# Patient Record
Sex: Female | Born: 1950 | Race: Black or African American | Hispanic: No | Smoking: Never smoker
Health system: Southern US, Community
[De-identification: ages and names within clinical notes are randomized; demographics above are authoritative.]

## PROBLEM LIST (undated history)

## (undated) ENCOUNTER — Emergency Department (HOSPITAL_COMMUNITY): Admission: EM | Payer: PRIVATE HEALTH INSURANCE | Source: Home / Self Care

## (undated) DIAGNOSIS — K2270 Barrett's esophagus without dysplasia: Secondary | ICD-10-CM

## (undated) DIAGNOSIS — K219 Gastro-esophageal reflux disease without esophagitis: Secondary | ICD-10-CM

## (undated) DIAGNOSIS — I5020 Unspecified systolic (congestive) heart failure: Secondary | ICD-10-CM

## (undated) DIAGNOSIS — K209 Esophagitis, unspecified: Secondary | ICD-10-CM

## (undated) DIAGNOSIS — Z8489 Family history of other specified conditions: Secondary | ICD-10-CM

## (undated) DIAGNOSIS — I10 Essential (primary) hypertension: Secondary | ICD-10-CM

## (undated) DIAGNOSIS — I255 Ischemic cardiomyopathy: Secondary | ICD-10-CM

## (undated) DIAGNOSIS — D589 Hereditary hemolytic anemia, unspecified: Secondary | ICD-10-CM

## (undated) DIAGNOSIS — N393 Stress incontinence (female) (male): Secondary | ICD-10-CM

## (undated) DIAGNOSIS — E785 Hyperlipidemia, unspecified: Secondary | ICD-10-CM

## (undated) DIAGNOSIS — I469 Cardiac arrest, cause unspecified: Secondary | ICD-10-CM

## (undated) DIAGNOSIS — I219 Acute myocardial infarction, unspecified: Secondary | ICD-10-CM

## (undated) DIAGNOSIS — G90529 Complex regional pain syndrome I of unspecified lower limb: Secondary | ICD-10-CM

## (undated) DIAGNOSIS — Z9109 Other allergy status, other than to drugs and biological substances: Secondary | ICD-10-CM

## (undated) DIAGNOSIS — M1710 Unilateral primary osteoarthritis, unspecified knee: Secondary | ICD-10-CM

## (undated) DIAGNOSIS — I2510 Atherosclerotic heart disease of native coronary artery without angina pectoris: Secondary | ICD-10-CM

## (undated) HISTORY — PX: OTHER SURGICAL HISTORY: SHX169

## (undated) HISTORY — PX: TONSILLECTOMY: SUR1361

## (undated) HISTORY — PX: APPENDECTOMY: SHX54

---

## 1997-06-07 HISTORY — PX: ABDOMINAL HYSTERECTOMY: SHX81

## 2000-03-07 ENCOUNTER — Emergency Department (HOSPITAL_COMMUNITY): Admission: EM | Admit: 2000-03-07 | Discharge: 2000-03-07 | Payer: Self-pay | Admitting: Emergency Medicine

## 2000-03-07 ENCOUNTER — Encounter: Payer: Self-pay | Admitting: Emergency Medicine

## 2000-05-14 ENCOUNTER — Emergency Department (HOSPITAL_COMMUNITY): Admission: EM | Admit: 2000-05-14 | Discharge: 2000-05-14 | Payer: Self-pay | Admitting: Emergency Medicine

## 2000-05-14 ENCOUNTER — Encounter: Payer: Self-pay | Admitting: Emergency Medicine

## 2000-06-05 ENCOUNTER — Inpatient Hospital Stay (HOSPITAL_COMMUNITY): Admission: EM | Admit: 2000-06-05 | Discharge: 2000-06-06 | Payer: Self-pay

## 2000-06-05 ENCOUNTER — Encounter: Payer: Self-pay | Admitting: Emergency Medicine

## 2000-12-03 ENCOUNTER — Emergency Department (HOSPITAL_COMMUNITY): Admission: EM | Admit: 2000-12-03 | Discharge: 2000-12-03 | Payer: Self-pay | Admitting: Emergency Medicine

## 2001-06-16 ENCOUNTER — Emergency Department (HOSPITAL_COMMUNITY): Admission: EM | Admit: 2001-06-16 | Discharge: 2001-06-16 | Payer: Self-pay

## 2002-02-14 ENCOUNTER — Emergency Department (HOSPITAL_COMMUNITY): Admission: EM | Admit: 2002-02-14 | Discharge: 2002-02-14 | Payer: Self-pay | Admitting: Emergency Medicine

## 2003-01-27 ENCOUNTER — Encounter: Payer: Self-pay | Admitting: Emergency Medicine

## 2003-01-27 ENCOUNTER — Emergency Department (HOSPITAL_COMMUNITY): Admission: EM | Admit: 2003-01-27 | Discharge: 2003-01-27 | Payer: Self-pay | Admitting: Emergency Medicine

## 2003-03-02 ENCOUNTER — Encounter: Payer: Self-pay | Admitting: Emergency Medicine

## 2003-03-02 ENCOUNTER — Emergency Department (HOSPITAL_COMMUNITY): Admission: EM | Admit: 2003-03-02 | Discharge: 2003-03-02 | Payer: Self-pay | Admitting: Emergency Medicine

## 2003-08-05 ENCOUNTER — Encounter: Admission: RE | Admit: 2003-08-05 | Discharge: 2003-08-05 | Payer: Self-pay | Admitting: Obstetrics

## 2003-11-02 ENCOUNTER — Emergency Department (HOSPITAL_COMMUNITY): Admission: EM | Admit: 2003-11-02 | Discharge: 2003-11-02 | Payer: Self-pay | Admitting: Family Medicine

## 2003-12-20 ENCOUNTER — Emergency Department (HOSPITAL_COMMUNITY): Admission: EM | Admit: 2003-12-20 | Discharge: 2003-12-20 | Payer: Self-pay | Admitting: Family Medicine

## 2004-07-02 ENCOUNTER — Ambulatory Visit: Payer: Self-pay | Admitting: Family Medicine

## 2004-08-08 ENCOUNTER — Emergency Department (HOSPITAL_COMMUNITY): Admission: EM | Admit: 2004-08-08 | Discharge: 2004-08-08 | Payer: Self-pay | Admitting: Emergency Medicine

## 2004-08-11 ENCOUNTER — Ambulatory Visit: Payer: Self-pay | Admitting: Family Medicine

## 2004-12-01 ENCOUNTER — Encounter: Admission: RE | Admit: 2004-12-01 | Discharge: 2004-12-01 | Payer: Self-pay | Admitting: Obstetrics and Gynecology

## 2007-10-24 ENCOUNTER — Encounter: Admission: RE | Admit: 2007-10-24 | Discharge: 2007-10-24 | Payer: Self-pay | Admitting: Obstetrics and Gynecology

## 2008-05-12 ENCOUNTER — Emergency Department (HOSPITAL_BASED_OUTPATIENT_CLINIC_OR_DEPARTMENT_OTHER): Admission: EM | Admit: 2008-05-12 | Discharge: 2008-05-12 | Payer: Self-pay | Admitting: Emergency Medicine

## 2008-05-13 ENCOUNTER — Ambulatory Visit: Payer: Self-pay | Admitting: Diagnostic Radiology

## 2008-05-13 ENCOUNTER — Ambulatory Visit (HOSPITAL_BASED_OUTPATIENT_CLINIC_OR_DEPARTMENT_OTHER): Admission: RE | Admit: 2008-05-13 | Discharge: 2008-05-13 | Payer: Self-pay | Admitting: Emergency Medicine

## 2009-05-25 ENCOUNTER — Emergency Department (HOSPITAL_BASED_OUTPATIENT_CLINIC_OR_DEPARTMENT_OTHER): Admission: EM | Admit: 2009-05-25 | Discharge: 2009-05-25 | Payer: Self-pay | Admitting: Emergency Medicine

## 2009-06-19 ENCOUNTER — Ambulatory Visit: Payer: Self-pay | Admitting: Diagnostic Radiology

## 2009-06-19 ENCOUNTER — Emergency Department (HOSPITAL_BASED_OUTPATIENT_CLINIC_OR_DEPARTMENT_OTHER): Admission: EM | Admit: 2009-06-19 | Discharge: 2009-06-19 | Payer: Self-pay | Admitting: Emergency Medicine

## 2009-11-06 ENCOUNTER — Encounter: Admission: RE | Admit: 2009-11-06 | Discharge: 2009-11-06 | Payer: Self-pay | Admitting: Family Medicine

## 2010-01-26 ENCOUNTER — Emergency Department (HOSPITAL_BASED_OUTPATIENT_CLINIC_OR_DEPARTMENT_OTHER): Admission: EM | Admit: 2010-01-26 | Discharge: 2010-01-26 | Payer: Self-pay | Admitting: Emergency Medicine

## 2010-04-07 HISTORY — PX: CHOLECYSTECTOMY: SHX55

## 2010-04-13 ENCOUNTER — Emergency Department (HOSPITAL_BASED_OUTPATIENT_CLINIC_OR_DEPARTMENT_OTHER): Admission: EM | Admit: 2010-04-13 | Discharge: 2010-04-13 | Payer: Self-pay | Admitting: Emergency Medicine

## 2010-04-13 ENCOUNTER — Ambulatory Visit: Payer: Self-pay | Admitting: Diagnostic Radiology

## 2010-04-23 ENCOUNTER — Ambulatory Visit (HOSPITAL_COMMUNITY)
Admission: RE | Admit: 2010-04-23 | Discharge: 2010-04-24 | Payer: Self-pay | Source: Home / Self Care | Admitting: Surgery

## 2010-04-23 ENCOUNTER — Encounter (INDEPENDENT_AMBULATORY_CARE_PROVIDER_SITE_OTHER): Payer: Self-pay | Admitting: Surgery

## 2010-06-28 ENCOUNTER — Encounter: Payer: Self-pay | Admitting: Family Medicine

## 2010-06-28 ENCOUNTER — Encounter: Payer: Self-pay | Admitting: *Deleted

## 2010-06-28 ENCOUNTER — Encounter: Payer: Self-pay | Admitting: Obstetrics and Gynecology

## 2010-08-08 ENCOUNTER — Emergency Department (HOSPITAL_COMMUNITY)
Admission: EM | Admit: 2010-08-08 | Discharge: 2010-08-08 | Disposition: A | Discharge status: Home / Self Care | Payer: PRIVATE HEALTH INSURANCE | Attending: Emergency Medicine | Admitting: Emergency Medicine

## 2010-08-08 ENCOUNTER — Emergency Department (HOSPITAL_COMMUNITY): Payer: PRIVATE HEALTH INSURANCE

## 2010-08-08 DIAGNOSIS — I10 Essential (primary) hypertension: Secondary | ICD-10-CM | POA: Insufficient documentation

## 2010-08-08 DIAGNOSIS — K219 Gastro-esophageal reflux disease without esophagitis: Secondary | ICD-10-CM | POA: Insufficient documentation

## 2010-08-08 DIAGNOSIS — R0789 Other chest pain: Secondary | ICD-10-CM | POA: Insufficient documentation

## 2010-08-08 LAB — COMPREHENSIVE METABOLIC PANEL
AST: 16 U/L (ref 0–37)
Alkaline Phosphatase: 91 U/L (ref 39–117)
BUN: 12 mg/dL (ref 6–23)
GFR calc Af Amer: >60 mL/min (ref >60)
GFR calc non Af Amer: >60 mL/min (ref >60)
Glucose, Bld: 89 mg/dL (ref 70–99)
Potassium: 3.5 mEq/L (ref 3.5–5.1)
Total Bilirubin: 1 mg/dL (ref 0.3–1.2)

## 2010-08-08 LAB — POCT CARDIAC MARKERS
Myoglobin, poc: 45.1 ng/mL (ref 12–200)
Myoglobin, poc: 43.4 ng/mL (ref 12–200)
Troponin i, poc: <0.05 ng/mL (ref 0.00–0.09)

## 2010-08-08 LAB — CBC
HCT: 33.1 % — ABNORMAL LOW (ref 36.0–46.0)
Hemoglobin: 10.6 g/dL — ABNORMAL LOW (ref 12.0–15.0)
MCH: 28.2 pg (ref 26.0–34.0)
MCHC: 32 g/dL (ref 30.0–36.0)

## 2010-08-18 LAB — DIFFERENTIAL
Basophils Relative: 0 % (ref 0–1)
Eosinophils Relative: 1 % (ref 0–5)
Eosinophils Relative: 1 % (ref 0–5)
Lymphocytes Relative: 33 % (ref 12–46)
Lymphs Abs: 1.4 10*3/uL (ref 0.7–4.0)
Monocytes Absolute: 0.3 10*3/uL (ref 0.1–1.0)
Neutro Abs: 4.3 10*3/uL (ref 1.7–7.7)

## 2010-08-18 LAB — CBC
Hemoglobin: 11 g/dL — ABNORMAL LOW (ref 12.0–15.0)
MCH: 29.5 pg (ref 26.0–34.0)
MCHC: 34.2 g/dL (ref 30.0–36.0)
Platelets: 222 10*3/uL (ref 150–400)
Platelets: 238 10*3/uL (ref 150–400)
RBC: 3.49 MIL/uL — ABNORMAL LOW (ref 3.87–5.11)
RDW: 13 % (ref 11.5–15.5)
RDW: 14.3 % (ref 11.5–15.5)

## 2010-08-18 LAB — SURGICAL PCR SCREEN: MRSA, PCR: NEGATIVE

## 2010-08-18 LAB — COMPREHENSIVE METABOLIC PANEL
AST: 16 U/L (ref 0–37)
CO2: 26 mEq/L (ref 19–32)
CO2: 29 mEq/L (ref 19–32)
Calcium: 9.7 mg/dL (ref 8.4–10.5)
Calcium: 9.4 mg/dL (ref 8.4–10.5)
Chloride: 109 mEq/L (ref 96–112)
Creatinine, Ser: 0.8 mg/dL (ref 0.4–1.2)
GFR calc Af Amer: >60 mL/min (ref >60)
GFR calc Af Amer: >60 mL/min (ref >60)
GFR calc non Af Amer: >60 mL/min (ref >60)
GFR calc non Af Amer: >60 mL/min (ref >60)
Potassium: 3.5 mEq/L (ref 3.5–5.1)
Sodium: 148 mEq/L — ABNORMAL HIGH (ref 135–145)
Total Bilirubin: 0.6 mg/dL (ref 0.3–1.2)
Total Protein: 8.2 g/dL (ref 6.0–8.3)

## 2010-08-18 LAB — LIPASE, BLOOD: Lipase: 164 U/L (ref 23–300)

## 2010-08-21 LAB — COMPREHENSIVE METABOLIC PANEL
Alkaline Phosphatase: 132 U/L — ABNORMAL HIGH (ref 39–117)
BUN: 17 mg/dL (ref 6–23)
CO2: 30 mEq/L (ref 19–32)
Chloride: 106 mEq/L (ref 96–112)
Glucose, Bld: 130 mg/dL — ABNORMAL HIGH (ref 70–99)
Potassium: 3.2 mEq/L — ABNORMAL LOW (ref 3.5–5.1)
Total Bilirubin: 0.7 mg/dL (ref 0.3–1.2)

## 2010-08-21 LAB — DIFFERENTIAL
Basophils Absolute: 0 10*3/uL (ref 0.0–0.1)
Basophils Relative: 0 % (ref 0–1)
Neutro Abs: 3.9 10*3/uL (ref 1.7–7.7)
Neutrophils Relative %: 61 % (ref 43–77)

## 2010-08-21 LAB — CBC
HCT: 31.9 % — ABNORMAL LOW (ref 36.0–46.0)
MCV: 86.7 fL (ref 78.0–100.0)
RBC: 3.68 MIL/uL — ABNORMAL LOW (ref 3.87–5.11)
WBC: 6.4 10*3/uL (ref 4.0–10.5)

## 2010-08-23 LAB — COMPREHENSIVE METABOLIC PANEL
ALT: 24 U/L (ref 0–35)
AST: 25 U/L (ref 0–37)
Albumin: 4.6 g/dL (ref 3.5–5.2)
CO2: 29 mEq/L (ref 19–32)
Calcium: 9.7 mg/dL (ref 8.4–10.5)
Chloride: 104 mEq/L (ref 96–112)
GFR calc Af Amer: >60 mL/min (ref >60)
GFR calc non Af Amer: >60 mL/min (ref >60)
Sodium: 142 mEq/L (ref 135–145)

## 2010-08-23 LAB — CBC
MCHC: 34.2 g/dL (ref 30.0–36.0)
Platelets: 275 10*3/uL (ref 150–400)
RBC: 3.77 MIL/uL — ABNORMAL LOW (ref 3.87–5.11)
WBC: 6.1 10*3/uL (ref 4.0–10.5)

## 2010-08-23 LAB — POCT CARDIAC MARKERS
CKMB, poc: 2.4 ng/mL (ref 1.0–8.0)
CKMB, poc: 2.8 ng/mL (ref 1.0–8.0)
Myoglobin, poc: 40.6 ng/mL (ref 12–200)
Troponin i, poc: 0.07 ng/mL (ref 0.00–0.09)
Troponin i, poc: <0.05 ng/mL (ref 0.00–0.09)

## 2010-08-23 LAB — D-DIMER, QUANTITATIVE: D-Dimer, Quant: 0.3 ug/mL-FEU (ref 0.00–0.48)

## 2010-08-23 LAB — DIFFERENTIAL
Eosinophils Absolute: 0.1 10*3/uL (ref 0.0–0.7)
Eosinophils Relative: 2 % (ref 0–5)
Lymphs Abs: 2.1 10*3/uL (ref 0.7–4.0)
Monocytes Absolute: 0.5 10*3/uL (ref 0.1–1.0)

## 2010-10-23 NOTE — Discharge Summary (Signed)
[place]  Patient:    Wendy Reid, Wendy Reid                   MRN: [id number] Adm. Date:  [id number] Disc. Date: [id number] Attending:  Farley Ly Dictator:   Susie Cassette, M.D. CC:         Wendy Reid, M.D.  Internal Medicine Clinic   Discharge Summary  DISCHARGE DIAGNOSES: 1. Chest pain, ruled out for myocardial infarction by enzymes and    electrocardiogram.  Likely secondary to recent upper respiratory tract    infection and coughing. 2. Anemia. 3. Hypertension. 4. Gastroesophageal reflux disease.  DISCHARGE MEDICATIONS: 1. Estrogen 0.625 mg p.o. q.d. 2. Aspirin 325 mg p.o. q.d. 3. Calan 240 mg p.o. q.d. 4. Nitroglycerin 0.4 mg sublingual every five minutes x 3 p.r.n. chest pain.  FOLLOW-UP APPOINTMENT:  1. Wendy Reid was instructed to follow up with the internal medicine outpatient clinic.  She was instructed to call as the clinics were closed on the day of discharge and get an appointment early the week of June 13, 2000, or June 14, 2000.  2. Wendy Reid was also given the number to call to set up a cardiology outpatient visit, as well as an outpatient Cardiolite.  This clinic was closed as well, so she was given the numbers to call to set those appointments.  PROCEDURES:  None.  CONSULTATIONS:  [facility] on June 06, 2000.  CHIEF COMPLAINT:  Chest pressure.  HISTORY OF PRESENT ILLNESS:  This is a 60 year old black female with a past medical history of hypertension and anemia, who presented with a two-day history of arm pressure and a one-day history of chest pressure. Wendy Reid does describe a past episode of chest pressure that was much more severe than this episode.  At that time, she was found to be anemic secondary to increased menstrual bleeding.  She had a hysterectomy done at that time and the problem was considered to be resolved.  She had  also had a catheterization at that time that was negative per the patient.  On Friday, she began having pressure in her left arm only.  On Saturday, the pressure was also in her chest.  She described it as beginning at her sternum and moving around her side towards the back.  She states that the pain came on suddenly and was not relieved with rest.  She denies frank pain, as well as shortness of breath, nausea, vomiting, and diaphoresis associated with the pressure. She describes the pressure as a 5/10 that waxed and waned somewhat, but was never completely resolved.  Her pressure in the ER was at a level of 0/10. She did receive nitroglycerin x 3, but was unsure of any symptomatic relief. She does report a slight increase in symptoms with deep breathing and denies any point tenderness.  PAST MEDICAL HISTORY:  1. Hypertension.  2. Gastroesophageal reflux disease. 3. Anemia.  4. Euthyroid with a questionable history of goiter.  PAST SURGICAL HISTORY:  1. Tonsillectomy.  2. Appendectomy.  3. Hysterectomy. 4. Knee surgery.  MEDICATIONS: 1. Estrogen 0.625 mg q.d. 2. Calan 240 mg p.o. q.d. 3. Albuterol p.r.n.  ALLERGIES:  No known drug allergies.  REVIEW OF SYSTEMS:  Positive for a cough since Thanksgiving with yellow phlegm that is now resolved.  Negative for visual or hearing changes, shortness of breath, palpitations, PND, orthopnea, nausea, vomiting, diarrhea, constipation, melena, dysuria, bright red blood per rectum, and known  hypercholesterolemia.  SOCIAL HISTORY:  Wendy [place] in [place]  She is widowed with four children, three boys and a girl.  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  She denies tobacco, alcohol, or illicit drug use.  She does not report any notable increase in life stressors.  FAMILY MEDICAL HISTORY:  Her mom died at 53 from an MI.  She had diabetes and hypertension.  Dad passed  away of a respiratory arrest secondary to trauma in his late 45s.  Siblings:  One brother with diabetes.  PHYSICAL EXAMINATION:  VITAL SIGNS:  Temperature 98.1 degrees, BP 148/86, pulse 83, respiratory rate 16, saturations 99%.  GENERAL APPEARANCE:  This is an obese black female in no acute distress.  HEENT:  Pupils are equally round and reactive to light.  Extraocular muscles intact.  Oropharynx clear without erythema.  Mucous membranes moist.  NECK:  No JVD.  No carotid bruits.  CARDIOVASCULAR:  Regular rate and rhythm.  No murmurs, rubs, or gallops.  LUNGS:  Clear to auscultation bilaterally.  No wheezes, crackles, or rhonchi.  ABDOMEN:  Bowel sounds present.  Soft, nondistended, nontender.  EXTREMITIES:  No edema in the lower extremities bilaterally.  Dorsalis pedis pulses 2+ bilaterally.  NEUROLOGIC:  No focal deficits.  Moves all extremities well.  RECTAL:  Stool in vault, heme negative.  ADMISSION LABORATORIES:  White blood cell count 5.4, hemoglobin 11.2, hematocrit 32.2, MCV 81.1, platelets 269.  CK 206, MB 2.0, troponin I less than 0.01.  PT 29, PTT 11.6, INR 0.8.  BMP:  Sodium 141, potassium 3.6, chloride 106, bicarbonate 24, BUN 16, creatinine 0.8, glucose 99, calcium 9.1.  Chest x-ray:  No effusion or infiltrate.  No acute disease.  EKG:  Normal sinus rhythm without ST-T wave changes.  HOSPITAL COURSE: #1 - CHEST PAIN:  Wendy Reid was admitted for observation and to rule out acute MI with serial enzymes and EKG.  Her enzyme values were negative with CKs of 206, 180, and 185, MBs of 2.0, 1.6, and 1.3, and troponin Is of less than 0.1 x 3.  In addition, her EKG showed no acute ST-T wave changes. Therefore, she was ruled out for MI.  She was managed overnight with Darvocet as needed for chest pain, as well as nitroglycerin.  She was started on aspirin and Lopressor, as well as Robitussin to decrease her symptoms related  to coughing.  It was felt that Ms.  Reid was likely not having an acute MI, but this was likely secondary to a recent upper respiratory infection with bouts of severe coughing that had led to some type of muscle strain and was causing her chest pressure.  However, because of her history of hypertension, as well as her family history of mom dying at 39 from MI, a cardiology consult was called in the morning.  Cardiology did see Wendy Reid and agreed with the work-up of this chest pressure as an outpatient.  Wendy Reid was given the number to schedule an outpatient Cardiolite, as well as an office visit.  She was discharged on an aspirin a day, as well as sublingual nitroglycerin in the event of chest pain.  #2 - HYPERTENSION:  Wendy Reid actually fairly well controlled on admission with a blood pressure of 148/86.  While in the hospital, she was managed with Lopressor 25 mg b.i.d.  However, on discharge, she was returned to her home regimen of Calan 240 mg q.d. as  an agent with better efficacy when used as a single agent.  However, per discussion with cardiology, if anything is found on the outpatient Cardiolite, a beta blocker will be considered for its morbidity and mortality benefits.  #3 - HYPERCHOLESTEROLEMIA:  A lipid panel was checked while Wendy Reid was in the hospital.  Her total cholesterol was noted to be 235, HDL 61, and LDL 153.  Per discussion with the patient, she prefers at least to try diet therapy prior to being started on a statin.  We discussed that she will try this for approximately three months, at which time she can have a cholesterol panel rechecked and further evaluation and treatment will be based on those results.  #4 - ANEMIA:  Wendy Reid reported that her anemia is of longstanding origin.  She states that she has been worked up extensively in the past with tests for diseases, including sickle cell, thalassemia, and G6PD.  She does report that her son has  G6PD.  Initial labs were checked while in house and revealed no abnormal morphology on a smear.  Her hemoglobin was stable while in the hospital and may need further work-up from her primary care M.D. as an outpatient.  However, her anemia is mild and has been worked up extensively in the past, per patient, so this may be something for Wendy Reid to discuss with her primary care physician and decided upon further evaluation and treatment.  #5 - HISTORY OF QUESTIONABLE GOITER:  Wendy Reid thyroid was tested while in the hospital and she is currently euthyroid.  So no further evaluation and treatment is needed.  DISCHARGE LABORATORIES:  CBC with white count 3.9, hemoglobin 11.4, MCV 81.8, platelets 258.  Sodium 138, potassium 3.5, chloride 105, bicarbonate 28, BUN 9, creatinine 0.8, glucose 98, calcium 8.9.  Smear with no abnormal morphology.  CK 185, MB 1.3, troponin I less than 0.01.  T4 1.14, TSH 0.893. Ferritin 94, B12 340.  Lipids with total cholesterol 235, triglycerides 105, HDL 61, and LDL 153.  EKG with normal sinus rhythm with no acute ST-T wave changes and question of slightly upsloping STs in V2 and V3.  DISPOSITION:  Wendy Reid was discharged home in good condition. DD:  06/10/00 TD:  06/10/00 Job: [id number]

## 2010-12-20 ENCOUNTER — Emergency Department (INDEPENDENT_AMBULATORY_CARE_PROVIDER_SITE_OTHER): Payer: PRIVATE HEALTH INSURANCE

## 2010-12-20 ENCOUNTER — Emergency Department (HOSPITAL_BASED_OUTPATIENT_CLINIC_OR_DEPARTMENT_OTHER)
Admission: EM | Admit: 2010-12-20 | Discharge: 2010-12-20 | Disposition: A | Discharge status: Home / Self Care | Payer: PRIVATE HEALTH INSURANCE | Attending: Emergency Medicine | Admitting: Emergency Medicine

## 2010-12-20 ENCOUNTER — Other Ambulatory Visit: Payer: Self-pay

## 2010-12-20 ENCOUNTER — Encounter: Payer: Self-pay | Admitting: *Deleted

## 2010-12-20 DIAGNOSIS — R079 Chest pain, unspecified: Secondary | ICD-10-CM

## 2010-12-20 DIAGNOSIS — R0789 Other chest pain: Secondary | ICD-10-CM | POA: Insufficient documentation

## 2010-12-20 LAB — COMPREHENSIVE METABOLIC PANEL
AST: 16 U/L (ref 0–37)
Albumin: 4.3 g/dL (ref 3.5–5.2)
Alkaline Phosphatase: 98 U/L (ref 39–117)
BUN: 14 mg/dL (ref 6–23)
Chloride: 104 mEq/L (ref 96–112)
Potassium: 3.5 mEq/L (ref 3.5–5.1)
Total Bilirubin: 0.6 mg/dL (ref 0.3–1.2)
Total Protein: 9.2 g/dL — ABNORMAL HIGH (ref 6.0–8.3)

## 2010-12-20 LAB — CBC
Hemoglobin: 10.9 g/dL — ABNORMAL LOW (ref 12.0–15.0)
MCH: 28.5 pg (ref 26.0–34.0)
MCHC: 33.5 g/dL (ref 30.0–36.0)
Platelets: 236 10*3/uL (ref 150–400)
RDW: 13.1 % (ref 11.5–15.5)

## 2010-12-20 LAB — DIFFERENTIAL
Basophils Absolute: 0 10*3/uL (ref 0.0–0.1)
Basophils Relative: 0 % (ref 0–1)
Eosinophils Absolute: 0.1 10*3/uL (ref 0.0–0.7)
Neutro Abs: 2.9 10*3/uL (ref 1.7–7.7)
Neutrophils Relative %: 62 % (ref 43–77)

## 2010-12-20 LAB — TROPONIN I: Troponin I: <0.3 ng/mL (ref <0.30)

## 2010-12-20 LAB — CK TOTAL AND CKMB (NOT AT ARMC)
CK, MB: 2.9 ng/mL (ref 0.3–4.0)
Total CK: 179 U/L — ABNORMAL HIGH (ref 7–177)

## 2010-12-20 MED ORDER — IBUPROFEN 800 MG PO TABS: 800.0000 mg | ORAL_TABLET | Freq: Three times a day (TID) | ORAL | Status: AC

## 2010-12-20 MED ORDER — ASPIRIN 81 MG PO CHEW
324.0000 mg | CHEWABLE_TABLET | Freq: Once | ORAL | Status: AC
  Administered 2010-12-20: 324 mg via ORAL
  Filled 2010-12-20: qty 4

## 2010-12-20 NOTE — ED Notes (Signed)
Patient states she started having L side chest pain/pressure at approx 3 am this morning and pain that radiates down Left arm, took mylanta no relief

## 2010-12-20 NOTE — ED Provider Notes (Addendum)
History     Chief Complaint  Patient presents with  . Chest Pain   HPI Comments: Cath was 3 years ago.  Patient is a 60 y.o. female presenting with chest pain. The history is provided by the patient.  Chest Pain The chest pain began 12 - 24 hours ago. Duration of episode(s) is 16 hours. Chest pain occurs constantly. The chest pain is unchanged. At its most intense, the pain is at 3/10. The pain is currently at 3/10. The severity of the pain is mild. The quality of the pain is described as tightness. The pain radiates to the left arm. Pertinent negatives for primary symptoms include no fever, no fatigue, no syncope, no shortness of breath, no palpitations and no nausea.  Pertinent negatives for associated symptoms include no diaphoresis. She tried antacids for the symptoms.  Her past medical history is significant for hypertension.  Her family medical history is significant for CAD in family.  Procedure history is positive for cardiac catheterization.     Past Medical History  Diagnosis Date  . Hiatal hernia   . Barrett's syndrome     Past Surgical History  Procedure Date  . Cholecystectomy   . Appendectomy   . Abdominal hysterectomy   . Tonsillectomy     No family history on file.  History  Substance Use Topics  . Smoking status: Never Smoker   . Smokeless tobacco: Not on file  . Alcohol Use: No    OB History    Grav Para Term Preterm Abortions TAB SAB Ect Mult Living                  Review of Systems  Constitutional: Negative for fever, diaphoresis and fatigue.  Respiratory: Negative for shortness of breath.   Cardiovascular: Positive for chest pain. Negative for palpitations and syncope.  Gastrointestinal: Negative for nausea.    Physical Exam  BP 152/101  Pulse 70  Temp(Src) 98.9 F (37.2 C) (Oral)  Resp 18  SpO2 98%  Physical Exam  Constitutional: She is oriented to person, place, and time. She appears well-developed and well-nourished.  HENT:    Head: Normocephalic and atraumatic.  Eyes: Pupils are equal, round, and reactive to light.  Neck: Normal range of motion. Neck supple.  Cardiovascular: Normal rate, regular rhythm and normal heart sounds.  Exam reveals no gallop and no friction rub.   No murmur heard. Pulmonary/Chest: No respiratory distress. She has no wheezes. She has no rales. She exhibits no tenderness.  Abdominal: Soft. Bowel sounds are normal. She exhibits no distension. There is no tenderness.  Musculoskeletal: Normal range of motion. She exhibits no edema and no tenderness.  Neurological: She is alert and oriented to person, place, and time.  Skin: Skin is warm and dry.    ED Course  Procedures  MDM EKG shows sinus rhythm 73 bpm  Date: 03/06/2011  Rate: 73  Rhythm: normal sinus rhythm  QRS Axis: normal  Intervals: normal  ST/T Wave abnormalities: normal  Conduction Disutrbances:none  Narrative Interpretation:   Old EKG Reviewed: none available  .  No change from priors.  Will discharge.  Normal cath in recent past.      Geoffery Lyons 12/28/10 0818  Geoffery Lyons, MD 01/26/11 7829  Geoffery Lyons, MD 03/06/11 [phone]

## 2010-12-20 NOTE — ED Notes (Signed)
Patient is resting comfortably. 

## 2010-12-20 NOTE — ED Notes (Signed)
Patient denies pain and is resting comfortably.  

## 2010-12-20 NOTE — Discharge Instructions (Signed)
Chest Pain (Nonspecific) It is often hard to give a specific diagnosis for the cause of chest pain. There is always a chance that your pain could be related to something serious, such as a heart attack or a blood clot in the lungs. You need to follow up with your caregiver for further evaluation. CAUSES  Heartburn.   Pneumonia or bronchitis.   Anxiety and stress.   Inflammation around your heart (pericarditis) or lung (pleuritis or pleurisy).   A blood clot in the lung.   A collapsed lung (pneumothorax). It can develop suddenly on its own (spontaneous pneumothorax) or from injury (trauma) to the chest.  The chest wall is composed of bones, muscles, and cartilage. Any of these can be the source of the pain.  The bones can be bruised by injury.   The muscles or cartilage can be strained by coughing or overwork.   The cartilage can be affected by inflammation and become sore (costochondritis).  DIAGNOSIS Lab tests or other studies, such as X-rays, an EKG, stress testing, or cardiac imaging, may be needed to find the cause of your pain.  TREATMENT  Treatment depends on what may be causing your chest pain. Treatment may include:   Acid blockers for heartburn.  Anti-inflammatory medicine.   Pain medicine for inflammatory conditions.  Antibiotics if an infection is present.    You may be advised to change lifestyle habits. This includes stopping smoking and avoiding caffeine and chocolate.   You may be advised to keep your head raised (elevated) when sleeping. This reduces the chance of acid going backward from your stomach into your esophagus.   Most of the time, nonspecific chest pain will improve within 2 to 3 days with rest and mild pain medicine.  HOME CARE INSTRUCTIONS  If antibiotics were prescribed, take the full amount even if you start to feel better.   For the next few days, avoid physical activities that bring on chest pain. Continue physical activities as directed.     Do not smoke cigarettes or drink alcohol until your symptoms are gone.   Only take over-the-counter or prescription medicine for pain, discomfort, or fever as directed by your caregiver.   Follow your caregiver's suggestions for further testing if your chest pain does not go away.   Keep any follow-up appointments you made. If you do not go to an appointment, you could develop lasting (chronic) problems with pain. If there is any problem keeping an appointment, you must call to reschedule.  SEEK MEDICAL CARE IF:  You think you are having problems from the medicine you are taking. Read your medicine instructions carefully.   Your chest pain does not go away, even after treatment.   You develop a rash with blisters on your chest.  SEEK IMMEDIATE MEDICAL CARE IF:  You have increased chest pain or pain that spreads to your arm, neck, jaw, back, or belly (abdomen).   You develop shortness of breath, an increasing cough, or you are coughing up blood.   You have severe back or abdominal pain, feel sick to your stomach (nauseous) or throw up (vomit).   You develop severe weakness, fainting, or chills.   You have an oral temperature above 103, not controlled by medicine.  THIS IS AN EMERGENCY. Do not wait to see if the pain will go away. Get medical help at once. Call your local emergency services  (911 in [place] Do not drive yourself to the hospital. MAKE SURE YOU:  Understand these  instructions.   Will watch your condition.   Will get help right away if you are not doing well or get worse.  Document Released: 03/03/2005 Document Re-Released: 08/18/2009 [facility] Patient Information 2011 [place], [place].

## 2010-12-20 NOTE — ED Notes (Signed)
Vital signs stable. 

## 2010-12-20 NOTE — ED Notes (Signed)
Family at bedside. 

## 2011-03-12 LAB — DIFFERENTIAL
Basophils Absolute: 0 10*3/uL (ref 0.0–0.1)
Basophils Relative: 0 % (ref 0–1)
Eosinophils Absolute: 0.2 10*3/uL (ref 0.0–0.7)
Eosinophils Relative: 2 % (ref 0–5)
Lymphocytes Relative: 28 % (ref 12–46)
Lymphs Abs: 1.9 10*3/uL (ref 0.7–4.0)
Monocytes Absolute: 0.4 10*3/uL (ref 0.1–1.0)
Monocytes Relative: 5 % (ref 3–12)
Neutro Abs: 4.4 10*3/uL (ref 1.7–7.7)
Neutrophils Relative %: 64 % (ref 43–77)

## 2011-03-12 LAB — COMPREHENSIVE METABOLIC PANEL
ALT: 43 U/L — ABNORMAL HIGH (ref 0–35)
AST: 85 U/L — ABNORMAL HIGH (ref 0–37)
Albumin: 4.4 g/dL (ref 3.5–5.2)
CO2: 31 mEq/L (ref 19–32)
Chloride: 105 mEq/L (ref 96–112)
Creatinine, Ser: 0.8 mg/dL (ref 0.4–1.2)
GFR calc Af Amer: >60 mL/min (ref >60)
Potassium: 3.1 mEq/L — ABNORMAL LOW (ref 3.5–5.1)
Sodium: 144 mEq/L (ref 135–145)
Total Bilirubin: 0.9 mg/dL (ref 0.3–1.2)

## 2011-03-12 LAB — URINALYSIS, ROUTINE W REFLEX MICROSCOPIC
Bilirubin Urine: NEGATIVE
Hgb urine dipstick: NEGATIVE
Ketones, ur: NEGATIVE mg/dL
Specific Gravity, Urine: 1.014 (ref 1.005–1.030)
Urobilinogen, UA: 1 mg/dL (ref 0.0–1.0)
pH: 7 (ref 5.0–8.0)

## 2011-03-12 LAB — CBC
HCT: 33 % — ABNORMAL LOW (ref 36.0–46.0)
MCV: 85.3 fL (ref 78.0–100.0)
Platelets: 241 10*3/uL (ref 150–400)
RBC: 3.87 MIL/uL (ref 3.87–5.11)
WBC: 6.9 10*3/uL (ref 4.0–10.5)

## 2011-09-08 ENCOUNTER — Other Ambulatory Visit: Payer: Self-pay | Admitting: Pain Medicine

## 2011-10-19 ENCOUNTER — Encounter (HOSPITAL_COMMUNITY): Payer: Self-pay | Admitting: Pharmacy Technician

## 2011-10-20 ENCOUNTER — Encounter (HOSPITAL_COMMUNITY)
Admission: RE | Admit: 2011-10-20 | Discharge: 2011-10-20 | Disposition: A | Discharge status: Home / Self Care | Payer: PRIVATE HEALTH INSURANCE | Source: Ambulatory Visit | Attending: Specialist | Admitting: Specialist

## 2011-10-20 ENCOUNTER — Encounter (HOSPITAL_COMMUNITY): Payer: Self-pay

## 2011-10-20 HISTORY — DX: Gastro-esophageal reflux disease without esophagitis: K21.9

## 2011-10-20 HISTORY — DX: Essential (primary) hypertension: I10

## 2011-10-20 LAB — CBC
HCT: 31.6 % — ABNORMAL LOW (ref 36.0–46.0)
MCV: 86.3 fL (ref 78.0–100.0)
RBC: 3.66 MIL/uL — ABNORMAL LOW (ref 3.87–5.11)
WBC: 5 10*3/uL (ref 4.0–10.5)

## 2011-10-20 LAB — URINALYSIS, ROUTINE W REFLEX MICROSCOPIC
Glucose, UA: NEGATIVE mg/dL
Ketones, ur: NEGATIVE mg/dL
Leukocytes, UA: NEGATIVE
Nitrite: NEGATIVE
Specific Gravity, Urine: 1.027 (ref 1.005–1.030)
pH: 6 (ref 5.0–8.0)

## 2011-10-20 LAB — COMPREHENSIVE METABOLIC PANEL
AST: 14 U/L (ref 0–37)
Albumin: 3.6 g/dL (ref 3.5–5.2)
Alkaline Phosphatase: 103 U/L (ref 39–117)
Chloride: 103 mEq/L (ref 96–112)
Creatinine, Ser: 0.69 mg/dL (ref 0.50–1.10)
Potassium: 3.5 mEq/L (ref 3.5–5.1)
Total Bilirubin: 0.4 mg/dL (ref 0.3–1.2)
Total Protein: 8.5 g/dL — ABNORMAL HIGH (ref 6.0–8.3)

## 2011-10-20 LAB — DIFFERENTIAL
Basophils Relative: 0 % (ref 0–1)
Lymphs Abs: 1.6 10*3/uL (ref 0.7–4.0)
Monocytes Absolute: 0.3 10*3/uL (ref 0.1–1.0)
Monocytes Relative: 5 % (ref 3–12)
Neutro Abs: 3.1 10*3/uL (ref 1.7–7.7)

## 2011-10-20 LAB — SURGICAL PCR SCREEN
MRSA, PCR: NEGATIVE
Staphylococcus aureus: NEGATIVE

## 2011-10-20 NOTE — Patient Instructions (Signed)
20 Wendy Reid  10/20/2011   Your procedure is scheduled on:  10-28-2012  Report to [facility] at 1000  AM.  Call this number if you have problems the morning of surgery: [phone]   Remember:   Do not eat food or drink liquids:After Midnight.  .  Take these medicines the morning of surgery with A SIP OF WATER: zyrtec if needed, amlodipine, serevent inhaler if needed, xopenex inhaler if needed and bring inhalers, pantaprazole.   Do not wear jewelry or make up.  Do not wear lotions, powders, or perfumes.Do not wear deodorant.    Do not bring valuables to the hospital.  Contacts, dentures or bridgework may not be worn into surgery.  Leave suitcase in the car. After surgery it may be brought to your room.  For patients admitted to the hospital, checkout time is 11:00 AM the day of discharge.     Special Instructions: CHG Shower Use Special Wash: 1/2 bottle night before surgery and 1/2 bottle morning of surgery.neck down avoid private area, no shaving for 2 days before showers.   Please read over the following fact sheets that you were given: MRSA Information, blood fact sheet, incentive spirometer fact sheet  Cain Sieve [facility] pre op nurse phone number [phone], call if needed

## 2011-10-20 NOTE — Progress Notes (Signed)
10/20/11 1256  OBSTRUCTIVE SLEEP APNEA  Have you ever been diagnosed with sleep apnea through a sleep study? No  Do you snore loudly (loud enough to be heard through closed doors)?  1  Do you often feel tired, fatigued, or sleepy during the daytime? 0  Has anyone observed you stop breathing during your sleep? 0  Do you have, or are you being treated for high blood pressure? 1  BMI more than 35 kg/m2? 1  Age over 60 years old? 1  Gender: 0  Obstructive Sleep Apnea Score 4   Score 4 or greater  Updated health history;Results sent to PCP

## 2011-10-20 NOTE — Pre-Procedure Instructions (Addendum)
STRESS TEST [facility] 04-20-2011 ON CHART CARDIAC CLEARANCE NOTE DR CROWELL 1-03-08-12 ON CHART, LAST OFFICE VISIT NOTE 03-29-2011 DR CROWELL ON CHART, ECHO 04-10-2011 [facility] ON CHART. MEDICAL CLEARANCE NOTE DR Reola Calkins 08-07-2011 ON CHARTCHEST 1 VIEW 12-20-2010 EPIC AND ON CHART EKG 03-29-2011 EPIC AND ON CHART

## 2011-10-25 NOTE — H&P (Signed)
NAME:  Reid, Wendy          ACCOUNT NO.:  [id number]  MEDICAL RECORD NO.:  [id number]  LOCATION:                               FACILITY:  [facility]  PHYSICIAN:  Quida Glasser Andrew Collins, M.D.DATE OF BIRTH:  05/12/1951  DATE OF ADMISSION:  10/29/2011 DATE OF DISCHARGE:                             HISTORY & PHYSICAL   ADMISSION DIAGNOSES: 1. End-stage osteoarthritis, bilateral knees; right more symptomatic     than left. 2. Obesity. 3. Hypertension. 4. Gastroesophageal reflux disease. 5. Asthma with spring allergies.  HISTORY OF PRESENT ILLNESS:  The patient is a pleasant 60-year-old female with a long-term existent and advanced osteoarthritis, bilateral knees.  The patient was evaluated and treated by Dr. Collins with arthroscope, injections and conservative other treatments.  The patient progressed to have worsening of her symptoms in bilateral knees, right currently more symptomatic than left.  The patient has requested to have a total knee arthroplasty.  It is quite appropriate for her findings.  X- rays revealed she has end-stage osteoarthritis, bone-on-bone, medial lateral patellofemoral joints bilateral knees, but right is more symptomatic, so we will proceed with that.  The patient has been evaluated and cleared by her primary care physician and the cardiologist, and she is currently going through an extensive workup with the pulmonologist due to her history of asthma, which seems to be worsened with her spring allergies, she has never been hospitalized.  PRIMARY CARE PHYSICIAN:  Dr. Beck.  CARDIOLOGIST:  Dr. Charles Crowell in [place].  PULMONOLOGIST:  Dr. Wayne Beauford in [facility].  ALLERGIES:  VICODIN and ZOFRAN causing extreme nausea.  CURRENT MEDICATIONS: 1. Norvasc 5 mg once a day. 2. Potassium 10 mEq a day. 3. Protonix 40 mg a day. 4. Zyrtec 10 mg a day. 5. Albuterol. 6. Xopenex. 7. Serevent inhalers as needed.  PAST MEDICAL HISTORY:  Review  of medical history includes; 1. Asthma worsened with spring allergies. 2. Reflux disease. 3. Hypertension. 4. Barrett esophagitis. 5. Occasional urinary incontinence.  REVIEW OF SYSTEMS:  NEUROLOGIC:  She denies any strokes, seizures, or convulsions.  No alcohol or drug issues.  RESPIRATORY:  She denies any recent shortness of breath, but she does feel like she is a little tight and congested, she does have issues when she is outside and about with her spring allergies, currently well controlled with her inhalers.  She denies any history of tuberculosis COPD or sleep apnea.  CARDIOVASCULAR: She does have hypertension.  She is on the same medicines for a while. She has had a Lexiscan done in November 2012, which was unremarkable. She denies any recent issues with chest pains, irregular heart rhythms, beating too slow or fast, shortness of breath or PND.  GASTROINTESTINAL: She does have GERD, it is well controlled with her Protonix.  She does have issues related in the past with Barrett esophagitis, no signs of malignancy, followed by Dr. Schrappen I believe at [facility].  She has had her gallbladder removed in the past.  No issues related to jaundice, hepatitis, irritable bowel, or diverticular issues.  GENITOURINARY:  She does have issues related to occasional incontinence, urinary.  She denies any frequent UTI, stones, or difficulty urinating.  ENDOCRINE: She denies any thyroid   or diabetic issues.  HEMATOLOGIC:  She does have a history of anemia in the past and she was on iron.  PAST SURGICAL HISTORY:  Includes; 1. Tonsillectomy. 2. Appendectomy. 3. Hysterectomy. 4. Her knees were scoped several times. 5. She had a cholecystectomy without any problems with anesthesia.  FAMILY MEDICAL HISTORY:  Her father is deceased from respiratory issues, post surgical.  Her mother is deceased from cardiac issues.  SOCIAL HISTORY:  She is widowed.  She has never smoked.  No alcohol or drugs.   She does not have anyone at home to help her.  She would like to go into rehab and she is working on a facility at this time.  PHYSICAL EXAMINATION:  VITAL SIGNS:  Height is 5 feet and 1 inch. Weight is 215 pounds. GENERAL:  The patient is a healthy appearing, heavy-set female, appears to be uncomfortable with ambulating.  She walks with a side-to-side gait with a significant limp. NECK:  Supple.  No palpable lymphadenopathy. LUNGS:  Clear today throughout.  Currently she says she has good breathing today.  No wheezing or rhonchi noted. HEART:  Regular rate and rhythm. ABDOMEN:  Soft.  Bowel sounds present. EXTREMITIES:  She had good range of motion of her upper extremities with excellent motor strength.  Lower extremities; she has good pulses in lower extremities.  She had painful range of motion of both knees.  She lacks about 5 degrees extension and flexes back about 110 degrees.  She had no signs of phlebitis, edema, or venostasis issues in her lower extremities. BREAST:  Deferred. RECTAL:  Deferred. GENITOURINARY:  Deferred.  IMPRESSION: 1. End-stage osteoarthritis, bilateral knees; right more symptomatic     than left. 2. Hypertension. 3. History of asthma with seasonal spring allergies. 4. History of Barrett esophagitis. 5. History of gastroesophageal reflux disease. 6. Obesity.  PLAN:  The patient has been evaluated and cleared by her primary care physician, Dr. Beck; also cleared by her cardiologist.  She does have a recent pulmonary function test through the Anesthesia department prior to her surgery.  She will undergo all routine labs and tests today prior to the surgical procedure.     Allicia Culley W. Kelilah Hebard, P.A.   ______________________________ [name] Andrew Collins, M.D.    RWK/MEDQ  D:  10/20/2011  T:  10/23/2011  Job:  [id number] 

## 2011-10-27 ENCOUNTER — Encounter (HOSPITAL_COMMUNITY): Payer: Self-pay

## 2011-10-27 NOTE — Pre-Procedure Instructions (Signed)
Received call from patient expressing concern that EKG was not done at PST visit- stated had taken some Nasal Chrom last week and had a "few prickly sensations in chest after using spray that were brief" States stopped nasal chrom and has not had any problems since that day. Instructed her if notices this again after stopping med to have evaluated. Copy of stress test and EKG on chart as pre PST nurse note. Verbalizes understanding

## 2011-10-29 ENCOUNTER — Encounter (HOSPITAL_COMMUNITY): Payer: Self-pay | Admitting: Anesthesiology

## 2011-10-29 ENCOUNTER — Encounter (HOSPITAL_COMMUNITY): Payer: Self-pay | Admitting: *Deleted

## 2011-10-29 ENCOUNTER — Ambulatory Visit (HOSPITAL_COMMUNITY): Payer: PRIVATE HEALTH INSURANCE | Admitting: Anesthesiology

## 2011-10-29 ENCOUNTER — Inpatient Hospital Stay (HOSPITAL_COMMUNITY)
Admission: RE | Admit: 2011-10-29 | Discharge: 2011-11-03 | DRG: 470 | Disposition: A | Discharge status: Skilled Nursing Facility | Payer: PRIVATE HEALTH INSURANCE | Source: Ambulatory Visit | Attending: Specialist | Admitting: Specialist

## 2011-10-29 ENCOUNTER — Encounter (HOSPITAL_COMMUNITY)
Admission: RE | Disposition: A | Discharge status: Skilled Nursing Facility | Payer: Self-pay | Source: Ambulatory Visit | Attending: Specialist

## 2011-10-29 DIAGNOSIS — I498 Other specified cardiac arrhythmias: Secondary | ICD-10-CM | POA: Diagnosis not present

## 2011-10-29 DIAGNOSIS — M1710 Unilateral primary osteoarthritis, unspecified knee: Principal | ICD-10-CM | POA: Diagnosis present

## 2011-10-29 DIAGNOSIS — I10 Essential (primary) hypertension: Secondary | ICD-10-CM | POA: Diagnosis present

## 2011-10-29 DIAGNOSIS — N393 Stress incontinence (female) (male): Secondary | ICD-10-CM

## 2011-10-29 DIAGNOSIS — D62 Acute posthemorrhagic anemia: Secondary | ICD-10-CM | POA: Diagnosis not present

## 2011-10-29 DIAGNOSIS — Z9049 Acquired absence of other specified parts of digestive tract: Secondary | ICD-10-CM

## 2011-10-29 DIAGNOSIS — G4730 Sleep apnea, unspecified: Secondary | ICD-10-CM | POA: Diagnosis present

## 2011-10-29 DIAGNOSIS — Z01812 Encounter for preprocedural laboratory examination: Secondary | ICD-10-CM

## 2011-10-29 DIAGNOSIS — Z6841 Body Mass Index (BMI) 40.0 and over, adult: Secondary | ICD-10-CM

## 2011-10-29 DIAGNOSIS — D649 Anemia, unspecified: Secondary | ICD-10-CM

## 2011-10-29 DIAGNOSIS — D589 Hereditary hemolytic anemia, unspecified: Secondary | ICD-10-CM

## 2011-10-29 DIAGNOSIS — K219 Gastro-esophageal reflux disease without esophagitis: Secondary | ICD-10-CM | POA: Diagnosis present

## 2011-10-29 DIAGNOSIS — K2270 Barrett's esophagus without dysplasia: Secondary | ICD-10-CM

## 2011-10-29 DIAGNOSIS — R000 Tachycardia, unspecified: Secondary | ICD-10-CM

## 2011-10-29 DIAGNOSIS — J45909 Unspecified asthma, uncomplicated: Secondary | ICD-10-CM | POA: Diagnosis present

## 2011-10-29 HISTORY — DX: Esophagitis, unspecified: K20.9

## 2011-10-29 HISTORY — DX: Barrett's esophagus without dysplasia: K22.70

## 2011-10-29 HISTORY — PX: TOTAL KNEE ARTHROPLASTY: SHX125

## 2011-10-29 HISTORY — DX: Hereditary hemolytic anemia, unspecified: D58.9

## 2011-10-29 HISTORY — DX: Unilateral primary osteoarthritis, unspecified knee: M17.10

## 2011-10-29 HISTORY — DX: Stress incontinence (female) (male): N39.3

## 2011-10-29 SURGERY — ARTHROPLASTY, KNEE, TOTAL
Anesthesia: Spinal | Site: Knee | Laterality: Right | Wound class: Clean

## 2011-10-29 MED ORDER — MIDAZOLAM HCL 2 MG/2ML IJ SOLN
1.0000 mg | INTRAMUSCULAR | Status: DC | PRN
  Administered 2011-10-29: 2 mg via INTRAVENOUS

## 2011-10-29 MED ORDER — FERROUS SULFATE 325 (65 FE) MG PO TABS
325.0000 mg | ORAL_TABLET | Freq: Three times a day (TID) | ORAL | Status: DC
  Administered 2011-10-30 – 2011-11-03 (×13): 325 mg via ORAL
  Filled 2011-10-29 (×19): qty 1

## 2011-10-29 MED ORDER — METOCLOPRAMIDE HCL 5 MG/ML IJ SOLN
5.0000 mg | Freq: Three times a day (TID) | INTRAMUSCULAR | Status: DC | PRN
  Administered 2011-10-29 – 2011-10-30 (×2): 10 mg via INTRAVENOUS
  Filled 2011-10-29 (×2): qty 2

## 2011-10-29 MED ORDER — LEVALBUTEROL TARTRATE 45 MCG/ACT IN AERO
1.0000 | INHALATION_SPRAY | RESPIRATORY_TRACT | Status: DC | PRN
  Filled 2011-10-29: qty 15

## 2011-10-29 MED ORDER — PROMETHAZINE HCL 25 MG/ML IJ SOLN
12.5000 mg | Freq: Four times a day (QID) | INTRAMUSCULAR | Status: DC | PRN
  Administered 2011-10-29: 12.5 mg via INTRAVENOUS
  Filled 2011-10-29: qty 1

## 2011-10-29 MED ORDER — CEFAZOLIN SODIUM 1-5 GM-% IV SOLN: INTRAVENOUS | Status: DC | PRN

## 2011-10-29 MED ORDER — CEFAZOLIN SODIUM-DEXTROSE 2-3 GM-% IV SOLR
2.0000 g | INTRAVENOUS | Status: AC
  Administered 2011-10-29: 2 g via INTRAVENOUS

## 2011-10-29 MED ORDER — ALUM & MAG HYDROXIDE-SIMETH 200-200-20 MG/5ML PO SUSP
30.0000 mL | ORAL | Status: DC | PRN
  Administered 2011-10-30: 30 mL via ORAL
  Filled 2011-10-29: qty 30

## 2011-10-29 MED ORDER — FLEET ENEMA 7-19 GM/118ML RE ENEM: 1.0000 | ENEMA | Freq: Once | RECTAL | Status: AC | PRN

## 2011-10-29 MED ORDER — POTASSIUM CHLORIDE CRYS ER 10 MEQ PO TBCR
10.0000 meq | EXTENDED_RELEASE_TABLET | Freq: Every day | ORAL | Status: DC
  Filled 2011-10-29 (×2): qty 1

## 2011-10-29 MED ORDER — LACTATED RINGERS IV SOLN
INTRAVENOUS | Status: DC
  Administered 2011-10-29: 14:00:00 via INTRAVENOUS
  Administered 2011-10-29: 1000 mL via INTRAVENOUS

## 2011-10-29 MED ORDER — AMLODIPINE BESYLATE 5 MG PO TABS
5.0000 mg | ORAL_TABLET | Freq: Every day | ORAL | Status: DC
  Administered 2011-10-30 – 2011-11-03 (×5): 5 mg via ORAL
  Filled 2011-10-29 (×7): qty 1

## 2011-10-29 MED ORDER — PROPOFOL 10 MG/ML IV EMUL
INTRAVENOUS | Status: DC | PRN
  Administered 2011-10-29: 70 ug/kg/min via INTRAVENOUS

## 2011-10-29 MED ORDER — POTASSIUM CHLORIDE IN NACL 20-0.9 MEQ/L-% IV SOLN
INTRAVENOUS | Status: AC
  Administered 2011-10-29: 1000 mL via INTRAVENOUS
  Filled 2011-10-29: qty 1000

## 2011-10-29 MED ORDER — PROMETHAZINE HCL 25 MG/ML IJ SOLN: 6.2500 mg | INTRAMUSCULAR | Status: DC | PRN

## 2011-10-29 MED ORDER — FENTANYL CITRATE 0.05 MG/ML IJ SOLN
INTRAMUSCULAR | Status: DC | PRN
  Administered 2011-10-29 (×2): 50 ug via INTRAVENOUS

## 2011-10-29 MED ORDER — BUPIVACAINE IN DEXTROSE 0.75-8.25 % IT SOLN
INTRATHECAL | Status: DC | PRN
  Administered 2011-10-29: 1.8 mL via INTRATHECAL

## 2011-10-29 MED ORDER — LACTATED RINGERS IV SOLN: INTRAVENOUS | Status: DC

## 2011-10-29 MED ORDER — DIPHENHYDRAMINE HCL 12.5 MG/5ML PO ELIX
12.5000 mg | ORAL_SOLUTION | ORAL | Status: DC | PRN
  Administered 2011-11-01 (×2): 12.5 mg via ORAL
  Filled 2011-10-29 (×3): qty 5

## 2011-10-29 MED ORDER — DOCUSATE SODIUM 100 MG PO CAPS
100.0000 mg | ORAL_CAPSULE | Freq: Two times a day (BID) | ORAL | Status: DC
  Administered 2011-10-29 – 2011-11-03 (×10): 100 mg via ORAL

## 2011-10-29 MED ORDER — CEFAZOLIN SODIUM-DEXTROSE 2-3 GM-% IV SOLR
INTRAVENOUS | Status: AC
  Filled 2011-10-29: qty 50

## 2011-10-29 MED ORDER — MIDAZOLAM HCL 2 MG/ML PO SYRP: 0.5000 mg/kg | ORAL_SOLUTION | Freq: Once | ORAL | Status: DC

## 2011-10-29 MED ORDER — POTASSIUM CHLORIDE IN NACL 20-0.9 MEQ/L-% IV SOLN
INTRAVENOUS | Status: DC
  Administered 2011-10-29: 17:00:00 via INTRAVENOUS
  Administered 2011-10-29: 1000 mL via INTRAVENOUS
  Administered 2011-10-30 – 2011-10-31 (×3): via INTRAVENOUS
  Filled 2011-10-29 (×4): qty 1000

## 2011-10-29 MED ORDER — METHOCARBAMOL 100 MG/ML IJ SOLN
500.0000 mg | Freq: Four times a day (QID) | INTRAVENOUS | Status: DC | PRN
  Administered 2011-10-29: 500 mg via INTRAVENOUS
  Filled 2011-10-29: qty 5

## 2011-10-29 MED ORDER — PANTOPRAZOLE SODIUM 40 MG PO TBEC
40.0000 mg | DELAYED_RELEASE_TABLET | Freq: Every day | ORAL | Status: DC
  Administered 2011-10-30 – 2011-11-03 (×5): 40 mg via ORAL
  Filled 2011-10-29 (×7): qty 1

## 2011-10-29 MED ORDER — SODIUM CHLORIDE 0.9 % IR SOLN
Status: DC | PRN
  Administered 2011-10-29: 1000 mL

## 2011-10-29 MED ORDER — PHENOL 1.4 % MT LIQD
1.0000 | OROMUCOSAL | Status: DC | PRN
  Filled 2011-10-29: qty 177

## 2011-10-29 MED ORDER — ENOXAPARIN SODIUM 30 MG/0.3ML ~~LOC~~ SOLN
30.0000 mg | Freq: Two times a day (BID) | SUBCUTANEOUS | Status: DC
  Administered 2011-10-30 – 2011-11-03 (×9): 30 mg via SUBCUTANEOUS
  Filled 2011-10-29 (×12): qty 0.3

## 2011-10-29 MED ORDER — OXYCODONE HCL 5 MG PO TABS
5.0000 mg | ORAL_TABLET | ORAL | Status: DC | PRN
  Administered 2011-10-29 – 2011-10-30 (×3): 10 mg via ORAL
  Filled 2011-10-29 (×4): qty 2

## 2011-10-29 MED ORDER — ACETAMINOPHEN 650 MG RE SUPP: 650.0000 mg | Freq: Four times a day (QID) | RECTAL | Status: DC | PRN

## 2011-10-29 MED ORDER — CEFAZOLIN SODIUM-DEXTROSE 2-3 GM-% IV SOLR
2.0000 g | Freq: Four times a day (QID) | INTRAVENOUS | Status: AC
  Administered 2011-10-29 – 2011-10-30 (×3): 2 g via INTRAVENOUS
  Filled 2011-10-29 (×3): qty 50

## 2011-10-29 MED ORDER — MENTHOL 3 MG MT LOZG
1.0000 | LOZENGE | OROMUCOSAL | Status: DC | PRN
  Filled 2011-10-29: qty 9

## 2011-10-29 MED ORDER — KETAMINE HCL 10 MG/ML IJ SOLN
INTRAMUSCULAR | Status: DC | PRN
  Administered 2011-10-29: 10 mg via INTRAVENOUS
  Administered 2011-10-29: 5 mg via INTRAVENOUS
  Administered 2011-10-29: 10 mg via INTRAVENOUS
  Administered 2011-10-29: 5 mg via INTRAVENOUS
  Administered 2011-10-29 (×3): 10 mg via INTRAVENOUS

## 2011-10-29 MED ORDER — BISACODYL 10 MG RE SUPP: 10.0000 mg | Freq: Every day | RECTAL | Status: DC | PRN

## 2011-10-29 MED ORDER — FENTANYL CITRATE 0.05 MG/ML IJ SOLN
25.0000 ug | INTRAMUSCULAR | Status: DC | PRN
  Administered 2011-10-29 (×2): 50 ug via INTRAVENOUS

## 2011-10-29 MED ORDER — METOCLOPRAMIDE HCL 10 MG PO TABS: 5.0000 mg | ORAL_TABLET | Freq: Three times a day (TID) | ORAL | Status: DC | PRN

## 2011-10-29 MED ORDER — POTASSIUM CHLORIDE CRYS ER 10 MEQ PO TBCR
10.0000 meq | EXTENDED_RELEASE_TABLET | Freq: Every morning | ORAL | Status: DC
  Administered 2011-10-30 – 2011-11-03 (×5): 10 meq via ORAL
  Filled 2011-10-29 (×5): qty 1

## 2011-10-29 MED ORDER — POVIDONE-IODINE 7.5 % EX SOLN: Freq: Once | CUTANEOUS | Status: DC

## 2011-10-29 MED ORDER — HYDROMORPHONE HCL PF 1 MG/ML IJ SOLN
0.5000 mg | INTRAMUSCULAR | Status: DC | PRN
  Administered 2011-10-29 – 2011-10-30 (×6): 1 mg via INTRAVENOUS
  Filled 2011-10-29 (×6): qty 1

## 2011-10-29 MED ORDER — SODIUM CHLORIDE 0.9 % IV SOLN: INTRAVENOUS | Status: DC

## 2011-10-29 MED ORDER — ONDANSETRON HCL 4 MG/2ML IJ SOLN
4.0000 mg | Freq: Four times a day (QID) | INTRAMUSCULAR | Status: DC | PRN
  Filled 2011-10-29: qty 2

## 2011-10-29 MED ORDER — FENTANYL CITRATE 0.05 MG/ML IJ SOLN
INTRAMUSCULAR | Status: AC
  Filled 2011-10-29: qty 2

## 2011-10-29 MED ORDER — SALMETEROL XINAFOATE 50 MCG/DOSE IN AEPB
1.0000 | INHALATION_SPRAY | Freq: Two times a day (BID) | RESPIRATORY_TRACT | Status: DC | PRN
  Filled 2011-10-29: qty 0

## 2011-10-29 MED ORDER — LORATADINE 10 MG PO TABS
10.0000 mg | ORAL_TABLET | Freq: Every day | ORAL | Status: DC
  Administered 2011-10-30 – 2011-11-03 (×5): 10 mg via ORAL
  Filled 2011-10-29 (×6): qty 1

## 2011-10-29 MED ORDER — ALBUTEROL SULFATE (5 MG/ML) 0.5% IN NEBU: 2.5000 mg | INHALATION_SOLUTION | Freq: Four times a day (QID) | RESPIRATORY_TRACT | Status: DC | PRN

## 2011-10-29 MED ORDER — MIDAZOLAM HCL 5 MG/5ML IJ SOLN
INTRAMUSCULAR | Status: DC | PRN
  Administered 2011-10-29: 2 mg via INTRAVENOUS

## 2011-10-29 MED ORDER — METHOCARBAMOL 500 MG PO TABS
500.0000 mg | ORAL_TABLET | Freq: Four times a day (QID) | ORAL | Status: DC | PRN
  Administered 2011-10-30 – 2011-11-03 (×10): 500 mg via ORAL
  Filled 2011-10-29 (×10): qty 1

## 2011-10-29 MED ORDER — ZOLPIDEM TARTRATE 5 MG PO TABS: 5.0000 mg | ORAL_TABLET | Freq: Every evening | ORAL | Status: DC | PRN

## 2011-10-29 MED ORDER — ACETAMINOPHEN 325 MG PO TABS
650.0000 mg | ORAL_TABLET | Freq: Four times a day (QID) | ORAL | Status: DC | PRN
  Administered 2011-10-30: 650 mg via ORAL
  Filled 2011-10-29: qty 2

## 2011-10-29 MED ORDER — ONDANSETRON HCL 4 MG PO TABS: 4.0000 mg | ORAL_TABLET | Freq: Four times a day (QID) | ORAL | Status: DC | PRN

## 2011-10-29 MED ORDER — MIDAZOLAM HCL 2 MG/2ML IJ SOLN
INTRAMUSCULAR | Status: AC
  Filled 2011-10-29: qty 2

## 2011-10-29 MED ORDER — ACETAMINOPHEN 10 MG/ML IV SOLN
INTRAVENOUS | Status: AC
  Filled 2011-10-29: qty 100

## 2011-10-29 SURGICAL SUPPLY — 63 items
BAG SPEC THK2 15X12 ZIP CLS (MISCELLANEOUS) ×1
BAG ZIPLOCK 12X15 (MISCELLANEOUS) ×3 IMPLANT
BANDAGE ELASTIC 4 VELCRO ST LF (GAUZE/BANDAGES/DRESSINGS) ×2 IMPLANT
BANDAGE ELASTIC 6 VELCRO ST LF (GAUZE/BANDAGES/DRESSINGS) ×2 IMPLANT
BANDAGE ESMARK 6X9 LF (GAUZE/BANDAGES/DRESSINGS) ×1 IMPLANT
BANDAGE GAUZE ELAST BULKY 4 IN (GAUZE/BANDAGES/DRESSINGS) ×3 IMPLANT
BLADE SAG 18X100X1.27 (BLADE) ×2 IMPLANT
BLADE SAW SGTL 13.0X1.19X90.0M (BLADE) ×2 IMPLANT
BNDG CMPR 9X6 STRL LF SNTH (GAUZE/BANDAGES/DRESSINGS) ×1
BNDG ESMARK 6X9 LF (GAUZE/BANDAGES/DRESSINGS) ×2
CEMENT HV SMART SET (Cement) ×4 IMPLANT
CLOTH BEACON ORANGE TIMEOUT ST (SAFETY) ×2 IMPLANT
CLSR STERI-STRIP ANTIMIC 1/2X4 (GAUZE/BANDAGES/DRESSINGS) ×1 IMPLANT
CUFF TOURN SGL QUICK 34 (TOURNIQUET CUFF) ×2
CUFF TRNQT CYL 34X4X40X1 (TOURNIQUET CUFF) ×1 IMPLANT
DRAPE EXTREMITY T 121X128X90 (DRAPE) ×2 IMPLANT
DRAPE LG THREE QUARTER DISP (DRAPES) ×2 IMPLANT
DRAPE POUCH INSTRU U-SHP 10X18 (DRAPES) ×2 IMPLANT
DRAPE U-SHAPE 47X51 STRL (DRAPES) ×2 IMPLANT
DRSG PAD ABDOMINAL 8X10 ST (GAUZE/BANDAGES/DRESSINGS) ×3 IMPLANT
DURAPREP 26ML APPLICATOR (WOUND CARE) ×2 IMPLANT
ELECT REM PT RETURN 9FT ADLT (ELECTROSURGICAL) ×2
ELECTRODE REM PT RTRN 9FT ADLT (ELECTROSURGICAL) ×1 IMPLANT
EVACUATOR 1/8 PVC DRAIN (DRAIN) ×2 IMPLANT
FACESHIELD LNG OPTICON STERILE (SAFETY) ×10 IMPLANT
GAUZE XEROFORM 2X2 STRL (GAUZE/BANDAGES/DRESSINGS) ×2 IMPLANT
GLOVE ECLIPSE 8.0 STRL XLNG CF (GLOVE) ×2 IMPLANT
GLOVE SURG ORTHO 8.0 STRL STRW (GLOVE) ×2 IMPLANT
GLOVE SURG ORTHO 9.0 STRL STRW (GLOVE) ×2 IMPLANT
GOWN PREVENTION PLUS XLARGE (GOWN DISPOSABLE) ×6 IMPLANT
GOWN STRL NON-REIN LRG LVL3 (GOWN DISPOSABLE) ×2 IMPLANT
GOWN STRL REIN XL XLG (GOWN DISPOSABLE) ×2 IMPLANT
HANDPIECE INTERPULSE COAX TIP (DISPOSABLE) ×2
IMMOBILIZER KNEE 20 (SOFTGOODS) ×2
IMMOBILIZER KNEE 20 THIGH 36 (SOFTGOODS) IMPLANT
KIT BASIN OR (CUSTOM PROCEDURE TRAY) ×2 IMPLANT
NS IRRIG 1000ML POUR BTL (IV SOLUTION) ×2 IMPLANT
PACK TOTAL JOINT (CUSTOM PROCEDURE TRAY) ×2 IMPLANT
POSITIONER SURGICAL ARM (MISCELLANEOUS) ×2 IMPLANT
SET HNDPC FAN SPRY TIP SCT (DISPOSABLE) ×1 IMPLANT
SET PAD KNEE POSITIONER (MISCELLANEOUS) ×2 IMPLANT
SPONGE GAUZE 4X4 12PLY (GAUZE/BANDAGES/DRESSINGS) ×2 IMPLANT
SPONGE LAP 18X18 X RAY DECT (DISPOSABLE) ×1 IMPLANT
SPONGE SURGIFOAM ABS GEL 100 (HEMOSTASIS) ×2 IMPLANT
STOCKINETTE 6  STRL (DRAPES) ×1
STOCKINETTE 6 STRL (DRAPES) ×1 IMPLANT
STRIP CLOSURE SKIN 1/2X4 (GAUZE/BANDAGES/DRESSINGS) ×4 IMPLANT
SUCTION FRAZIER 12FR DISP (SUCTIONS) ×2 IMPLANT
SUT BONE WAX W31G (SUTURE) ×2 IMPLANT
SUT MNCRL AB 3-0 PS2 18 (SUTURE) ×2 IMPLANT
SUT VIC AB 0 CT1 27 (SUTURE)
SUT VIC AB 0 CT1 27XBRD ANTBC (SUTURE) ×2 IMPLANT
SUT VIC AB 1 CT1 27 (SUTURE) ×8
SUT VIC AB 1 CT1 27XBRD ANTBC (SUTURE) ×7 IMPLANT
SUT VIC AB 2-0 CT1 27 (SUTURE) ×4
SUT VIC AB 2-0 CT1 TAPERPNT 27 (SUTURE) ×2 IMPLANT
SUT VLOC 180 0 24IN GS25 (SUTURE) ×1 IMPLANT
TAPE STRIPS DRAPE STRL (GAUZE/BANDAGES/DRESSINGS) ×2 IMPLANT
TOWEL OR 17X26 10 PK STRL BLUE (TOWEL DISPOSABLE) ×5 IMPLANT
TOWER CARTRIDGE SMART MIX (DISPOSABLE) ×2 IMPLANT
TRAY FOLEY CATH 14FRSI W/METER (CATHETERS) ×2 IMPLANT
WATER STERILE IRR 1500ML POUR (IV SOLUTION) ×2 IMPLANT
WRAP KNEE MAXI GEL POST OP (GAUZE/BANDAGES/DRESSINGS) ×3 IMPLANT

## 2011-10-29 NOTE — Anesthesia Procedure Notes (Signed)
Spinal  Patient location during procedure: OR Start time: 10/29/2011 1:10 PM End time: 10/29/2011 1:17 PM Staffing Anesthesiologist: Lucille Passy F Performed by: anesthesiologist  Preanesthetic Checklist Completed: patient identified, site marked, surgical consent, pre-op evaluation, timeout performed, IV checked, risks and benefits discussed and monitors and equipment checked Spinal Block Patient position: sitting Prep: Betadine Patient monitoring: heart rate, continuous pulse ox, blood pressure and cardiac monitor Approach: midline Location: L3-4 Injection technique: single-shot Needle Needle type: Quincke  Needle gauge: 22 G Needle length: 9 cm Assessment Sensory level: T6 Additional Notes Expiration date of kit checked and confirmed. Patient tolerated procedure well, without complications. Negative heme/paresthesia Lot [id number] 02/2013

## 2011-10-29 NOTE — Anesthesia Postprocedure Evaluation (Signed)
  Anesthesia Post-op Note  Patient: Wendy Reid  Procedure(s) Performed: Procedure(s) (LRB): TOTAL KNEE ARTHROPLASTY (Right)  Patient Location: PACU  Anesthesia Type: Spinal  Level of Consciousness: awake and alert   Airway and Oxygen Therapy: Patient Spontanous Breathing  Post-op Pain: mild  Post-op Assessment: Post-op Vital signs reviewed, Patient's Cardiovascular Status Stable, Respiratory Function Stable, Patent Airway and No signs of Nausea or vomiting  Post-op Vital Signs: stable  Complications: No apparent anesthesia complications

## 2011-10-29 NOTE — Progress Notes (Signed)
Pt states she had chest tightness yesterday and saw her cardiologist Dr. Luberta Robertson . Normal EKG

## 2011-10-29 NOTE — Transfer of Care (Signed)
Immediate Anesthesia Transfer of Care Note  Patient: Wendy Reid  Procedure(s) Performed: Procedure(s) (LRB): TOTAL KNEE ARTHROPLASTY (Right)  Patient Location: PACU  Anesthesia Type: Regional  Level of Consciousness: awake and alert   Airway & Oxygen Therapy: Patient Spontanous Breathing and Patient connected to face mask oxygen  Post-op Assessment: Report given to PACU RN and Post -op Vital signs reviewed and stable  Post vital signs: Reviewed and stable  Complications: No apparent anesthesia complications

## 2011-10-29 NOTE — Anesthesia Preprocedure Evaluation (Addendum)
Anesthesia Evaluation  Patient identified by MRN, date of birth, ID band Patient awake    Reviewed: Allergy & Precautions, H&P , NPO status , Patient's Chart, lab work & pertinent test results  Airway Mallampati: II TM Distance: >3 FB Neck ROM: Full    Dental  (+) Teeth Intact and Dental Advisory Given   Pulmonary asthma , sleep apnea (STOPBANG 4) ,  breath sounds clear to auscultation  Pulmonary exam normal       Cardiovascular hypertension, Pt. on medications Rhythm:Regular Rate:Normal     Neuro/Psych negative neurological ROS  negative psych ROS   GI/Hepatic Neg liver ROS, GERD-  Medicated,  Endo/Other  negative endocrine ROSMorbid obesity  Renal/GU negative Renal ROS  negative genitourinary   Musculoskeletal negative musculoskeletal ROS (+)   Abdominal   Peds negative pediatric ROS (+)  Hematology negative hematology ROS (+)   Anesthesia Other Findings   Reproductive/Obstetrics negative OB ROS                          Anesthesia Physical Anesthesia Plan  ASA: II  Anesthesia Plan: Spinal   Post-op Pain Management:    Induction:   Airway Management Planned: Simple Face Mask  Additional Equipment:   Intra-op Plan:   Post-operative Plan:   Informed Consent: I have reviewed the patients History and Physical, chart, labs and discussed the procedure including the risks, benefits and alternatives for the proposed anesthesia with the patient or authorized representative who has indicated his/her understanding and acceptance.   Dental advisory given  Plan Discussed with: CRNA  Anesthesia Plan Comments:         Anesthesia Quick Evaluation

## 2011-10-29 NOTE — Op Note (Signed)
DATE OF SURGERY:  10/29/2011  TIME: 2:58 PM  PATIENT NAME:  Wendy Reid    AGE: 61 y.o.   PRE-OPERATIVE DIAGNOSIS:  Osteoarthritis of the Right Knee  POST-OPERATIVE DIAGNOSIS:  Osteoarthritis of the Right Knee  PROCEDURE:  Procedure(s): TOTAL KNEE ARTHROPLASTY  SURGEON:  Solveig Fangman ANDREW  ASSISTANT:  Oneida Alar, PA-C, present and scrubbed throughout the case, critical for assistance with exposure, retraction, instrumentation, and closure.  OPERATIVE IMPLANTS: Depuy PFC Sigma Rotating Platform.  Femur size 3, Tibia size 3, Patella size 38 3-peg oval button, with a 15 mm polyethylene insert.   PREOPERATIVE INDICATIONS:   GRETHEL ZENK is a 61 y.o. year old female with end stage bone on bone arthritis of the knee who failed conservative treatment and elected for Total Knee Arthroplasty.   The risks, benefits, and alternatives were discussed at length including but not limited to the risks of infection, bleeding, nerve injury, stiffness, blood clots, the need for revision surgery, cardiopulmonary complications, among others, and they were willing to proceed.  OPERATIVE DESCRIPTION:  The patient was brought to the operative room and placed in a supine position.  Spinal anesthesia was administered.  IV antibiotics were given.  The lower extremity was prepped and draped in the usual sterile fashion.  Time out was performed.  The leg was elevated and exsanguinated and the tourniquet was inflated.  Anterior quadriceps tendon splitting approach was performed.  The patella was retracted and osteophytes were removed.  The anterior horn of the medial and lateral meniscus was removed and cruciate ligaments resected.   The distal femur was opened with the drill and the intramedullary distal femoral cutting jig was utilized, set at 5 degrees resecting 10 mm off the distal femur.  Care was taken to protect the collateral ligaments.  The distal femoral sizing jig was applied,  taking care to avoid notching.  Then the 4-in-1 cutting jig was applied and the anterior and posterior femur was cut, along with the chamfer cuts.    Then the extramedullary tibial cutting jig was utilized making the appropriate cut using the anterior tibial crest as a reference building in appropriate posterior slope.  Care was taken during the cut to protect the medial and collateral ligaments.  The proximal tibia was removed along with the posterior horns of the menisci.   The posterior medial femoral osteophytes and posterior lateral femoral osteophytes were removed.    The flexion gap was then measured and was symmetric with the extension gap, measured at 15.  I completed the distal femoral preparation using the appropriate jig to prepare the box.  The patella was then measured, and cut with the saw.    The proximal tibia sized and prepared accordingly with the reamer and the punch, and then all components were trialed with the trial insert.  The knee was found to have excellent balance and full motion.    The above named components were then cemented into place and all excess cement was removed.  The trial polyethylene component was in place during cementation, and then was exchanged for the real polyethylene component.    The knee was easily taken through a range of motion and the patella tracked well and the knee irrigated copiously and the parapatellar and subcutaneous tissue closed with vicryl, and monocryl with steri strips for the skin.  The arthrotomy was closed at 90 of flexion. The wounds were dressed with sterile gauze and the tourniquet released and the patient was awakened and returned  to the PACU in stable and satisfactory condition.  There were no complications.  Total tourniquet time was 85 minutes.

## 2011-10-29 NOTE — H&P (Signed)
NAMEMarland Kitchen  Wendy Reid, Wendy Reid          ACCOUNT NO.:  [id number]  MEDICAL RECORD NO.:  [id number]  LOCATION:                               FACILITY:  [facility]  PHYSICIAN:  Erasmo Leventhal, M.D.DATE OF BIRTH:  October 04, 1950  DATE OF ADMISSION:  10/29/2011 DATE OF DISCHARGE:                             HISTORY & PHYSICAL   ADMISSION DIAGNOSES: 1. End-stage osteoarthritis, bilateral knees; right more symptomatic     than left. 2. Obesity. 3. Hypertension. 4. Gastroesophageal reflux disease. 5. Asthma with spring allergies.  HISTORY OF PRESENT ILLNESS:  The patient is a pleasant 61 year old female with a long-term existent and advanced osteoarthritis, bilateral knees.  The patient was evaluated and treated by Dr. Thomasena Edis with arthroscope, injections and conservative other treatments.  The patient progressed to have worsening of her symptoms in bilateral knees, right currently more symptomatic than left.  The patient has requested to have a total knee arthroplasty.  It is quite appropriate for her findings.  X- rays revealed she has end-stage osteoarthritis, bone-on-bone, medial lateral patellofemoral joints bilateral knees, but right is more symptomatic, so we will proceed with that.  The patient has been evaluated and cleared by her primary care physician and the cardiologist, and she is currently going through an extensive workup with the pulmonologist due to her history of asthma, which seems to be worsened with her spring allergies, she has never been hospitalized.  PRIMARY CARE PHYSICIAN:  Dr. Reola Calkins.  CARDIOLOGIST:  Dr. Retta Mac in [facility].  PULMONOLOGIST:  Dr. Bethanie Dicker in [facility].  ALLERGIES:  [facility] Flirt and [facility] causing extreme nausea.  CURRENT MEDICATIONS: 1. Norvasc 5 mg once a day. 2. Potassium 10 mEq a day. 3. Protonix 40 mg a day. 4. Zyrtec 10 mg a day. 5. Albuterol. 6. Xopenex. 7. Serevent inhalers as needed.  PAST MEDICAL HISTORY:  Review  of medical history includes; 1. Asthma worsened with spring allergies. 2. Reflux disease. 3. Hypertension. 4. Barrett esophagitis. 5. Occasional urinary incontinence.  REVIEW OF SYSTEMS:  NEUROLOGIC:  She denies any strokes, seizures, or convulsions.  No alcohol or drug issues.  RESPIRATORY:  She denies any recent shortness of breath, but she does feel like she is a little tight and congested, she does have issues when she is outside and about with her spring allergies, currently well controlled with her inhalers.  She denies any history of tuberculosis COPD or sleep apnea.  CARDIOVASCULAR: She does have hypertension.  She is on the same medicines for a while. She has had a Lexiscan done in November 2012, which was unremarkable. She denies any recent issues with chest pains, irregular heart rhythms, beating too slow or fast, shortness of breath or PND.  GASTROINTESTINAL: She does have GERD, it is well controlled with her Protonix.  She does have issues related in the past with Barrett esophagitis, no signs of malignancy, followed by Dr. Lesle Chris I believe at [facility].  She has had her gallbladder removed in the past.  No issues related to jaundice, hepatitis, irritable bowel, or diverticular issues.  GENITOURINARY:  She does have issues related to occasional incontinence, urinary.  She denies any frequent UTI, stones, or difficulty urinating.  ENDOCRINE: She denies any thyroid  or diabetic issues.  HEMATOLOGIC:  She does have a history of anemia in the past and she was on iron.  PAST SURGICAL HISTORY:  Includes; 1. Tonsillectomy. 2. Appendectomy. 3. Hysterectomy. 4. Her knees were scoped several times. 5. She had a cholecystectomy without any problems with anesthesia.  FAMILY MEDICAL HISTORY:  Her father is deceased from respiratory issues, post surgical.  Her mother is deceased from cardiac issues.  SOCIAL HISTORY:  She is widowed.  She has never smoked.  No alcohol or drugs.   She does not have anyone at home to help her.  She would like to go into rehab and she is working on a facility at this time.  PHYSICAL EXAMINATION:  VITAL SIGNS:  Height is 5 feet and 1 inch. Weight is 215 pounds. GENERAL:  The patient is a healthy appearing, heavy-set female, appears to be uncomfortable with ambulating.  She walks with a side-to-side gait with a significant limp. NECK:  Supple.  No palpable lymphadenopathy. LUNGS:  Clear today throughout.  Currently she says she has good breathing today.  No wheezing or rhonchi noted. HEART:  Regular rate and rhythm. ABDOMEN:  Soft.  Bowel sounds present. EXTREMITIES:  She had good range of motion of her upper extremities with excellent motor strength.  Lower extremities; she has good pulses in lower extremities.  She had painful range of motion of both knees.  She lacks about 5 degrees extension and flexes back about 110 degrees.  She had no signs of phlebitis, edema, or venostasis issues in her lower extremities. BREAST:  Deferred. RECTAL:  Deferred. GENITOURINARY:  Deferred.  IMPRESSION: 1. End-stage osteoarthritis, bilateral knees; right more symptomatic     than left. 2. Hypertension. 3. History of asthma with seasonal spring allergies. 4. History of Barrett esophagitis. 5. History of gastroesophageal reflux disease. 6. Obesity.  PLAN:  The patient has been evaluated and cleared by her primary care physician, Dr. Reola Calkins; also cleared by her cardiologist.  She does have a recent pulmonary function test through the Anesthesia department prior to her surgery.  She will undergo all routine labs and tests today prior to the surgical procedure.     Jamelle Rushing, P.A.   ______________________________ Erasmo Leventhal, M.D.    RWK/MEDQ  D:  10/20/2011  T:  10/23/2011  Job:  [id number]

## 2011-10-30 LAB — BASIC METABOLIC PANEL
CO2: 24 mEq/L (ref 19–32)
Chloride: 98 mEq/L (ref 96–112)
Sodium: 134 mEq/L — ABNORMAL LOW (ref 135–145)

## 2011-10-30 LAB — CBC
Platelets: 291 10*3/uL (ref 150–400)
RBC: 3.31 MIL/uL — ABNORMAL LOW (ref 3.87–5.11)
WBC: 10.3 10*3/uL (ref 4.0–10.5)

## 2011-10-30 MED ORDER — HYDROMORPHONE HCL PF 1 MG/ML IJ SOLN
1.0000 mg | INTRAMUSCULAR | Status: DC | PRN
  Administered 2011-10-30 (×2): 1 mg via INTRAVENOUS
  Filled 2011-10-30 (×2): qty 1

## 2011-10-30 MED ORDER — OXYCODONE HCL 5 MG PO TABS
5.0000 mg | ORAL_TABLET | ORAL | Status: DC | PRN
  Administered 2011-10-30: 10 mg via ORAL
  Administered 2011-10-30: 15 mg via ORAL
  Administered 2011-10-30: 5 mg via ORAL
  Administered 2011-10-30 – 2011-11-03 (×19): 15 mg via ORAL
  Filled 2011-10-30: qty 1
  Filled 2011-10-30 (×17): qty 3
  Filled 2011-10-30: qty 2
  Filled 2011-10-30 (×4): qty 3

## 2011-10-30 NOTE — Progress Notes (Signed)
Subjective: 1 Day Post-Op Procedure(s) (LRB): TOTAL KNEE ARTHROPLASTY (Right) Patient reports pain as severe.  Asks for mor dilaudid.  + N/V.  Objective: Vital signs in last 24 hours: Temp:  [97.3 F (36.3 C)-98.3 F (36.8 C)] 97.9 F (36.6 C) (05/25 0642) Pulse Rate:  [65-86] 80  (05/25 0642) Resp:  [13-20] 18  (05/25 0642) BP: (110-148)/(55-87) 140/84 mmHg (05/25 0642) SpO2:  [94 %-100 %] 100 % (05/25 0642) Weight:  [98.476 kg (217 lb 1.6 oz)] 98.476 kg (217 lb 1.6 oz) (05/24 1645)  Intake/Output from previous day: 05/24 0701 - 05/25 0700 In: 3878.8 [P.O.:960; I.V.:2818.8; IV Piggyback:100] Out: 1191 [YNWGN:5621; Emesis/NG output:1; Drains:65; Blood:200] Intake/Output this shift:     Basename 10/30/11 0438  HGB 9.5*    Basename 10/30/11 0438  WBC 10.3  RBC 3.31*  HCT 28.4*  PLT 291    Basename 10/30/11 0438  NA 134*  K 3.9  CL 98  CO2 24  BUN 9  CREATININE 0.54  GLUCOSE 157*  CALCIUM 9.1   No results found for this basename: LABPT:2,INR:2 in the last 72 hours  Neurovascular intact Compartment soft  Wound dressed and dry.  HV d/c'd.  Assessment/Plan: 1 Day Post-Op Procedure(s) (LRB): TOTAL KNEE ARTHROPLASTY (Right) Discharge to SNF Monday.  Increase oxy. And decrease freq dilaudid.  Wendy Reid 10/30/2011, 9:25 AM

## 2011-10-30 NOTE — Evaluation (Signed)
Physical Therapy Evaluation Patient Details Name: Wendy Reid MRN: [id number] DOB: [birth year] Today's Date: 10/30/2011 Time: 1478-2956 PT Time Calculation (min): 29 min  PT Assessment / Plan / Recommendation Clinical Impression  Pt presents s/p R TKA POD 1 with decreased ROM, strength in RLE and decreased mobility.  Tolerated ambulation into hallway with RW.  Pt will benefit from skilled PT in acute venue to address deficits.  Pt states that she does not have anyone at home to assist her upon D/C, therefore will need SNF at D/C to increase pt safety and return pt to prior level of functioning.     PT Assessment  Patient needs continued PT services    Follow Up Recommendations  Skilled nursing facility    Barriers to Discharge Decreased caregiver support      lEquipment Recommendations  Defer to next venue    Recommendations for Other Services OT consult   Frequency 7X/week    Precautions / Restrictions Precautions Precautions: Knee Required Braces or Orthoses: Knee Immobilizer - Right Knee Immobilizer - Right: On except when in CPM;On when out of bed or walking Restrictions Weight Bearing Restrictions: No Other Position/Activity Restrictions: WBAT   Pertinent Vitals/Pain 8-9/10      Mobility  Bed Mobility Bed Mobility: Supine to Sit Supine to Sit: 4: Min assist;HOB elevated Details for Bed Mobility Assistance: Min assist for RLE out of bed with cues for hand placement to self assist trunk.  Transfers Transfers: Sit to Stand;Stand to Sit Sit to Stand: 3: Mod assist;From elevated surface;With upper extremity assist;From bed Stand to Sit: 4: Min assist;With upper extremity assist;With armrests;To chair/3-in-1 Details for Transfer Assistance: Assist to rise from bed surface with cues for hand placement and LE management when sitting/standing. Cues for controlled descent when sitting.  Ambulation/Gait Ambulation/Gait Assistance: 4: Min assist;3: Mod  assist Ambulation Distance (Feet): 30 Feet Assistive device: Rolling walker Ambulation/Gait Assistance Details: Cues for upright posture, sequencing and technique with RW, and for slowly increasing WB through RLE.  Gait Pattern: Step-to pattern;Trunk flexed;Decreased stride length Gait velocity: decreased Stairs: No Wheelchair Mobility Wheelchair Mobility: No    Exercises     PT Diagnosis: Difficulty walking;Abnormality of gait;Generalized weakness;Acute pain  PT Problem List: Decreased strength;Decreased range of motion;Decreased activity tolerance;Decreased balance;Decreased mobility;Decreased knowledge of use of DME;Pain;Decreased knowledge of precautions PT Treatment Interventions: DME instruction;Gait training;Functional mobility training;Therapeutic activities;Therapeutic exercise;Balance training;Patient/family education   PT Goals Acute Rehab PT Goals PT Goal Formulation: With patient Time For Goal Achievement: 10/30/11 Potential to Achieve Goals: Good Pt will go Supine/Side to Sit: with supervision PT Goal: Supine/Side to Sit - Progress: Goal set today Pt will go Sit to Supine/Side: with supervision PT Goal: Sit to Supine/Side - Progress: Goal set today Pt will go Sit to Stand: with supervision PT Goal: Sit to Stand - Progress: Goal set today Pt will go Stand to Sit: with supervision PT Goal: Stand to Sit - Progress: Goal set today Pt will Ambulate: 51 - 150 feet;with supervision;with least restrictive assistive device PT Goal: Ambulate - Progress: Goal set today  Visit Information  Last PT Received On: 10/30/11 Assistance Needed: +1 (1.5 for chair follow)    Subjective Data  Subjective: I'm ready to get up Patient Stated Goal: to get up   Prior Functioning  Home Living Lives With: Son Type of Home: House Home Access: Stairs to enter [employer] of Steps: 1 Entrance Stairs-Rails: None Home Layout: Two level;Able to live on main level with  bedroom/bathroom Bathroom Shower/Tub:  Walk-in shower Bathroom Toilet: Standard Home Adaptive Equipment: None Prior Function Level of Independence: Independent Able to Take Stairs?: Yes Driving: Yes Vocation: On disability Communication Communication: No difficulties    Cognition  Overall Cognitive Status: Appears within functional limits for tasks assessed/performed Arousal/Alertness: Awake/alert Orientation Level: Appears intact for tasks assessed Behavior During Session: [facility] for tasks performed    Extremity/Trunk Assessment Right Lower Extremity Assessment RLE ROM/Strength/Tone: Deficits RLE ROM/Strength/Tone Deficits: ankle motions WFL, unable to perform SLR against gravity, can initiate with assist from therapist.  RLE Sensation: WFL - Light Touch RLE Coordination: WFL - gross motor Left Lower Extremity Assessment LLE ROM/Strength/Tone: WFL for tasks assessed LLE Sensation: WFL - Light Touch LLE Coordination: WFL - gross motor Trunk Assessment Trunk Assessment: Normal   Balance    End of Session PT - End of Session Equipment Utilized During Treatment: Gait belt;Right knee immobilizer Activity Tolerance: Patient limited by fatigue Patient left: in chair;with call bell/phone within reach;with family/visitor present Nurse Communication: Mobility status   Page, Meribeth Mattes 10/30/2011, 11:26 AM

## 2011-10-30 NOTE — Progress Notes (Signed)
Physical Therapy Treatment Patient Details Name: Wendy Reid MRN: [id number] DOB: 11/05/1950 Today's Date: 10/30/2011 Time: 4782-9562 PT Time Calculation (min): 30 min  PT Assessment / Plan / Recommendation Comments on Treatment Session  Pt progressing slowly with ambulation.  Continue to cue for sequencing/technique.  Did well with exercises.     Follow Up Recommendations  Skilled nursing facility    Barriers to Discharge Decreased caregiver support      Equipment Recommendations  Defer to next venue    Recommendations for Other Services OT consult  Frequency 7X/week   Plan Discharge plan remains appropriate    Precautions / Restrictions Precautions Precautions: Knee Required Braces or Orthoses: Knee Immobilizer - Right Knee Immobilizer - Right: On except when in CPM;On when out of bed or walking Restrictions Weight Bearing Restrictions: No Other Position/Activity Restrictions: WBAT   Pertinent Vitals/Pain Pts pain level ok, noted fatigue from pain meds    Mobility  Bed Mobility Bed Mobility: Sit to Supine Supine to Sit: 4: Min assist;HOB elevated Sit to Supine: 4: Min assist;HOB flat Details for Bed Mobility Assistance: Assist for RLE into bed with cues for technique.  Transfers Transfers: Sit to Stand;Stand to Sit Sit to Stand: 4: Min assist;3: Mod assist;With upper extremity assist;With armrests;From chair/3-in-1 Stand to Sit: 4: Min assist;With upper extremity assist;To elevated surface;To bed;To chair/3-in-1 Details for Transfer Assistance: Pt requires less assist when standing from recliner with cues for B UE on arm rests and LE management.  Cues for hand placement when sitting as well.   Ambulation/Gait Ambulation/Gait Assistance: 4: Min assist Ambulation Distance (Feet): 30 Feet Assistive device: Rolling walker Ambulation/Gait Assistance Details: Cues for upright posture, sequencing and technique with RW.   Gait Pattern: Step-to pattern;Trunk  flexed;Decreased stride length Gait velocity: decreased Stairs: No Wheelchair Mobility Wheelchair Mobility: No    Exercises Total Joint Exercises Ankle Circles/Pumps: AROM;20 reps;Both Quad Sets: AROM;Right;10 reps Short Arc QuadBarbaraann Boys;Right;10 reps Heel Slides: AAROM;Right;10 reps Hip ABduction/ADduction: AAROM;Right;10 reps Straight Leg Raises: AAROM;Right;10 reps   PT Diagnosis: Difficulty walking;Abnormality of gait;Generalized weakness;Acute pain  PT Problem List: Decreased strength;Decreased range of motion;Decreased activity tolerance;Decreased balance;Decreased mobility;Decreased knowledge of use of DME;Pain;Decreased knowledge of precautions PT Treatment Interventions: DME instruction;Gait training;Functional mobility training;Therapeutic activities;Therapeutic exercise;Balance training;Patient/family education   PT Goals Acute Rehab PT Goals PT Goal Formulation: With patient Time For Goal Achievement: 10/30/11 Potential to Achieve Goals: Good Pt will go Supine/Side to Sit: with supervision PT Goal: Supine/Side to Sit - Progress: Goal set today Pt will go Sit to Supine/Side: with supervision PT Goal: Sit to Supine/Side - Progress: Progressing toward goal Pt will go Sit to Stand: with supervision PT Goal: Sit to Stand - Progress: Progressing toward goal Pt will go Stand to Sit: with supervision PT Goal: Stand to Sit - Progress: Progressing toward goal Pt will Ambulate: 51 - 150 feet;with supervision;with least restrictive assistive device PT Goal: Ambulate - Progress: Progressing toward goal  Visit Information  Last PT Received On: 10/30/11 Assistance Needed: +1    Subjective Data  Subjective: I want to try and walk to the same spot as I did this morning.  Patient Stated Goal: to get up   Cognition  Overall Cognitive Status: Appears within functional limits for tasks assessed/performed Arousal/Alertness: Awake/alert Orientation Level: Appears intact for tasks  assessed Behavior During Session: [facility] for tasks performed    Balance     End of Session PT - End of Session Equipment Utilized During Treatment: Gait belt;Right knee immobilizer Activity  Tolerance: Patient limited by fatigue Patient left: in bed;with call bell/phone within reach Nurse Communication: Mobility status CPM Right Knee CPM Right Knee: On    Lessie Dings 10/30/2011, 2:54 PM

## 2011-10-31 LAB — CBC
HCT: 25.4 % — ABNORMAL LOW (ref 36.0–46.0)
MCV: 85.8 fL (ref 78.0–100.0)
Platelets: 228 10*3/uL (ref 150–400)
RBC: 2.96 MIL/uL — ABNORMAL LOW (ref 3.87–5.11)
WBC: 8.5 10*3/uL (ref 4.0–10.5)

## 2011-10-31 MED ORDER — HYDROMORPHONE HCL PF 1 MG/ML IJ SOLN: 1.0000 mg | INTRAMUSCULAR | Status: DC | PRN

## 2011-10-31 NOTE — Progress Notes (Signed)
10/31/11 1405  PT Visit Information  Last PT Received On 10/31/11  Assistance Needed +1  PT Time Calculation  PT Start Time 1320  PT Stop Time 1335  PT Time Calculation (min) 15 min  Subjective Data  Subjective I need to use the restroom  Patient Stated Goal to get up  Precautions  Precautions Knee  Required Braces or Orthoses Knee Immobilizer - Right  Knee Immobilizer - Right On except when in CPM;On when out of bed or walking  Restrictions  Weight Bearing Restrictions No  Other Position/Activity Restrictions WBAT  Cognition  Overall Cognitive Status Appears within functional limits for tasks assessed/performed  Arousal/Alertness Awake/alert  Orientation Level Appears intact for tasks assessed  Behavior During Session [facility] for tasks performed  Bed Mobility  Bed Mobility Not assessed  Transfers  Transfers Sit to Stand;Stand to Sit  Sit to Stand 4: Min assist;3: Mod assist;With upper extremity assist;With armrests;From chair/3-in-1  Stand to Sit 4: Min assist;With armrests;To chair/3-in-1;With upper extremity assist  Details for Transfer Assistance Cues for hand placement and LE management.   Ambulation/Gait  Ambulation/Gait Assistance 1: +2 Total assist  Ambulation/Gait: Patient Percentage 60%  Ambulation Distance (Feet) 15 Feet (then another 15)  Assistive device Rolling walker  Ambulation/Gait Assistance Details Continue to provide manual and verbal cuing for RW placement due to pt tendency to push RW too far ahead of her.  Also cues for posture and to not let go of RW.   Gait Pattern Step-to pattern;Trunk flexed;Decreased stride length  Gait velocity decreased  PT - End of Session  Equipment Utilized During Treatment Gait belt;Right knee immobilizer  Activity Tolerance Patient tolerated treatment well  Patient left in chair;with call bell/phone within reach  PT - Assessment/Plan  Comments on Treatment Session Pt continues to progress slowly with ambulation and mobiltiy.    PT Plan Discharge plan remains appropriate  PT Frequency 7X/week  Follow Up Recommendations Skilled nursing facility  Equipment Recommended Defer to next venue  Acute Rehab PT Goals  PT Goal Formulation With patient  Time For Goal Achievement 10/30/11  Potential to Achieve Goals Good  Pt will go Sit to Stand with supervision  PT Goal: Sit to Stand - Progress Progressing toward goal  Pt will go Stand to Sit with supervision  PT Goal: Stand to Sit - Progress Progressing toward goal  Pt will Ambulate 51 - 150 feet;with supervision;with least restrictive assistive device  PT Goal: Ambulate - Progress Progressing toward goal

## 2011-10-31 NOTE — Progress Notes (Signed)
Physical Therapy Treatment Patient Details Name: Wendy Reid MRN: [id number] DOB: 10/05/1950 Today's Date: 10/31/2011 Time: 7829-5621 PT Time Calculation (min): 20 min  PT Assessment / Plan / Recommendation Comments on Treatment Session  Pt very motivated to continue working to get stronger for eventual return home.     Follow Up Recommendations  Skilled nursing facility    Barriers to Discharge        Equipment Recommendations  Defer to next venue    Recommendations for Other Services    Frequency 7X/week   Plan Discharge plan remains appropriate    Precautions / Restrictions Precautions Precautions: Knee Required Braces or Orthoses: Knee Immobilizer - Right Knee Immobilizer - Right: On except when in CPM;On when out of bed or walking Restrictions Weight Bearing Restrictions: No Other Position/Activity Restrictions: WBAT   Pertinent Vitals/Pain Pain ok during pm session    Mobility  Bed Mobility Bed Mobility: Sit to Supine Supine to Sit: 4: Min assist;HOB elevated Details for Bed Mobility Assistance: Assist for RLE into bed with cues for technique and positioning hips.  Transfers Transfers: Sit to Stand;Stand to Sit Sit to Stand: 4: Min assist;With upper extremity assist;With armrests;From chair/3-in-1 Stand to Sit: 4: Min guard;With upper extremity assist;To elevated surface;To bed Details for Transfer Assistance: Cues for hand placement and LE management.  Ambulation/Gait Ambulation/Gait Assistance: 4: Min assist Ambulation/Gait: Patient Percentage: 60% Ambulation Distance (Feet): 5 Feet Assistive device: Rolling walker Ambulation/Gait Assistance Details: Cues for upright posture and sequencing, esp when backing up  Gait Pattern: Step-to pattern;Trunk flexed;Decreased stride length Gait velocity: decreased    Exercises Total Joint Exercises Ankle Circles/Pumps: AROM;20 reps;Both Quad Sets: AROM;Right;10 reps Short Arc QuadBarbaraann Boys;Right;10 reps Heel  Slides: AAROM;Right;10 reps Hip ABduction/ADduction: AAROM;Right;10 reps Straight Leg Raises: AAROM;Right;10 reps   PT Diagnosis:    PT Problem List:   PT Treatment Interventions:     PT Goals Acute Rehab PT Goals PT Goal Formulation: With patient Time For Goal Achievement: 10/30/11 Potential to Achieve Goals: Good Pt will go Sit to Supine/Side: with supervision PT Goal: Sit to Supine/Side - Progress: Progressing toward goal Pt will go Sit to Stand: with supervision PT Goal: Sit to Stand - Progress: Progressing toward goal Pt will go Stand to Sit: with supervision PT Goal: Stand to Sit - Progress: Progressing toward goal Pt will Ambulate: 51 - 150 feet;with supervision;with least restrictive assistive device PT Goal: Ambulate - Progress: Progressing toward goal  Visit Information  Last PT Received On: 10/31/11 Assistance Needed: +1    Subjective Data  Subjective: I want to get back to bed when exercises are done so I can do the machine Patient Stated Goal: to get up   Cognition  Overall Cognitive Status: Appears within functional limits for tasks assessed/performed Arousal/Alertness: Awake/alert Orientation Level: Appears intact for tasks assessed Behavior During Session: [facility] for tasks performed    Balance     End of Session PT - End of Session Equipment Utilized During Treatment: Gait belt;Right knee immobilizer Activity Tolerance: Patient tolerated treatment well Patient left: in bed;with call bell/phone within reach    Lessie Dings 10/31/2011, 3:07 PM

## 2011-10-31 NOTE — Progress Notes (Signed)
CSW was contacted by MD to discuss NSF Placement and possible d/c.   CSW faxed OT notes for insurance authorization.   CSW weekday to f/u  Leron Croak, Judie Grieve Weekend Coverage [phone]

## 2011-10-31 NOTE — Progress Notes (Signed)
Physical Therapy Treatment Patient Details Name: Wendy Reid MRN: [id number] DOB: 08-16-1950 Today's Date: 10/31/2011 Time: 4098-1191 PT Time Calculation (min): 20 min  PT Assessment / Plan / Recommendation Comments on Treatment Session  Pt continues to progress slowly with ambulation with increased c/o fatigue this morning.      Follow Up Recommendations  Skilled nursing facility    Barriers to Discharge        Equipment Recommendations  Defer to next venue    Recommendations for Other Services    Frequency 7X/week   Plan Discharge plan remains appropriate    Precautions / Restrictions Precautions Precautions: Knee Required Braces or Orthoses: Knee Immobilizer - Right Knee Immobilizer - Right: On except when in CPM;On when out of bed or walking Restrictions Weight Bearing Restrictions: No Other Position/Activity Restrictions: WBAT   Pertinent Vitals/Pain 4/10    Mobility  Bed Mobility Bed Mobility: Supine to Sit Supine to Sit: 4: Min assist;HOB elevated Details for Bed Mobility Assistance: Min assist for RLE out of bed with cues for hand placement to self assist trunk.  Transfers Transfers: Sit to Stand;Stand to Sit Sit to Stand: From elevated surface;With upper extremity assist;From bed;4: Min assist;3: Mod assist Stand to Sit: 4: Min assist;With upper extremity assist;With armrests;To chair/3-in-1 Details for Transfer Assistance: Assist to rise from bed surface with cues for hand placement and LE management when sitting/standing. Cues for controlled descent when sitting.  Ambulation/Gait Ambulation/Gait Assistance: 1: +2 Total assist Ambulation/Gait: Patient Percentage: 60% Ambulation Distance (Feet): 30 Feet Assistive device: Rolling walker Ambulation/Gait Assistance Details: Continue to provide cues for upright posture, sequencing/technique and safety due to pt tendency to let go of RW or bend over RW when resting.  Gait Pattern: Step-to pattern;Trunk  flexed;Decreased stride length Gait velocity: decreased    Exercises     PT Diagnosis:    PT Problem List:   PT Treatment Interventions:     PT Goals Acute Rehab PT Goals PT Goal Formulation: With patient Time For Goal Achievement: 10/30/11 Potential to Achieve Goals: Good Pt will go Supine/Side to Sit: with supervision PT Goal: Supine/Side to Sit - Progress: Progressing toward goal Pt will go Sit to Stand: with supervision PT Goal: Sit to Stand - Progress: Progressing toward goal Pt will go Stand to Sit: with supervision PT Goal: Stand to Sit - Progress: Progressing toward goal Pt will Ambulate: 51 - 150 feet;with supervision;with least restrictive assistive device PT Goal: Ambulate - Progress: Progressing toward goal  Visit Information  Last PT Received On: 10/31/11 Assistance Needed:  (1.5)    Subjective Data  Subjective: I'm feeling sleepy from these pain meds Patient Stated Goal: to get up   Cognition  Overall Cognitive Status: Appears within functional limits for tasks assessed/performed Arousal/Alertness: Awake/alert Orientation Level: Appears intact for tasks assessed Behavior During Session: [facility] for tasks performed    Balance     End of Session PT - End of Session Equipment Utilized During Treatment: Gait belt;Right knee immobilizer Activity Tolerance: Patient limited by fatigue Patient left: in chair;with call bell/phone within reach Nurse Communication: Mobility status    Page, Meribeth Mattes 10/31/2011, 9:37 AM

## 2011-10-31 NOTE — Evaluation (Signed)
Occupational Therapy Evaluation Patient Details Name: Wendy Reid MRN: [id number] DOB: 23-Jan-1951 Today's Date: 10/31/2011 Time: 4098-1191 OT Time Calculation (min): 38 min  OT Assessment / Plan / Recommendation Clinical Impression  This 61 y.o. female admitted for Rt. THA.  Pt. demonstrates the below listed deficits and will benefit from OT to maximize safety and indpendence with BADLs to allow her to return thom modifiied independent level after SNF level rehab    OT Assessment  Patient needs continued OT Services    Follow Up Recommendations  Supervision/Assistance - 24 hour;Skilled nursing facility    Barriers to Discharge Decreased caregiver support    Equipment Recommendations  Defer to next venue    Recommendations for Other Services    Frequency  Min 1X/week    Precautions / Restrictions Precautions Precautions: Knee Required Braces or Orthoses: Knee Immobilizer - Right Knee Immobilizer - Right: On except when in CPM;On when out of bed or walking Restrictions Weight Bearing Restrictions: No Other Position/Activity Restrictions: WBAT       ADL  Eating/Feeding: Simulated;Independent Where Assessed - Eating/Feeding: Chair Grooming: Simulated;Wash/dry hands;Wash/dry face;Teeth care;Set up Where Assessed - Grooming: Supported sitting Upper Body Bathing: Simulated;Minimal assistance Where Assessed - Upper Body Bathing: Supported sitting Lower Body Bathing: Simulated;Moderate assistance Where Assessed - Lower Body Bathing: Supported sit to stand Upper Body Dressing: Simulated;Set up Where Assessed - Upper Body Dressing: Unsupported sitting Lower Body Dressing: Simulated;Performed;Moderate assistance (socks) Where Assessed - Lower Body Dressing: Sopported sit to stand Toilet Transfer: Performed;Minimal assistance Toilet Transfer Method: Stand pivot Toilet Transfer Equipment: Bedside commode Toileting - Clothing Manipulation and Hygiene: Performed;Minimal  assistance Where Assessed - Engineer, mining and Hygiene: Standing Equipment Used: Rolling walker Transfers/Ambulation Related to ADLs: min A, and cues for walker placement ADL Comments: Pt. required mod A to don Rt. sock.  Able to doff with supervision    OT Diagnosis: Generalized weakness;Acute pain  OT Problem List: Decreased strength;Decreased activity tolerance;Impaired balance (sitting and/or standing);Decreased knowledge of use of DME or AE;Obesity;Pain OT Treatment Interventions: Self-care/ADL training;DME and/or AE instruction;Therapeutic activities;Patient/family education;Balance training   OT Goals Acute Rehab OT Goals OT Goal Formulation: With patient Time For Goal Achievement: 11/07/11 Potential to Achieve Goals: Good ADL Goals Pt Will Perform Grooming: with supervision;Standing at sink ADL Goal: Grooming - Progress: Goal set today Pt Will Perform Lower Body Bathing: Sit to stand from chair;Sit to stand from bed;with supervision ADL Goal: Lower Body Bathing - Progress: Goal set today Pt Will Perform Lower Body Dressing: with supervision;Sit to stand from chair;Sit to stand from bed ADL Goal: Lower Body Dressing - Progress: Goal set today Pt Will Transfer to Toilet: with supervision;Ambulation;3-in-1 ADL Goal: Toilet Transfer - Progress: Goal set today Pt Will Perform Toileting - Clothing Manipulation: with supervision;Standing ADL Goal: Toileting - Clothing Manipulation - Progress: Goal set today  Visit Information  Last OT Received On: 10/31/11 Assistance Needed: +1    Subjective Data  Subjective: "How long do you think before I can do that?"  re: donning/doffing socks Patient Stated Goal: To regain independence and return to work   Prior Functioning  Home Living Lives With: Son Available Help at Discharge: Skilled Nursing Facility Type of Home: House Home Access: Stairs to enter Secretary/administrator of Steps: 1 Entrance Stairs-Rails:  None Home Layout: Two level;Able to live on main level with bedroom/bathroom Bathroom Shower/Tub: Health visitor: Standard Home Adaptive Equipment: None Prior Function Level of Independence: Independent Able to Take Stairs?: Yes Driving: Yes Vocation:  (  worked as Charity fundraiser) Musician: No difficulties Dominant Hand: Right    Cognition  Overall Cognitive Status: Appears within functional limits for tasks assessed/performed Arousal/Alertness: Awake/alert Orientation Level: Appears intact for tasks assessed Behavior During Session: [facility] for tasks performed    Extremity/Trunk Assessment Right Upper Extremity Assessment RUE ROM/Strength/Tone: Within functional levels RUE Sensation: WFL - Light Touch RUE Coordination: WFL - gross/fine motor Left Upper Extremity Assessment LUE ROM/Strength/Tone: Within functional levels LUE Sensation: WFL - Light Touch LUE Coordination: WFL - gross/fine motor Trunk Assessment Trunk Assessment: Normal   Mobility Bed Mobility Bed Mobility: Supine to Sit Supine to Sit: 4: Min assist;HOB elevated Details for Bed Mobility Assistance: Min assist for RLE out of bed with cues for hand placement to self assist trunk.  Transfers Transfers: Sit to Stand;Stand to Sit Sit to Stand: 4: Min guard;From elevated surface;With upper extremity assist;From chair/3-in-1 Stand to Sit: 4: Min guard;With upper extremity assist;To chair/3-in-1 Details for Transfer Assistance: cues for walker placement   Exercise    Balance    End of Session OT - End of Session Activity Tolerance: Patient tolerated treatment well Patient left: in chair;with call bell/phone within reach CPM Right Knee CPM Right Knee: Off   Kendrah Lovern M 10/31/2011, 11:55 AM

## 2011-10-31 NOTE — Progress Notes (Signed)
Subjective: 2 Days Post-Op Procedure(s) (LRB): TOTAL KNEE ARTHROPLASTY (Right) Patient reports pain as mild.    Objective: Vital signs in last 24 hours: Temp:  [97.7 F (36.5 C)-98.4 F (36.9 C)] 97.9 F (36.6 C) (05/26 0619) Pulse Rate:  [73-107] 105  (05/26 0619) Resp:  [16-18] 16  (05/26 0619) BP: (133-146)/(81-90) 133/81 mmHg (05/26 0619) SpO2:  [94 %-98 %] 96 % (05/26 0619)  Intake/Output from previous day: 05/25 0701 - 05/26 0700 In: 3112.5 [P.O.:1200; I.V.:1912.5] Out: 1550 [Urine:1550] Intake/Output this shift:     Basename 10/31/11 0825 10/30/11 0438  HGB 8.3* 9.5*    Basename 10/31/11 0825 10/30/11 0438  WBC 8.5 10.3  RBC 2.96* 3.31*  HCT 25.4* 28.4*  PLT 228 291    Basename 10/30/11 0438  NA 134*  K 3.9  CL 98  CO2 24  BUN 9  CREATININE 0.54  GLUCOSE 157*  CALCIUM 9.1   No results found for this basename: LABPT:2,INR:2 in the last 72 hours  Incision: dressing C/D/I  Assessment/Plan: 2 Days Post-Op Procedure(s) (LRB): TOTAL KNEE ARTHROPLASTY (Right) Up with therapy D/C IV SNF Monday  Wendy Reid A 10/31/2011, 9:24 AM

## 2011-11-01 ENCOUNTER — Inpatient Hospital Stay (HOSPITAL_COMMUNITY): Payer: PRIVATE HEALTH INSURANCE

## 2011-11-01 ENCOUNTER — Encounter (HOSPITAL_COMMUNITY): Payer: Self-pay | Admitting: Internal Medicine

## 2011-11-01 DIAGNOSIS — I10 Essential (primary) hypertension: Secondary | ICD-10-CM | POA: Diagnosis present

## 2011-11-01 DIAGNOSIS — M179 Osteoarthritis of knee, unspecified: Secondary | ICD-10-CM | POA: Diagnosis present

## 2011-11-01 DIAGNOSIS — M1710 Unilateral primary osteoarthritis, unspecified knee: Secondary | ICD-10-CM

## 2011-11-01 DIAGNOSIS — K209 Esophagitis, unspecified without bleeding: Secondary | ICD-10-CM

## 2011-11-01 DIAGNOSIS — D589 Hereditary hemolytic anemia, unspecified: Secondary | ICD-10-CM | POA: Insufficient documentation

## 2011-11-01 DIAGNOSIS — R000 Tachycardia, unspecified: Secondary | ICD-10-CM

## 2011-11-01 DIAGNOSIS — M159 Polyosteoarthritis, unspecified: Secondary | ICD-10-CM

## 2011-11-01 DIAGNOSIS — D62 Acute posthemorrhagic anemia: Secondary | ICD-10-CM | POA: Diagnosis not present

## 2011-11-01 DIAGNOSIS — R112 Nausea with vomiting, unspecified: Secondary | ICD-10-CM

## 2011-11-01 DIAGNOSIS — Z9049 Acquired absence of other specified parts of digestive tract: Secondary | ICD-10-CM | POA: Insufficient documentation

## 2011-11-01 DIAGNOSIS — N393 Stress incontinence (female) (male): Secondary | ICD-10-CM

## 2011-11-01 DIAGNOSIS — K2270 Barrett's esophagus without dysplasia: Secondary | ICD-10-CM | POA: Insufficient documentation

## 2011-11-01 DIAGNOSIS — J45909 Unspecified asthma, uncomplicated: Secondary | ICD-10-CM | POA: Diagnosis present

## 2011-11-01 DIAGNOSIS — K219 Gastro-esophageal reflux disease without esophagitis: Secondary | ICD-10-CM | POA: Diagnosis present

## 2011-11-01 HISTORY — DX: Esophagitis, unspecified without bleeding: K20.90

## 2011-11-01 HISTORY — DX: Stress incontinence (female) (male): N39.3

## 2011-11-01 HISTORY — DX: Hereditary hemolytic anemia, unspecified: D58.9

## 2011-11-01 HISTORY — DX: Osteoarthritis of knee, unspecified: M17.9

## 2011-11-01 HISTORY — DX: Unilateral primary osteoarthritis, unspecified knee: M17.10

## 2011-11-01 LAB — IRON AND TIBC
Saturation Ratios: 8 % — ABNORMAL LOW (ref 20–55)
UIBC: 228 ug/dL (ref 125–400)

## 2011-11-01 LAB — CBC
HCT: 24.8 % — ABNORMAL LOW (ref 36.0–46.0)
Hemoglobin: 8.2 g/dL — ABNORMAL LOW (ref 12.0–15.0)
MCH: 28.5 pg (ref 26.0–34.0)
MCHC: 33.1 g/dL (ref 30.0–36.0)

## 2011-11-01 LAB — COMPREHENSIVE METABOLIC PANEL
ALT: 19 U/L (ref 0–35)
Albumin: 3 g/dL — ABNORMAL LOW (ref 3.5–5.2)
Alkaline Phosphatase: 85 U/L (ref 39–117)
Calcium: 9.1 mg/dL (ref 8.4–10.5)
GFR calc Af Amer: >90 mL/min (ref >90)
Glucose, Bld: 175 mg/dL — ABNORMAL HIGH (ref 70–99)
Potassium: 3.7 mEq/L (ref 3.5–5.1)
Sodium: 134 mEq/L — ABNORMAL LOW (ref 135–145)
Total Protein: 7.5 g/dL (ref 6.0–8.3)

## 2011-11-01 LAB — URINALYSIS, ROUTINE W REFLEX MICROSCOPIC
Hgb urine dipstick: NEGATIVE
Nitrite: NEGATIVE
Specific Gravity, Urine: 1.015 (ref 1.005–1.030)
Urobilinogen, UA: 0.2 mg/dL (ref 0.0–1.0)
pH: 6 (ref 5.0–8.0)

## 2011-11-01 LAB — CARDIAC PANEL(CRET KIN+CKTOT+MB+TROPI): Relative Index: 1.3 (ref 0.0–2.5)

## 2011-11-01 MED ORDER — FUROSEMIDE 10 MG/ML IJ SOLN
20.0000 mg | Freq: Once | INTRAMUSCULAR | Status: AC
  Administered 2011-11-01: 20 mg via INTRAVENOUS
  Filled 2011-11-01: qty 2

## 2011-11-01 MED ORDER — DIPHENHYDRAMINE HCL 25 MG PO CAPS
25.0000 mg | ORAL_CAPSULE | Freq: Once | ORAL | Status: AC
  Administered 2011-11-01: 25 mg via ORAL
  Filled 2011-11-01: qty 1

## 2011-11-01 MED ORDER — ACETAMINOPHEN 325 MG PO TABS
650.0000 mg | ORAL_TABLET | Freq: Once | ORAL | Status: AC
  Administered 2011-11-01: 650 mg via ORAL
  Filled 2011-11-01: qty 2

## 2011-11-01 NOTE — Progress Notes (Signed)
CSW continues to follow for d/c planning to [employer].  [employer] insurance is not open today.  Will continue to follow for d/c planning.  Fleet Contras (coverage for [place])  [phone]

## 2011-11-01 NOTE — Consult Note (Signed)
Wendy Reid MRN: [id number] DOB/AGE: [birth year] 60 y.o. Primary Care Physician:Beck, Carlene Coria, MD, MD Admit date: 10/29/2011 Consult Date: 11/01/11  Reason for consult: Tachycardia  Attending Requesting Consult: Dr Eugenia Mcalpine HPI:  Wendy Reid is a pleasant 61 year old African American female with a history of hypertension, anemia, gastroesophageal reflux disease,  long-term existent/advanced bilateral osteoarthritis of the knees that has been treated conservatively with arthroscopes, injections and conservative treatments however patient had progressed to worsening of her symptoms in the bilateral knees and was admitted on 10/29/2011 for total right knee arthroplasty.patient had a total knee arthroplasty done on 10/29/2011 and seem to be doing well postop however 2-3 days postop patient developed a tachycardia with heart rates going up as high as 129. We will called to consult for further evaluation and management. On interview with the patient she stated that a week prior to admission had some chest discomfort she had an EKG done at her PCP's office which was normal. Patient also had a normal lexiscan done in November of 2012 which was normal. Patient subsequently underwent surgery and was doing okay. Patient denied any shortness of breath no chest pain patient did endorse some wheezing denied any fever no chills no nausea no vomiting no abdominal pain no diarrhea no constipation no dysuria no weakness no palpitations no other associated symptoms. Patient however does endorse some chest discomfort today. Patient denies any radiation. Patient denies any pressure associated with it. No other associated symptoms.  Past Medical History  Diagnosis Date  . Barrett's syndrome     hx of  . Asthma   . Anemia     hx hemolytic anemia  . GERD (gastroesophageal reflux disease)   . Sleep apnea     STOPBANG=4  . Hypertension   . HTN (hypertension) 11/01/2011  . Osteoarthritis of knee  11/01/2011     Endstage OA  Bilateral R > L  . Asthma in adult 11/01/2011  . Barrett's esophagus with esophagitis 11/01/2011    Patient states no evidence of barrett's from last surveillance EGD on 11/2010  . Stress bladder incontinence, female 11/01/2011  . 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 Hx of cholecystectomy 11/01/2011    04/2010    Past Surgical History  Procedure Date  . Cholecystectomy nov 2011  . Appendectomy as teenager  . Tonsillectomy as teenager  . Abdominal hysterectomy 1999    complete  . Both knees arthroscopic sugery last done 2007     Prior to Admission medications   Medication Sig Start Date End Date Taking? Authorizing Provider  albuterol (PROVENTIL) (2.5 MG/3ML) 0.083% nebulizer solution Take 2.5 mg by nebulization every evening. For wheezing   Yes Historical Provider, MD  amLODipine (NORVASC) 5 MG tablet Take 5 mg by mouth daily with breakfast.    Yes Historical Provider, MD  fish oil-omega-3 fatty acids 1000 MG capsule Take 2 g by mouth daily.    Yes Historical Provider, MD  ibuprofen (ADVIL,MOTRIN) 800 MG tablet Take 800 mg by mouth every 6 (six) hours as needed. For pain   Yes Historical Provider, MD  pantoprazole (PROTONIX) 40 MG tablet Take 40 mg by mouth daily with breakfast.    Yes Historical Provider, MD  potassium chloride (K-DUR,KLOR-CON) 10 MEQ tablet Take 10 mEq by mouth every morning.   Yes Historical Provider, MD  potassium chloride SA (K-DUR,KLOR-CON) 20 MEQ tablet Take 10 mEq by mouth daily with breakfast.    Yes Historical Provider, MD  salmeterol (SEREVENT) 50 MCG/DOSE diskus inhaler Inhale 1  puff into the lungs 2 (two) times daily as needed. For shortness of breath   Yes Historical Provider, MD  cetirizine (ZYRTEC) 10 MG tablet Take 10 mg by mouth as needed.     Historical Provider, MD  levalbuterol [facility] HFA) 45 MCG/ACT inhaler Inhale 1-2 puffs into the lungs every 4 (four) hours as needed. For shortness of breath    Historical Provider, MD     Allergies:  Allergies  Allergen Reactions  . Vicodin (Hydrocodone-Acetaminophen) Nausea And Vomiting  . Zofran Nausea And Vomiting    History reviewed. No pertinent family history.  Social History:  reports that she has never smoked. She has never used smokeless tobacco. She reports that she does not drink alcohol or use illicit drugs.  ROS: All systems reviewed with the patient and was positive as per HPI otherwise all other systems are negative.  PHYSICAL EXAM: Blood pressure 129/83, pulse 129, temperature 98.4 F (36.9 C), temperature source Oral, resp. rate 16, height 5' 1.5" (1.562 m), weight 98.476 kg (217 lb 1.6 oz), SpO2 97.00%. General: pleasant well-developed well-nourished female in no acute cardiopulmonary distress.Alert, awake, oriented x3, in no acute distress. HEENT: normocephalic atraumatic. Pupils equal round and reactive to light and accommodation. Extraocular movements intact. Oropharynx is clear, no lesions, no exudates. Neck is supple with no lymphadenopathy.No bruits, no goiter. Heart: tachycardia, without murmurs, rubs, gallops. Lungs: Clear to auscultation bilaterally. Abdomen: Soft, nontender, nondistended, positive bowel sounds. Extremities: No clubbing cyanosis.right lower extremity with 1-2+ edema with positive pedal pulses.bandage over right knee. Neuro: alert and oriented x3. Cranial nerves II through XII are grossly intact. No focal deficits. Sensation is intact. Grossly intact, nonfocal.    EKG: sinus tachycardia  No results found for this or any previous visit (from the past 240 hour(s)).   Lab results:  [facility] 10/30/11 0438  NA 134*  K 3.9  CL 98  CO2 24  GLUCOSE 157*  BUN 9  CREATININE 0.54  CALCIUM 9.1  MG --  PHOS --   No results found for this basename: AST:2,ALT:2,ALKPHOS:2,BILITOT:2,PROT:2,ALBUMIN:2 in the last 72 hours No results found for this basename: LIPASE:2,AMYLASE:2 in the last 72 hours  Basename 11/01/11 0429  10/31/11 0825  WBC 8.4 8.5  NEUTROABS -- --  HGB 8.2* 8.3*  HCT 24.8* 25.4*  MCV 86.1 85.8  PLT 252 228   No results found for this basename: CKTOTAL:3,CKMB:3,CKMBINDEX:3,TROPONINI:3 in the last 72 hours No components found with this basename: POCBNP:3 No results found for this basename: DDIMER in the last 72 hours No results found for this basename: HGBA1C:2 in the last 72 hours No results found for this basename: CHOL:2,HDL:2,LDLCALC:2,TRIG:2,CHOLHDL:2,LDLDIRECT:2 in the last 72 hours No results found for this basename: TSH,T4TOTAL,FREET3,T3FREE,THYROIDAB in the last 72 hours No results found for this basename: VITAMINB12:2,FOLATE:2,FERRITIN:2,TIBC:2,IRON:2,RETICCTPCT:2 in the last 72 hours Imaging results:  No results found. Impression/Plan:  Principal Problem:  *Osteoarthritis of knee Active Problems:  Tachycardia  Anemia  Acute blood loss anemia  HTN (hypertension)  GERD (gastroesophageal reflux disease)  Asthma in adult   #1 tachycardia Likely secondary to symptomatic anemia. On 10/20/2011 patient's hemoglobin was 10.4. Today on 11/01/2011 patient's hemoglobin is 8.2. Hemoglobin is up likely secondary to perioperative blood loss. Patient with no overt GI bleed. EKG shows a sinus tachycardia. Patient does endorse some chest discomfort however her description is very atypical. Patient does not have a fever. Will check a comprehensive metabolic profile, check a TSH, check an anemia panel, we'll cycle cardiac enzymes every 8 hours  x3,check a chest x-ray, check a UA with cultures and sensitivities, check orthostatics. Will transfuse patient 2 units of packed red blood cells and follow.  #2 acute blood loss anemia/anemia Likely secondary to perioperative blood loss. Patient's hemoglobin was 10.4 10/20/2011 and is currently 8.2 on 11/01/2011. Will check an acute anemia panel. Will transfuse 2 units of packed red blood cells. Continue iron supplements.Will follow  H&H.  #3gastroesophageal reflux disease Stable.continue Protonix.  #4 hypertension  Stable continue home regimen of Norvasc.  #5 asthma Stable. Continue Claritin, Xopenex, Serevent when necessary albuterol nebs as needed.  #6 end-stage bilateral OTO osteoarthritis right greater than left status post right total knee arthroplasty Per primary team.  Been a pleasure consulted on Ms. Fredirick Maudlin. Will follow along with you.   Adaysha Dubinsky [phone] 11/01/2011, 4:27 PM

## 2011-11-01 NOTE — Progress Notes (Signed)
Physical Therapy Treatment Patient Details Name: Wendy Reid MRN: [id number] DOB: 1950/09/29 Today's Date: 11/01/2011 Time: 4098-1191 PT Time Calculation (min): 35 min  PT Assessment / Plan / Recommendation Comments on Treatment Session  Pt progressing slowly.  Only agreed to ambulate to/from bathroom and perform exercises in bed.     Follow Up Recommendations  Skilled nursing facility    Barriers to Discharge        Equipment Recommendations  Defer to next venue    Recommendations for Other Services OT consult  Frequency 7X/week   Plan Discharge plan remains appropriate    Precautions / Restrictions Precautions Precautions: Knee Required Braces or Orthoses: Knee Immobilizer - Right Knee Immobilizer - Right: On except when in CPM;On when out of bed or walking Restrictions Weight Bearing Restrictions: No Other Position/Activity Restrictions: WBAT   Pertinent Vitals/Pain HR 122 following exercises.  RN aware.     Mobility  Bed Mobility Bed Mobility: Sit to Supine Supine to Sit: 4: Min assist;HOB elevated Sit to Supine: 4: Min assist;HOB flat Details for Bed Mobility Assistance: Assist for RLE into bed.  Pt using UE properly to self assist trunk.  Transfers Transfers: Sit to Stand;Stand to Sit Sit to Stand: 4: Min assist;With upper extremity assist;With armrests;From chair/3-in-1 Stand to Sit: 4: Min guard;With upper extremity assist;To bed Details for Transfer Assistance: Min cues for hand placement and LE management.  Ambulation/Gait Ambulation/Gait Assistance: 4: Min assist Ambulation Distance (Feet): 15 Feet (then another 15') Assistive device: Rolling walker Ambulation/Gait Assistance Details: min cues for sequencing/technique with pt able to self correct most errors.  Gait Pattern: Step-to pattern;Trunk flexed;Decreased stride length Gait velocity: decreased Stairs: No Wheelchair Mobility Wheelchair Mobility: No    Exercises Total Joint  Exercises Ankle Circles/Pumps: AROM;20 reps;Both Quad Sets: AROM;Right;10 reps Short Arc QuadBarbaraann Boys;Right;10 reps Heel Slides: AAROM;Right;10 reps (Pt only able to perform approx 30 deg of knee flex. ) Hip ABduction/ADduction: AAROM;Right;10 reps Straight Leg Raises: AAROM;Right;10 reps   PT Diagnosis:    PT Problem List:   PT Treatment Interventions:     PT Goals Acute Rehab PT Goals PT Goal Formulation: With patient Time For Goal Achievement: 10/30/11 Potential to Achieve Goals: Good Pt will go Supine/Side to Sit: with supervision PT Goal: Supine/Side to Sit - Progress: Progressing toward goal Pt will go Sit to Stand: with supervision PT Goal: Sit to Stand - Progress: Progressing toward goal Pt will go Stand to Sit: with supervision PT Goal: Stand to Sit - Progress: Progressing toward goal Pt will Ambulate: 51 - 150 feet;with supervision;with least restrictive assistive device PT Goal: Ambulate - Progress: Progressing toward goal  Visit Information  Last PT Received On: 11/01/11 Assistance Needed: +1    Subjective Data  Subjective: I'm concerned about my HR being so high.  Patient Stated Goal: to get up   Cognition  Overall Cognitive Status: Appears within functional limits for tasks assessed/performed Arousal/Alertness: Awake/alert Orientation Level: Appears intact for tasks assessed Behavior During Session: [facility] for tasks performed    Balance     End of Session PT - End of Session Equipment Utilized During Treatment: Right knee immobilizer Activity Tolerance: Patient tolerated treatment well Patient left: in bed;with call bell/phone within reach Nurse Communication: Mobility status    Page, Meribeth Mattes 11/01/2011, 1:46 PM

## 2011-11-01 NOTE — Progress Notes (Signed)
Physical Therapy Treatment Patient Details Name: Wendy Reid MRN: [id number] DOB: [birth year] Today's Date: 11/01/2011 Time: 0865-7846 PT Time Calculation (min): 26 min  PT Assessment / Plan / Recommendation Comments on Treatment Session  Pt continues to be motivated to do her best with increasing ambulation distance today.  Possible D/C today or tomorrow.     Follow Up Recommendations  Skilled nursing facility    Barriers to Discharge        Equipment Recommendations  Defer to next venue    Recommendations for Other Services OT consult  Frequency 7X/week   Plan Discharge plan remains appropriate    Precautions / Restrictions Precautions Precautions: Knee Required Braces or Orthoses: Knee Immobilizer - Right Knee Immobilizer - Right: On except when in CPM;On when out of bed or walking Restrictions Weight Bearing Restrictions: No Other Position/Activity Restrictions: WBAT   Pertinent Vitals/Pain 3/10    Mobility  Bed Mobility Bed Mobility: Sit to Supine Supine to Sit: 4: Min assist;HOB elevated Details for Bed Mobility Assistance: Assist for RLE into bed.  Pt using UE properly to self assist trunk.  Transfers Transfers: Sit to Stand;Stand to Sit Sit to Stand: 4: Min assist;From elevated surface;With upper extremity assist;From bed Stand to Sit: 4: Min guard;With upper extremity assist;With armrests;To chair/3-in-1 Details for Transfer Assistance: Several standing reps performed to address toileting and pts request to put on her own PJ's.  Cues for hand placement and LE management and technique with donning bottoms.  Ambulation/Gait Ambulation/Gait Assistance: 4: Min assist Ambulation Distance (Feet): 80 Feet Assistive device: Rolling walker Ambulation/Gait Assistance Details: Cues for upright and relaxed posture, min cues for sequencing/technique.  Gait Pattern: Step-to pattern;Trunk flexed;Decreased stride length Gait velocity: decreased    Exercises      PT Diagnosis:    PT Problem List:   PT Treatment Interventions:     PT Goals Acute Rehab PT Goals PT Goal Formulation: With patient Time For Goal Achievement: 10/30/11 Potential to Achieve Goals: Good Pt will go Supine/Side to Sit: with supervision PT Goal: Supine/Side to Sit - Progress: Progressing toward goal Pt will go Sit to Stand: with supervision PT Goal: Sit to Stand - Progress: Progressing toward goal Pt will go Stand to Sit: with supervision PT Goal: Stand to Sit - Progress: Progressing toward goal Pt will Ambulate: 51 - 150 feet;with supervision;with least restrictive assistive device PT Goal: Ambulate - Progress: Progressing toward goal  Visit Information  Last PT Received On: 11/01/11 Assistance Needed: +1    Subjective Data  Subjective: My pain was ok until I saw you Patient Stated Goal: to get up   Cognition  Overall Cognitive Status: Appears within functional limits for tasks assessed/performed Arousal/Alertness: Awake/alert Orientation Level: Appears intact for tasks assessed Behavior During Session: [facility] for tasks performed    Balance     End of Session PT - End of Session Equipment Utilized During Treatment: Right knee immobilizer Activity Tolerance: Patient tolerated treatment well Patient left: in chair;with call bell/phone within reach (with MD present.) Nurse Communication: Mobility status CPM Right Knee CPM Right Knee: Off    Page, Meribeth Mattes 11/01/2011, 10:41 AM

## 2011-11-01 NOTE — Progress Notes (Signed)
Occupational Therapy Treatment Patient Details Name: Wendy Reid MRN: [id number] DOB: 04/12/1951 Today's Date: 11/01/2011 Time: 4098-1191 OT Time Calculation (min): 31 min   Equipment Recommendations  Defer to next venue             Precautions / Restrictions Precautions Precautions: Knee Required Braces or Orthoses: Knee Immobilizer - Right Knee Immobilizer - Right: On except when in CPM;On when out of bed or walking Restrictions Weight Bearing Restrictions: No Other Position/Activity Restrictions: WBAT       ADL  Grooming: Performed;Wash/dry hands;Supervision/safety Where Assessed - Grooming: Supported standing;Other (comment) (with walker) Toilet Transfer: Performed;Min guard Toilet Transfer Method: Sit to Barista: Raised toilet seat with arms (or 3-in-1 over toilet);Extra wide drop arm bedside commode Toileting - Clothing Manipulation and Hygiene: Performed;Supervision/safety Where Assessed - Engineer, mining and Hygiene: Standing Tub/Shower Transfer: Performed;Moderate assistance;Other (comment) (shower transfer) Transfers/Ambulation Related to ADLs: Pt plans to go to SNF for more rehab prior to DC home      OT Goals ADL Goals ADL Goal: Grooming - Progress: Progressing toward goals ADL Goal: Toilet Transfer - Progress: Progressing toward goals ADL Goal: Toileting - Clothing Manipulation - Progress: Progressing toward goals  Visit Information  Last OT Received On: 11/01/11 Assistance Needed: +1       Prior Functioning  Home Living Lives With: Son Available Help at Discharge: Skilled Nursing Facility Type of Home: House Home Access: Stairs to enter Secretary/administrator of Steps: 1 Entrance Stairs-Rails: None Home Layout: Two level;Able to live on main level with bedroom/bathroom Bathroom Shower/Tub: Health visitor: Standard Home Adaptive Equipment: None Prior Function Level of  Independence: Independent Able to Take Stairs?: Yes Driving: Yes Vocation:  (worked as Charity fundraiser) Musician: No difficulties Dominant Hand: Right    Cognition  Overall Cognitive Status: Appears within functional limits for tasks assessed/performed Arousal/Alertness: Awake/alert Orientation Level: Appears intact for tasks assessed Behavior During Session: [facility] for tasks performed    Mobility Bed Mobility Bed Mobility: Sit to Supine Sit to Supine: 4: Min assist;HOB flat Details for Bed Mobility Assistance: Assist for RLE into bed.  Pt using UE properly to self assist trunk.  Transfers Sit to Stand: 4: Min assist;With upper extremity assist;With armrests;From chair/3-in-1 Stand to Sit: 4: Min guard;With upper extremity assist;To bed Details for Transfer Assistance: Min cues for hand placement and LE management.          End of Session  Pt left in bed with call bell and phone within reach.   [name] 11/01/2011, 2:53 PM

## 2011-11-01 NOTE — Progress Notes (Signed)
Subjective: 3 Days Post-Op Procedure(s) (LRB): TOTAL KNEE ARTHROPLASTY (Right) Patient reports pain as mild.    Objective: Vital signs in last 24 hours: Temp:  [98.3 F (36.8 C)-100.1 F (37.8 C)] 99.2 F (37.3 C) (05/27 0515) Pulse Rate:  [102-127] 127  (05/27 0515) Resp:  [16-18] 17  (05/27 0515) BP: (125-141)/(78-85) 125/84 mmHg (05/27 0515) SpO2:  [96 %-97 %] 96 % (05/27 0515)  Intake/Output from previous day: 05/26 0701 - 05/27 0700 In: 860 [P.O.:860] Out: 2000 [Urine:2000] Intake/Output this shift:     Basename 11/01/11 0429 10/31/11 0825 10/30/11 0438  HGB 8.2* 8.3* 9.5*    Basename 11/01/11 0429 10/31/11 0825  WBC 8.4 8.5  RBC 2.88* 2.96*  HCT 24.8* 25.4*  PLT 252 228    Basename 10/30/11 0438  NA 134*  K 3.9  CL 98  CO2 24  BUN 9  CREATININE 0.54  GLUCOSE 157*  CALCIUM 9.1   No results found for this basename: LABPT:2,INR:2 in the last 72 hours  Incision: dressing C/D/I  Assessment/Plan: 3 Days Post-Op Procedure(s) (LRB): TOTAL KNEE ARTHROPLASTY (Right) Up with therapy SNF likely Monday  Fartun Paradiso A 11/01/2011, 10:18 AM

## 2011-11-01 NOTE — Progress Notes (Signed)
Utilization review completed.  

## 2011-11-02 ENCOUNTER — Encounter (HOSPITAL_COMMUNITY): Payer: Self-pay | Admitting: Specialist

## 2011-11-02 DIAGNOSIS — R000 Tachycardia, unspecified: Secondary | ICD-10-CM

## 2011-11-02 DIAGNOSIS — M159 Polyosteoarthritis, unspecified: Secondary | ICD-10-CM

## 2011-11-02 DIAGNOSIS — R112 Nausea with vomiting, unspecified: Secondary | ICD-10-CM

## 2011-11-02 DIAGNOSIS — I10 Essential (primary) hypertension: Secondary | ICD-10-CM

## 2011-11-02 LAB — TYPE AND SCREEN
ABO/RH(D): B POS
Antibody Screen: NEGATIVE
Unit division: 0

## 2011-11-02 LAB — URINE CULTURE: Culture  Setup Time: 201305280122

## 2011-11-02 LAB — CARDIAC PANEL(CRET KIN+CKTOT+MB+TROPI)
CK, MB: 2.2 ng/mL (ref 0.3–4.0)
CK, MB: 2.3 ng/mL (ref 0.3–4.0)
Relative Index: 1 (ref 0.0–2.5)
Relative Index: 1.1 (ref 0.0–2.5)
Total CK: 218 U/L — ABNORMAL HIGH (ref 7–177)
Troponin I: <0.3 ng/mL (ref <0.30)
Troponin I: <0.3 ng/mL (ref <0.30)

## 2011-11-02 LAB — CBC
MCH: 29.2 pg (ref 26.0–34.0)
MCHC: 33.7 g/dL (ref 30.0–36.0)
Platelets: 203 10*3/uL (ref 150–400)

## 2011-11-02 LAB — TSH: TSH: 1.285 u[IU]/mL (ref 0.350–4.500)

## 2011-11-02 LAB — FOLATE: Folate: 6.4 ng/mL

## 2011-11-02 MED ORDER — FERROUS SULFATE 325 (65 FE) MG PO TABS: 325.0000 mg | ORAL_TABLET | Freq: Three times a day (TID) | ORAL | Status: DC

## 2011-11-02 MED ORDER — ENOXAPARIN SODIUM 30 MG/0.3ML ~~LOC~~ SOLN: 30.0000 mg | Freq: Two times a day (BID) | SUBCUTANEOUS | Status: DC

## 2011-11-02 MED ORDER — METHOCARBAMOL 500 MG PO TABS: 500.0000 mg | ORAL_TABLET | Freq: Four times a day (QID) | ORAL | Status: AC | PRN

## 2011-11-02 MED ORDER — OXYCODONE HCL 5 MG PO TABS: 5.0000 mg | ORAL_TABLET | ORAL | Status: AC | PRN

## 2011-11-02 MED ORDER — METOPROLOL TARTRATE 1 MG/ML IV SOLN
5.0000 mg | Freq: Once | INTRAVENOUS | Status: AC
  Administered 2011-11-02: 5 mg via INTRAVENOUS
  Filled 2011-11-02: qty 5

## 2011-11-02 NOTE — Progress Notes (Signed)
Physical Therapy Treatment Patient Details Name: Wendy Reid MRN: [id number] DOB: 02/16/1951 Today's Date: 11/02/2011 Time: 3086-5784 PT Time Calculation (min): 17 min  PT Assessment / Plan / Recommendation Comments on Treatment Session  Pt continues to progress with ambulation distance.  Will benefit from SNF to assist in returning her to PLOF    Follow Up Recommendations  Skilled nursing facility    Barriers to Discharge        Equipment Recommendations  Defer to next venue    Recommendations for Other Services    Frequency 7X/week   Plan Discharge plan remains appropriate    Precautions / Restrictions Precautions Precautions: Knee Required Braces or Orthoses: Knee Immobilizer - Right Knee Immobilizer - Right: On except when in CPM;On when out of bed or walking Restrictions Weight Bearing Restrictions: No Other Position/Activity Restrictions: WBAT   Pertinent Vitals/Pain 4/10 pain    Mobility  Bed Mobility Bed Mobility: Sit to Supine Sit to Supine: 4: Min assist;HOB flat Details for Bed Mobility Assistance: Assist for RLE into bed.  Pt using UE properly to self assist trunk.  Transfers Transfers: Stand to Sit Stand to Sit: 4: Min guard;With upper extremity assist;To elevated surface;To bed Details for Transfer Assistance: Cues for hand placement and LE management Ambulation/Gait Ambulation/Gait Assistance: 4: Min assist Ambulation Distance (Feet): 100 Feet Assistive device: Rolling walker Ambulation/Gait Assistance Details: Min cues for not stepping too far inside of RW Gait Pattern: Step-to pattern;Trunk flexed;Decreased stride length Gait velocity: decreased Stairs: No Wheelchair Mobility Wheelchair Mobility: No    Exercises     PT Diagnosis:    PT Problem List:   PT Treatment Interventions:     PT Goals Acute Rehab PT Goals PT Goal Formulation: With patient Time For Goal Achievement: 10/30/11 Potential to Achieve Goals: Good Pt will go Sit  to Supine/Side: with supervision PT Goal: Sit to Supine/Side - Progress: Progressing toward goal Pt will go Sit to Stand: with supervision PT Goal: Sit to Stand - Progress: Progressing toward goal Pt will go Stand to Sit: with supervision PT Goal: Stand to Sit - Progress: Progressing toward goal Pt will Ambulate: 51 - 150 feet;with supervision;with least restrictive assistive device PT Goal: Ambulate - Progress: Progressing toward goal  Visit Information  Last PT Received On: 11/02/11 Assistance Needed: +1    Subjective Data  Subjective: My pain is not even a real 4/10 Patient Stated Goal: to get up   Cognition  Overall Cognitive Status: Appears within functional limits for tasks assessed/performed Arousal/Alertness: Awake/alert Orientation Level: Appears intact for tasks assessed Behavior During Session: [facility] for tasks performed    Balance     End of Session PT - End of Session Equipment Utilized During Treatment: Right knee immobilizer Activity Tolerance: Patient tolerated treatment well Patient left: in bed;with call bell/phone within reach;with family/visitor present    Lessie Dings 11/02/2011, 4:39 PM

## 2011-11-02 NOTE — Progress Notes (Signed)
VS charted for wrong time. Done at [facility]

## 2011-11-02 NOTE — Discharge Summary (Signed)
Physician Discharge Summary  Patient ID: Wendy Reid MRN: [id number] DOB/AGE: 07/02/1950 61 y.o.  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 Valma Cava patient was taken to the OR were right total knee arthroplasty was performed without any complications 1 Hemovac drain was left in place. Patient was discharged return to orthopedic floor with IV pain medicine antibiotics and Lovenox for DVT prophylaxis. Patient then for 4 day postoperative course gross Memorial Day weekend but she participated in total knee protocol. She was able weaned off IV medicines well. She did have progressive loss worsening of postoperative blood loss anemia which did become symptomatic her hemoglobin did drop down to 8.5 after being admitted 10.5. Patient was tachycardic with heart rates ranging from [phone]. He did have a little pressure in her chest so a medical consult was requested initial cardiac enzymes were unremarkable he felt it was related to her postoperative anemia transfused 2 units of packed red blood cells patient at present time is feeling good without any chest discomfort with her heart rate of 109. With a nice stable blood pressure. Patient's wound is benign for any signs of infection well approximated with Steri-Strips her leg is neuromotor vascularly intact. Patient's admission wishes were to go to a skilled nursing facility I think that's quite appropriate this time due to her progression with  physical therapy and arrangements will be made for that transfer. We'll await final disposition on a medical evaluation this morning if it's okay with medicine we'll transfer her today  Consults: Primary care medical consult  Significant Diagnostic Studies: Routine total knee arthroplasty blood work along with cardiac enzyme  Treatments: Routine total knee arthroplasty treatments along with transfusion of 2 units of packed red blood cells with improvement her hemoglobin from 8.5 to 10.2 Discharge Exam: Blood pressure 132/87, pulse 109, temperature 99.2 F (37.3 C), temperature source Oral, resp. rate 16, height 5' 1.5" (1.562 m), weight 98.476 kg (217 lb 1.6 oz), SpO2 93.00%. Patient is conscious alert appropriate appears to be very comfortable for a positive attitude at this time in no distress right lower extremity is neuromotor vascularly intact wound is approximated with Steri-Strips no signs of infection. There  Disposition: Medically cleared today patient will be discharged to skilled nursing facility continued outpatient physical therapy. This will have a 2 week followup appointment with Dr. Thomasena Edis [phone]. Patient will continue on Lovenox 30 mg subcutaneous every 12 hours for a total of 14 days   Medication List  As of 11/02/2011  7:25 AM   ASK your doctor about these medications         albuterol (2.5 MG/3ML) 0.083% nebulizer solution   Commonly known as: PROVENTIL   Take 2.5 mg by nebulization every evening. For wheezing      amLODipine 5 MG tablet   Commonly known as: NORVASC   Take 5 mg by mouth daily with breakfast.      cetirizine 10 MG tablet   Commonly known as: ZYRTEC   Take 10 mg by mouth as needed.      fish oil-omega-3 fatty acids 1000  MG capsule   Take 2 g by mouth daily.      ibuprofen 800 MG tablet   Commonly known as: ADVIL,MOTRIN   Take 800 mg by mouth every 6 (six) hours as needed. For pain      levalbuterol 45 MCG/ACT inhaler   Commonly known as:  XOPENEX HFA   Inhale 1-2 puffs into the lungs every 4 (four) hours as needed. For shortness of breath      pantoprazole 40 MG tablet   Commonly known as: PROTONIX   Take 40 mg by mouth daily with breakfast.      potassium chloride SA 20 MEQ tablet   Commonly known as: K-DUR,KLOR-CON   Take 10 mEq by mouth daily with breakfast.      potassium chloride 10 MEQ tablet   Commonly known as: K-DUR,KLOR-CON   Take 10 mEq by mouth every morning.      salmeterol 50 MCG/DOSE diskus inhaler   Commonly known as: SEREVENT   Inhale 1 puff into the lungs 2 (two) times daily as needed. For shortness of breath             Signed: Jamelle Rushing 11/02/2011, 7:25 AM

## 2011-11-02 NOTE — Progress Notes (Signed)
CSW assisting with d/c planning. [employer] insurance has provided prior authorization for [facility]. D/C on hold for today due to unstable medical status. Will send updated MD progress note to insurance once pt is stable for d/c. Pt/SNF are aware of d/c plan. Will follow to assist with d/c planning to SNF.  Cori Razor LCSW [phone]

## 2011-11-02 NOTE — Progress Notes (Signed)
Subjective: Patient states she does not feel too well today. States she awoke with heart racing. No CP, no SOB.  Objective: Vital signs in last 24 hours: Filed Vitals:   11/02/11 0100 11/02/11 0110 11/02/11 0545 11/02/11 1105  BP: 137/81 137/81 132/87 136/81  Pulse: 109 109    Temp: 99.8 F (37.7 C) 99.8 F (37.7 C) 99.2 F (37.3 C)   TempSrc: Oral Oral Oral   Resp: 16 16 16 16   Height:      Weight:      SpO2: 96% 96% 93% 97%    Intake/Output Summary (Last 24 hours) at 11/02/11 1203 Last data filed at 11/02/11 1106  Gross per 24 hour  Intake 1649.58 ml  Output   2220 ml  Net -570.42 ml    Weight change:   General: Alert, awake, oriented x3, in no acute distress. HEENT: No bruits, no goiter. Heart: Tacchycardia, without murmurs, rubs, gallops. Lungs: Clear to auscultation bilaterally. Abdomen: Soft, nontender, nondistended, positive bowel sounds. Extremities: No clubbing cyanosis or edema with positive pedal pulses. Neuro: Grossly intact, nonfocal.   Lab Results:  [facility] 11/01/11 1526  NA 134*  K 3.7  CL 98  CO2 27  GLUCOSE 175*  BUN 9  CREATININE 0.68  CALCIUM 9.1  MG 2.1  PHOS --    Basename 11/01/11 1526  AST 24  ALT 19  ALKPHOS 85  BILITOT 0.4  PROT 7.5  ALBUMIN 3.0*   No results found for this basename: LIPASE:2,AMYLASE:2 in the last 72 hours  Basename 11/02/11 0615 11/01/11 0429  WBC 8.0 8.4  NEUTROABS -- --  HGB 10.2* 8.2*  HCT 30.3* 24.8*  MCV 86.8 86.1  PLT 203 252    Basename 11/02/11 0615 11/01/11 1526  CKTOTAL 230* 230*  CKMB 2.2 2.9  CKMBINDEX -- --  TROPONINI <0.30 <0.30   No components found with this basename: POCBNP:3 No results found for this basename: DDIMER:2 in the last 72 hours No results found for this basename: HGBA1C:2 in the last 72 hours No results found for this basename: CHOL:2,HDL:2,LDLCALC:2,TRIG:2,CHOLHDL:2,LDLDIRECT:2 in the last 72 hours  Basename 11/01/11 1526  TSH 1.285  T4TOTAL --  T3FREE --   THYROIDAB --    Basename 11/01/11 1526  VITAMINB12 256  FOLATE 6.4  FERRITIN 190  TIBC 248*  IRON 20*  RETICCTPCT --    Micro Results: No results found for this or any previous visit (from the past 240 hour(s)).  Studies/Results: Dg Chest Port 1 View  11/01/2011  *RADIOLOGY REPORT*  Clinical Data: Tachycardia.  Evaluate for infiltrate.  Postop knee replacement.  PORTABLE CHEST - 1 VIEW  Comparison: 12/20/2010  Findings: The heart, mediastinal, and hilar contours are normal. The pulmonary vascularity is normal and the lungs are clear.  There is no pleural effusion or pneumothorax.  No acute or suspicious osseous abnormality.  Upper abdomen is unremarkable.  IMPRESSION: No acute cardiopulmonary disease.  Original Report Authenticated By: Britta Mccreedy, M.D.    Medications:     . acetaminophen  650 mg Oral Once  . amLODipine  5 mg Oral Q breakfast  . diphenhydrAMINE  25 mg Oral Once  . docusate sodium  100 mg Oral BID  . enoxaparin  30 mg Subcutaneous Q12H  . ferrous sulfate  325 mg Oral TID PC  . furosemide  20 mg Intravenous Once  . loratadine  10 mg Oral Daily  . metoprolol  5 mg Intravenous Once  . pantoprazole  40 mg Oral Q breakfast  .  potassium chloride  10 mEq Oral q morning - 10a    Assessment: Principal Problem:  *Osteoarthritis of knee Active Problems:  Tachycardia  Anemia  Acute blood loss anemia  HTN (hypertension)  GERD (gastroesophageal reflux disease)  Asthma in adult   Plan: #1 tachycardia Likely secondary to symptomatic anemia. Patient states doesn't feel too well today and awoke from a nap with her heart racing. Cardiac enzymes which was cycled were negative x3. TSH which was obtained was within normal limits. Chest x-ray which was obtained was negative. Urinalysis which was done was negative. Comprehensive metabolic profile which was done was unremarkable. Patient is status post 2 units packed red blood cells with appropriate response with a  hemoglobin now up to 10.2. Patient's heart rate this morning was 109 and improved. Repeat heart rate was 117 a possibly an hour ago. Will give 1 dose of Lopressor 5 mg IV x1. Will monitor over the course of the day. If no significant improvement may consider changing patient's Norvasc to Lopressor 12.5 mg by mouth twice a day. Monitor for now.  #2 acute blood loss anemia/iron deficiency anemia Likely secondary to perioperative blood loss.Status post 2 units of packed red blood cells. Appropriate response with hemoglobin at 10.2. No overt GI bleed. Follow H&H and monitor.  #3gastroesophageal reflux disease  Stable.continue Protonix.   #4 hypertension  Stable continue home regimen of Norvasc.   #5 asthma  Stable. Continue Claritin, Xopenex, Serevent when necessary albuterol nebs as needed.   #6 end-stage bilateral OTO osteoarthritis right greater than left status post right total knee arthroplasty  Per primary team.  #7.Dispo Consider monitoring for the next 24 hours prior to discharge to ensure heart rate is controlled.    LOS: 4 days   Wendy Reid [phone] 11/02/2011, 12:03 PM

## 2011-11-02 NOTE — Progress Notes (Signed)
Subjective: Patient overall feeling real good today no recurrent chest pressure no shortness of breath knee feels pretty good right now   Objective: Vital signs in last 24 hours: Temp:  [98.1 F (36.7 C)-99.8 F (37.7 C)] 99.2 F (37.3 C) (05/28 0545) Pulse Rate:  [103-148] 109  (05/28 0110) Resp:  [16-18] 16  (05/28 0545) BP: (117-145)/(74-88) 132/87 mmHg (05/28 0545) SpO2:  [93 %-100 %] 93 % (05/28 0545)  Intake/Output from previous day: 05/27 0701 - 05/28 0700 In: 1649.6 [P.O.:720; I.V.:250; Blood:679.6] Out: 2120 [Urine:2120] Intake/Output this shift:     Basename 11/02/11 0615 11/01/11 0429 10/31/11 0825  HGB 10.2* 8.2* 8.3*    Basename 11/02/11 0615 11/01/11 0429  WBC 8.0 8.4  RBC 3.49* 2.88*  HCT 30.3* 24.8*  PLT 203 252    Basename 11/01/11 1526  NA 134*  K 3.7  CL 98  CO2 27  BUN 9  CREATININE 0.68  GLUCOSE 175*  CALCIUM 9.1   No results found for this basename: LABPT:2,INR:2 in the last 72 hours  Patient is conscious alert and appropriate appears to be in no distress appears to be very up at this time. She is denying any discomfort in her right knee today. Examination of her knee she has no erythema pressure blisters or drainage from the wound she does have 2 small dry read pressure blisters medial aspect of her knee no signs of infection or wound is well approximated with Steri-Strips. Calf and thigh are soft nontender she is neuromotor vascularly intact at the foot Assessment/Plan: Postop day #4 status post right total knee arthroplasty doing very well Postoperative tachycardia initial cardiac enzymes negative acute blood loss anemia was corrected with 2 units packed red blood cells patient tolerated it well tachycardia is improving without any shortness of breath or track chest pressure Hypertension stable Asthma stable GERD stable Barrett's esophagitis stable  At this time we'll allow medical service Dr. Janee Morn reevaluate her today in these  comfortable with being discharged to a skilled nursing facility we'll make those arrangements but from the orthopedic standpoint patient was ready for discharge to skilled nursing facility  Wendy Reid 11/02/2011, 7:21 AM

## 2011-11-02 NOTE — Progress Notes (Addendum)
Physical Therapy Treatment Patient Details Name: Wendy Reid MRN: [id number] DOB: 1951/04/13 Today's Date: 11/02/2011 Time: 0833-0900 PT Time Calculation (min): 27 min  PT Assessment / Plan / Recommendation Comments on Treatment Session  Pt progressing well, stating that she feels better today.     Follow Up Recommendations  Skilled nursing facility    Barriers to Discharge        Equipment Recommendations  Defer to next venue    Recommendations for Other Services    Frequency 7X/week   Plan Discharge plan remains appropriate    Precautions / Restrictions Precautions Precautions: Knee Required Braces or Orthoses: Knee Immobilizer - Right Knee Immobilizer - Right: On except when in CPM;On when out of bed or walking Restrictions Weight Bearing Restrictions: No Other Position/Activity Restrictions: WBAT   Pertinent Vitals/Pain 3/10    Mobility  Bed Mobility Bed Mobility: Not assessed (Pt was up ambulating with RN when PT arrived) Transfers Transfers: Stand to Sit Stand to Sit: 4: Min guard;With upper extremity assist;With armrests;To chair/3-in-1 Details for Transfer Assistance: Min cues for hand placement and LE management.  Ambulation/Gait Ambulation Distance: 28' Ambulation/Gait Assistance: 4: Min assist Assistive device: Rolling walker Ambulation/Gait Assistance Details: Min cues for upright posture and safety.  Gait Pattern: Step-to pattern;Trunk flexed;Decreased stride length Gait velocity: decreased    Exercises Total Joint Exercises Ankle Circles/Pumps: AROM;20 reps;Both Quad Sets: AROM;Right;10 reps Short Arc QuadBarbaraann Boys;Right;10 reps Heel Slides: AAROM;Right;10 reps Hip ABduction/ADduction: AAROM;Right;10 reps Straight Leg Raises: AAROM;Right;10 reps   PT Diagnosis:    PT Problem List:   PT Treatment Interventions:     PT Goals Acute Rehab PT Goals PT Goal Formulation: With patient Time For Goal Achievement: 10/30/11 Potential to Achieve  Goals: Good Pt will go Sit to Stand: with supervision PT Goal: Sit to Stand - Progress: Progressing toward goal Pt will go Stand to Sit: with supervision PT Goal: Stand to Sit - Progress: Progressing toward goal Pt will Ambulate: 51 - 150 feet;with supervision;with least restrictive assistive device PT Goal: Ambulate - Progress: Progressing toward goal  Visit Information  Last PT Received On: 11/02/11 Assistance Needed: +1    Subjective Data  Subjective: I'm feeling better today Patient Stated Goal: to get up   Cognition  Overall Cognitive Status: Appears within functional limits for tasks assessed/performed Arousal/Alertness: Awake/alert Orientation Level: Appears intact for tasks assessed Behavior During Session: [facility] for tasks performed    Balance     End of Session PT - End of Session Equipment Utilized During Treatment: Right knee immobilizer Activity Tolerance: Patient tolerated treatment well Patient left: in chair;with call bell/phone within reach Nurse Communication: Mobility status    Page, Meribeth Mattes 11/02/2011, 9:11 AM

## 2011-11-03 ENCOUNTER — Inpatient Hospital Stay (HOSPITAL_COMMUNITY): Payer: PRIVATE HEALTH INSURANCE

## 2011-11-03 DIAGNOSIS — R000 Tachycardia, unspecified: Secondary | ICD-10-CM

## 2011-11-03 DIAGNOSIS — I10 Essential (primary) hypertension: Secondary | ICD-10-CM

## 2011-11-03 DIAGNOSIS — R112 Nausea with vomiting, unspecified: Secondary | ICD-10-CM

## 2011-11-03 LAB — BASIC METABOLIC PANEL
Calcium: 9.4 mg/dL (ref 8.4–10.5)
GFR calc Af Amer: >90 mL/min (ref >90)
GFR calc non Af Amer: >90 mL/min (ref >90)
Sodium: 137 mEq/L (ref 135–145)

## 2011-11-03 LAB — D-DIMER, QUANTITATIVE: D-Dimer, Quant: 7.16 ug/mL-FEU — ABNORMAL HIGH (ref 0.00–0.48)

## 2011-11-03 LAB — CBC
Platelets: 252 10*3/uL (ref 150–400)
RBC: 3.52 MIL/uL — ABNORMAL LOW (ref 3.87–5.11)
WBC: 7 10*3/uL (ref 4.0–10.5)

## 2011-11-03 MED ORDER — IOHEXOL 300 MG/ML  SOLN
100.0000 mL | Freq: Once | INTRAMUSCULAR | Status: AC | PRN
  Administered 2011-11-03: 100 mL via INTRAVENOUS

## 2011-11-03 NOTE — Progress Notes (Addendum)
Subjective: Pt states that she has never been told by anyone that she had tachycardia. She has been followed by a Cardiologist for several years because of chest pain. She states that she has had several cardiac catheterization and a stress test last year which have all been negative for CAD. Review of stress test from 04/2011 shows normal LVF and no wall motion abnormalities.  Presently pt has no chest pain and is only complaining of intermittent palpitations.    Objective: Filed Vitals:   11/02/11 1830 11/02/11 2051 11/03/11 0146 11/03/11 0657  BP: 122/82 125/78 130/80 132/84  Pulse: 92 103 100 104  Temp: 98.3 F (36.8 C) 98.9 F (37.2 C) 98.7 F (37.1 C) 98.6 F (37 C)  TempSrc: Oral  Oral Oral  Resp: 16 16 16 16   Height:      Weight:      SpO2: 100% 94% 95% 96%   Weight change:   Intake/Output Summary (Last 24 hours) at 11/03/11 1054 Last data filed at 11/03/11 0800  Gross per 24 hour  Intake    720 ml  Output    200 ml  Net    520 ml    General: Alert, awake, oriented x3, in no acute distress.  HEENT: [place]/AT PEERL, EOMI Neck: Trachea midline,  no masses, no thyromegal,y no JVD, no carotid bruit OROPHARYNX:  Moist, No exudate/ erythema/lesions.  Heart: Regular rate and rhythm, without murmurs, rubs, gallops, PMI non-displaced, no heaves or thrills on palpation.  Lungs: Clear to auscultation, no wheezing or rhonchi noted. No increased vocal fremitus resonant to percussion  Abdomen: Soft, nontender, nondistended, positive bowel sounds, no masses no hepatosplenomegaly noted..  Neuro: No focal neurological deficits noted cranial nerves II through XII grossly intact. DTRs 2+ bilaterally upper and lower extremities. Strength 5 out of 5 in bilateral upper extremities.     Lab Results:  Basename 11/03/11 0445 11/01/11 1526  NA 137 134*  K 3.7 3.7  CL 98 98  CO2 30 27  GLUCOSE 112* 175*  BUN 7 9  CREATININE 0.71 0.68  CALCIUM 9.4 9.1  MG -- 2.1  PHOS -- --     Basename 11/01/11 1526  AST 24  ALT 19  ALKPHOS 85  BILITOT 0.4  PROT 7.5  ALBUMIN 3.0*   No results found for this basename: LIPASE:2,AMYLASE:2 in the last 72 hours  Basename 11/03/11 0445 11/02/11 0615  WBC 7.0 8.0  NEUTROABS -- --  HGB 10.2* 10.2*  HCT 30.9* 30.3*  MCV 87.8 86.8  PLT 252 203    Basename 11/02/11 1527 11/02/11 0615 11/01/11 1526  CKTOTAL 218* 230* 230*  CKMB 2.3 2.2 2.9  CKMBINDEX -- -- --  TROPONINI <0.30 <0.30 <0.30   No components found with this basename: POCBNP:3 No results found for this basename: DDIMER:2 in the last 72 hours No results found for this basename: HGBA1C:2 in the last 72 hours No results found for this basename: CHOL:2,HDL:2,LDLCALC:2,TRIG:2,CHOLHDL:2,LDLDIRECT:2 in the last 72 hours  Basename 11/01/11 1526  TSH 1.285  T4TOTAL --  T3FREE --  THYROIDAB --    Basename 11/01/11 1526  VITAMINB12 256  FOLATE 6.4  FERRITIN 190  TIBC 248*  IRON 20*  RETICCTPCT --    Micro Results: Recent Results (from the past 240 hour(s))  URINE CULTURE     Status: Normal   Collection Time   11/01/11  4:38 PM      Component Value Range Status Comment   Specimen Description URINE, CLEAN CATCH  Final    Special Requests NONE   Final    Culture  Setup Time [id number]   Final    Colony Count >=100,000 COLONIES/ML   Final    Culture     Final    Value: Multiple bacterial morphotypes present, none predominant. Suggest appropriate recollection if clinically indicated.   Report Status 11/02/2011 FINAL   Final     Studies/Results: Dg Chest Port 1 View  11/01/2011  *RADIOLOGY REPORT*  Clinical Data: Tachycardia.  Evaluate for infiltrate.  Postop knee replacement.  PORTABLE CHEST - 1 VIEW  Comparison: 12/20/2010  Findings: The heart, mediastinal, and hilar contours are normal. The pulmonary vascularity is normal and the lungs are clear.  There is no pleural effusion or pneumothorax.  No acute or suspicious osseous abnormality.  Upper  abdomen is unremarkable.  IMPRESSION: No acute cardiopulmonary disease.  Original Report Authenticated By: Britta Mccreedy, M.D.    Medications: I have reviewed the patient's current medications. Scheduled Meds:   . amLODipine  5 mg Oral Q breakfast  . docusate sodium  100 mg Oral BID  . enoxaparin  30 mg Subcutaneous Q12H  . ferrous sulfate  325 mg Oral TID PC  . loratadine  10 mg Oral Daily  . metoprolol  5 mg Intravenous Once  . pantoprazole  40 mg Oral Q breakfast  . potassium chloride  10 mEq Oral q morning - 10a   Continuous Infusions:  PRN Meds:.acetaminophen, acetaminophen, albuterol, alum & mag hydroxide-simeth, bisacodyl, diphenhydrAMINE, HYDROmorphone (DILAUDID) injection, levalbuterol, menthol-cetylpyridinium, methocarbamol (ROBAXIN) IV, methocarbamol, metoCLOPramide (REGLAN) injection, metoCLOPramide, ondansetron (ZOFRAN) IV, ondansetron, oxyCODONE, phenol, promethazine, salmeterol, zolpidem Assessment/Plan: Patient Active Hospital Problem List: Tachycardia (11/01/2011)   Assessment: In light of recent surgery, will check a d-dimer. If positive then will proceed with CT angiogram of chest to r/o PE.   Anemia (11/01/2011)   Assessment: Hb stable after transfusion.   HTN (hypertension) (11/01/2011)   Assessment: BP normal     LOS: 5 days   CT angiogram shows no CAD. Thus okay to go to rehab from my standpoint.

## 2011-11-03 NOTE — Progress Notes (Signed)
Physical Therapy Treatment Patient Details Name: Wendy Reid MRN: [id number] DOB: 06-13-1950 Today's Date: 11/03/2011 Time: 4696-2952 PT Time Calculation (min): 24 min  PT Assessment / Plan / Recommendation Comments on Treatment Session  Pt awaiting results of DDimer, therefore, deferred ambulation until results are in.  Did well with exercises.     Follow Up Recommendations  Skilled nursing facility    Barriers to Discharge        Equipment Recommendations  Defer to next venue    Recommendations for Other Services    Frequency 7X/week   Plan Discharge plan remains appropriate    Precautions / Restrictions Precautions Precautions: Knee Required Braces or Orthoses: Knee Immobilizer - Right Knee Immobilizer - Right: On except when in CPM;On when out of bed or walking Restrictions Weight Bearing Restrictions: No Other Position/Activity Restrictions: WBAT   Pertinent Vitals/Pain No pain    Mobility  Bed Mobility Bed Mobility: Not assessed (Pt in recliner)    Exercises Total Joint Exercises Ankle Circles/Pumps: AROM;20 reps;Both Quad Sets: AROM;Right;10 reps Short Arc QuadBarbaraann Reid;Right;10 reps Heel Slides: AAROM;Right;10 reps Hip ABduction/ADduction: AAROM;Right;10 reps Straight Leg Raises: AAROM;Right;10 reps   PT Diagnosis:    PT Problem List:   PT Treatment Interventions:     PT Goals Acute Rehab PT Goals PT Goal Formulation: With patient Time For Goal Achievement: 10/30/11 Potential to Achieve Goals: Good  Visit Information  Last PT Received On: 11/03/11 Assistance Needed: +1    Subjective Data  Subjective: My knee feels good Patient Stated Goal: to get up   Cognition  Overall Cognitive Status: Appears within functional limits for tasks assessed/performed Arousal/Alertness: Awake/alert Orientation Level: Appears intact for tasks assessed Behavior During Session: Anxious    Balance     End of Session PT - End of Session Activity  Tolerance: Patient tolerated treatment well Patient left: in chair;with call bell/phone within reach Nurse Communication: Other (comment) (Ok to do exercises in chair) CPM Right Knee CPM Right Knee: Off    Wendy Reid, Wendy Reid 11/03/2011, 11:48 AM

## 2011-11-03 NOTE — Progress Notes (Signed)
Patient cleared for discharge. Packet copied and placed in [place]. [facility] called for transportation. CSW met with patient. Patient aware that her insurance company will likely not pay ambulance bill. Patient will notify her son.  Ahlaya Ende C. Adya Wirz MSW, LCSW [phone]

## 2011-11-03 NOTE — Discharge Summary (Signed)
  Addendum to previous D/C summary dictated 11/02/2011 Pt kept over night to monitor her heart rate. Pt noted heart no longer pounding in chest but to Medical Hospitalist she still had some pressure. DDimer was elevated, so a CT chest to R/O PE was done and negative for PE of chest. Medical Hospitalist has cleared Wendy Reid for transfer to SNF without any change or recommendations to current D/C instructions.

## 2011-11-03 NOTE — Progress Notes (Signed)
Utilization review completed.  

## 2011-11-03 NOTE — Progress Notes (Signed)
Subjective:  Patient feeling good this morning she states she feels more formal she doesn't feel like her heart is pending out of her chest feels like she is ready to go to skilled nursing to Objective: Vital signs in last 24 hours: Temp:  [98.3 F (36.8 C)-98.9 F (37.2 C)] 98.7 F (37.1 C) (05/29 0146) Pulse Rate:  [92-117] 100  (05/29 0146) Resp:  [16] 16  (05/29 0146) BP: (122-144)/(78-88) 130/80 mmHg (05/29 0146) SpO2:  [94 %-100 %] 95 % (05/29 0146)  Intake/Output from previous day: 05/28 0701 - 05/29 0700 In: 840 [P.O.:840] Out: 200 [Urine:200] Intake/Output this shift: Total I/O In: 120 [P.O.:120] Out: 100 [Urine:100]   Basename 11/03/11 0445 11/02/11 0615 11/01/11 0429 10/31/11 0825  HGB 10.2* 10.2* 8.2* 8.3*    Basename 11/03/11 0445 11/02/11 0615  WBC 7.0 8.0  RBC 3.52* 3.49*  HCT 30.9* 30.3*  PLT 252 203    Basename 11/03/11 0445 11/01/11 1526  NA 137 134*  K 3.7 3.7  CL 98 98  CO2 30 27  BUN 7 9  CREATININE 0.71 0.68  GLUCOSE 112* 175*  CALCIUM 9.4 9.1   No results found for this basename: LABPT:2,INR:2 in the last 72 hours  Patient's conscious alert and appropriate appears to be very comfortable very of mood today. She denies any shortness of breath chest discomfort. Her right lower extremity dressing is clean and intact wound is well approximated with Steri-Strips no blisters no swelling no drainage no signs of infection her calf and thigh are soft and nontender is neuromotor vascularly intact at the foot  Assessment/Plan: Postop day #5 status post right total knee arthroplasty stable Acute postoperative blood loss anemia improve with 2 units packed red blood cells currently asymptomatic Postoperative tachycardia probably related to acute postoperative blood loss anemia improve Hypertension stable Asthma stable  Plan at this time from the orthopedic standpoint patient is ready for discharge to skilled nursing facility will await hospitalist of the  final evaluation and clearance for her discharge today. There is no change when current previous discharge planning and less medical hospitalist change her blood pressure medicine.   Jamelle Rushing 11/03/2011, 6:57 AM

## 2011-11-04 NOTE — Progress Notes (Signed)
Clinical Social Work Department CLINICAL SOCIAL WORK PLACEMENT NOTE 11/04/2011  Patient:  Wendy Reid, Wendy Reid  Account Number:  [id number] Admit date:  10/29/2011  Clinical Social Worker:  Cori Razor, LCSW  Date/time:  11/04/2011 07:27 AM  Clinical Social Work is seeking post-discharge placement for this patient at the following level of care:   SKILLED NURSING   (*CSW will update this form in Epic as items are completed)   10/30/2011  Patient/family provided with [facility] Department of Clinical Social Work's list of facilities offering this level of care within the geographic area requested by the patient (or if unable, by the patient's family).  10/30/2011  Patient/family informed of their freedom to choose among providers that offer the needed level of care, that participate in Medicare, Medicaid or managed care program needed by the patient, have an available bed and are willing to accept the patient.  10/30/2011  Patient/family informed of [facility]' ownership interest in [facility], as well as of the fact that they are under no obligation to receive care at this facility.  PASARR submitted to EDS on 11/02/2011 PASARR number received from EDS on 11/03/2011  FL2 transmitted to all facilities in geographic area requested by pt/family on  11/02/2011 FL2 transmitted to all facilities within larger geographic area on   Patient informed that his/her managed care company has contracts with or will negotiate with  certain facilities, including the following:     Patient/family informed of bed offers received:  11/02/2011 Patient chooses bed at [facility] Physician recommends and patient chooses bed at    Patient to be transferred to [facility] on  11/03/2011 Patient to be transferred to facility by [facility]  The following physician request were entered in Epic:   Additional Comments:  [employer] provided  prior approval for ST SNF placement but not ambulance transport. Pt aware transport will be an out of pocket cost.  Cori Razor LCSW [phone]

## 2011-12-03 ENCOUNTER — Other Ambulatory Visit: Payer: Self-pay | Admitting: Specialist

## 2011-12-03 DIAGNOSIS — Z96659 Presence of unspecified artificial knee joint: Secondary | ICD-10-CM

## 2011-12-19 ENCOUNTER — Ambulatory Visit
Admission: RE | Admit: 2011-12-19 | Discharge: 2011-12-19 | Disposition: A | Discharge status: Home / Self Care | Payer: PRIVATE HEALTH INSURANCE | Source: Ambulatory Visit | Attending: Specialist | Admitting: Specialist

## 2011-12-19 DIAGNOSIS — Z96659 Presence of unspecified artificial knee joint: Secondary | ICD-10-CM

## 2012-02-13 ENCOUNTER — Encounter (HOSPITAL_BASED_OUTPATIENT_CLINIC_OR_DEPARTMENT_OTHER): Payer: Self-pay | Admitting: Emergency Medicine

## 2012-02-13 ENCOUNTER — Emergency Department (HOSPITAL_BASED_OUTPATIENT_CLINIC_OR_DEPARTMENT_OTHER)
Admission: EM | Admit: 2012-02-13 | Discharge: 2012-02-13 | Disposition: A | Discharge status: Home / Self Care | Payer: PRIVATE HEALTH INSURANCE | Attending: Emergency Medicine | Admitting: Emergency Medicine

## 2012-02-13 DIAGNOSIS — J45909 Unspecified asthma, uncomplicated: Secondary | ICD-10-CM | POA: Insufficient documentation

## 2012-02-13 DIAGNOSIS — D649 Anemia, unspecified: Secondary | ICD-10-CM | POA: Insufficient documentation

## 2012-02-13 DIAGNOSIS — G4730 Sleep apnea, unspecified: Secondary | ICD-10-CM | POA: Insufficient documentation

## 2012-02-13 DIAGNOSIS — R110 Nausea: Secondary | ICD-10-CM | POA: Insufficient documentation

## 2012-02-13 DIAGNOSIS — R109 Unspecified abdominal pain: Secondary | ICD-10-CM | POA: Insufficient documentation

## 2012-02-13 DIAGNOSIS — I10 Essential (primary) hypertension: Secondary | ICD-10-CM | POA: Insufficient documentation

## 2012-02-13 DIAGNOSIS — K219 Gastro-esophageal reflux disease without esophagitis: Secondary | ICD-10-CM | POA: Insufficient documentation

## 2012-02-13 HISTORY — DX: Complex regional pain syndrome i of unspecified lower limb: G90.529

## 2012-02-13 LAB — COMPREHENSIVE METABOLIC PANEL
ALT: 12 U/L (ref 0–35)
AST: 13 U/L (ref 0–37)
Albumin: 3.9 g/dL (ref 3.5–5.2)
Alkaline Phosphatase: 111 U/L (ref 39–117)
Chloride: 101 mEq/L (ref 96–112)
Potassium: 3.6 mEq/L (ref 3.5–5.1)
Sodium: 138 mEq/L (ref 135–145)
Total Bilirubin: 0.4 mg/dL (ref 0.3–1.2)
Total Protein: 8.8 g/dL — ABNORMAL HIGH (ref 6.0–8.3)

## 2012-02-13 LAB — CBC WITH DIFFERENTIAL/PLATELET
Basophils Absolute: 0 10*3/uL (ref 0.0–0.1)
Basophils Relative: 0 % (ref 0–1)
Hemoglobin: 11.3 g/dL — ABNORMAL LOW (ref 12.0–15.0)
MCHC: 33.5 g/dL (ref 30.0–36.0)
Monocytes Relative: 5 % (ref 3–12)
Neutro Abs: 3.5 10*3/uL (ref 1.7–7.7)
Neutrophils Relative %: 65 % (ref 43–77)
Platelets: 268 10*3/uL (ref 150–400)
RDW: 13.6 % (ref 11.5–15.5)

## 2012-02-13 LAB — URINALYSIS, ROUTINE W REFLEX MICROSCOPIC
Ketones, ur: NEGATIVE mg/dL
Leukocytes, UA: NEGATIVE
Nitrite: NEGATIVE
Urobilinogen, UA: 0.2 mg/dL (ref 0.0–1.0)
pH: 5.5 (ref 5.0–8.0)

## 2012-02-13 LAB — URINE MICROSCOPIC-ADD ON

## 2012-02-13 MED ORDER — PROMETHAZINE HCL 25 MG PO TABS: 25.0000 mg | ORAL_TABLET | Freq: Four times a day (QID) | ORAL | Status: AC | PRN

## 2012-02-13 MED ORDER — ONDANSETRON 8 MG PO TBDP
ORAL_TABLET | ORAL | Status: AC
  Filled 2012-02-13: qty 1

## 2012-02-13 MED ORDER — PROMETHAZINE HCL 25 MG PO TABS: 25.0000 mg | ORAL_TABLET | Freq: Four times a day (QID) | ORAL | Status: DC | PRN

## 2012-02-13 MED ORDER — ONDANSETRON 8 MG PO TBDP: 8.0000 mg | ORAL_TABLET | Freq: Three times a day (TID) | ORAL | Status: DC | PRN

## 2012-02-13 MED ORDER — PROMETHAZINE HCL 25 MG PO TABS
25.0000 mg | ORAL_TABLET | Freq: Once | ORAL | Status: AC
  Administered 2012-02-13: 25 mg via ORAL
  Filled 2012-02-13: qty 1

## 2012-02-13 MED ORDER — ONDANSETRON 8 MG PO TBDP: 8.0000 mg | ORAL_TABLET | Freq: Once | ORAL | Status: DC

## 2012-02-13 NOTE — ED Notes (Signed)
Pt relates she is having abdominal pain  for over 2 weeks.  Has seen PCP but and nausea started today.  Denies dysuria, no vomiting, no fever.

## 2012-02-17 NOTE — ED Provider Notes (Signed)
History     CSN: [id number]  Arrival date & time 02/13/12  8413   First MD Initiated Contact with Patient 02/13/12 [phone]      Chief Complaint  Patient presents with  . Abdominal Pain  . Nausea    (Consider location/radiation/quality/duration/timing/severity/associated sxs/prior treatment) HPI Patient is a 61 yo female who presents today complaining of diffuse abdominal pain over the past 2 weeks with development of nausea today.  Patient has seen her PCP with complete lab work-up which was negative.  Patient reports pain is moderate in severity and aching.  Denies sick contacts, vomiting, diarrhea, and constipation.  Denies dysuria, or vaginal discharge.  Past surgical history significant for cholecystectomy, appendectomy, and TAH.  No known history of GI bleeding, liver disease, or pancreatitis.  No fevers noted.  There are no other associated or modifying factors.  Past Medical History  Diagnosis Date  . Barrett's syndrome     hx of  . Asthma   . Anemia     hx hemolytic anemia  . GERD (gastroesophageal reflux disease)   . Sleep apnea     STOPBANG=4  . Hypertension   . HTN (hypertension) 11/01/2011  . 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 Asthma in adult 11/01/2011  . Barrett's esophagus with esophagitis 11/01/2011    Patient states no evidence of barrett's from last surveillance EGD on 11/2010  . Stress bladder incontinence, female 11/01/2011  . 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 Hx of cholecystectomy 11/01/2011    04/2010  . Complex regional pain syndrome of lower limb     Past Surgical History  Procedure Date  . Cholecystectomy nov 2011  . Appendectomy as teenager  . Tonsillectomy as teenager  . Abdominal hysterectomy 1999    complete  . Both knees arthroscopic sugery last done 2007   . Total knee arthroplasty 10/29/2011    Procedure: TOTAL KNEE ARTHROPLASTY;  Surgeon: Eugenia Mcalpine, MD;  Location: [facility];  Service: Orthopedics;  Laterality:  Right;    No family history on file.  History  Substance Use Topics  . Smoking status: Never Smoker   . Smokeless tobacco: Never Used  . Alcohol Use: No    OB History    Grav Para Term Preterm Abortions TAB SAB Ect Mult Living                  Review of Systems  Constitutional: Negative.   HENT: Negative.   Eyes: Negative.   Respiratory: Negative.   Cardiovascular: Negative.   Gastrointestinal: Positive for nausea and abdominal pain.  Genitourinary: Negative.   Musculoskeletal: Negative.   Skin: Negative.   Neurological: Negative.   Hematological: Negative.   Psychiatric/Behavioral: Negative.   All other systems reviewed and are negative.    Allergies  Vicodin and Zofran  Home Medications   Current Outpatient Rx  Name Route Sig Dispense Refill  . GABAPENTIN 100 MG PO CAPS Oral Take 100 mg by mouth 3 (three) times daily.    . OXYCODONE HCL 5 MG PO TABS Oral Take 5 mg by mouth every 6 (six) hours as needed.    . ALBUTEROL SULFATE (2.5 MG/3ML) 0.083% IN NEBU Nebulization Take 2.5 mg by nebulization every evening. For wheezing    . AMLODIPINE BESYLATE 5 MG PO TABS Oral Take 5 mg by mouth daily with breakfast.     . OMEGA-3 FATTY ACIDS 1000 MG PO CAPS Oral Take 2 g by mouth  daily.     Marland Kitchen PANTOPRAZOLE SODIUM 40 MG PO TBEC Oral Take 40 mg by mouth daily with breakfast.     . PROMETHAZINE HCL 25 MG PO TABS Oral Take 1 tablet (25 mg total) by mouth every 6 (six) hours as needed for nausea. 30 tablet 0    BP 141/95  Pulse 94  Temp 97.8 F (36.6 C) (Oral)  Resp 18  SpO2 99%  Physical Exam  Nursing note and vitals reviewed. GEN: Well-developed, well-nourished female in no distress HEENT: Atraumatic, normocephalic. Oropharynx clear without erythema EYES: PERRLA BL, no scleral icterus. NECK: Trachea midline, no meningismus CV: regular rate and rhythm. No murmurs, rubs, or gallops PULM: No respiratory distress.  No crackles, wheezes, or rales. GI: soft, non-tender.  No guarding, rebound, or tenderness. + bowel sounds  GU: deferred Neuro: cranial nerves grossly 2-12 intact, no abnormalities of strength or sensation, A and O x 3 MSK: Patient moves all 4 extremities symmetrically, no deformity, edema, or injury noted Skin: No rashes petechiae, purpura, or jaundice Psych: no abnormality of mood   ED Course  Procedures (including critical care time)  Labs Reviewed  URINALYSIS, ROUTINE W REFLEX MICROSCOPIC - Abnormal; Notable for the following:    Hgb urine dipstick TRACE (*)     All other components within normal limits  CBC WITH DIFFERENTIAL - Abnormal; Notable for the following:    Hemoglobin 11.3 (*)     HCT 33.7 (*)     All other components within normal limits  COMPREHENSIVE METABOLIC PANEL - Abnormal; Notable for the following:    Glucose, Bld 114 (*)     Total Protein 8.8 (*)     All other components within normal limits  URINE MICROSCOPIC-ADD ON - Abnormal; Notable for the following:    Squamous Epithelial / LPF FEW (*)     Bacteria, UA FEW (*)     All other components within normal limits  LIPASE, BLOOD   No results found.   1. Abdominal pain   2. Nausea       MDM  Patient was evaluated by myself and complained of nausea and diffuse abdominal pain.  Patient had had symptoms for a solid 2 weeks with no resolution and recent completely negative lab w/u by PCP.  Exam was completely benign and patient was HDS.  We discussed that I felt that work-up was unlikely to be productive but that I would be happy to perform repeat labs.  Patient inquired about imaging and I notified her that I felt these were unlikely to be useful in the setting of her presentation unless a lab abnormality were discovered.  Patient preferred to have labs performed.  These were unremarkable with no leukocytosis, UTI, liver dysfunction, pancreatic dysfunction, renal dysfunction, or electrolyte abnormality noted.  Patient had nothing to suggest atypical ACS  presentation. Patient was able to tolerate po and never vomited here.  Patient stay was prolonged due to other higher acuity patient in the department and I apologized for the delay in her care. Patient was told to follow-up with her PCP and was given a prescription for promethazine.          Cyndra Numbers, MD 02/17/12 1148

## 2012-07-21 ENCOUNTER — Encounter (HOSPITAL_COMMUNITY)
Admission: EM | Disposition: A | Discharge status: Home / Self Care | Payer: Self-pay | Source: Home / Self Care | Attending: Cardiovascular Disease

## 2012-07-21 ENCOUNTER — Emergency Department (HOSPITAL_COMMUNITY): Payer: PRIVATE HEALTH INSURANCE

## 2012-07-21 ENCOUNTER — Ambulatory Visit (HOSPITAL_COMMUNITY): Admit: 2012-07-21 | Payer: Self-pay | Admitting: Cardiology

## 2012-07-21 ENCOUNTER — Encounter (HOSPITAL_COMMUNITY): Payer: Self-pay | Admitting: *Deleted

## 2012-07-21 ENCOUNTER — Inpatient Hospital Stay (HOSPITAL_COMMUNITY)
Admission: EM | Admit: 2012-07-21 | Discharge: 2012-07-27 | DRG: 246 | Disposition: A | Discharge status: Home / Self Care | Payer: PRIVATE HEALTH INSURANCE | Attending: Cardiovascular Disease | Admitting: Cardiovascular Disease

## 2012-07-21 DIAGNOSIS — I2109 ST elevation (STEMI) myocardial infarction involving other coronary artery of anterior wall: Secondary | ICD-10-CM

## 2012-07-21 DIAGNOSIS — I469 Cardiac arrest, cause unspecified: Secondary | ICD-10-CM

## 2012-07-21 DIAGNOSIS — G5620 Lesion of ulnar nerve, unspecified upper limb: Secondary | ICD-10-CM

## 2012-07-21 DIAGNOSIS — I213 ST elevation (STEMI) myocardial infarction of unspecified site: Secondary | ICD-10-CM

## 2012-07-21 DIAGNOSIS — Z885 Allergy status to narcotic agent status: Secondary | ICD-10-CM

## 2012-07-21 DIAGNOSIS — E785 Hyperlipidemia, unspecified: Secondary | ICD-10-CM | POA: Diagnosis present

## 2012-07-21 DIAGNOSIS — I4901 Ventricular fibrillation: Secondary | ICD-10-CM | POA: Diagnosis present

## 2012-07-21 DIAGNOSIS — I219 Acute myocardial infarction, unspecified: Secondary | ICD-10-CM

## 2012-07-21 DIAGNOSIS — K219 Gastro-esophageal reflux disease without esophagitis: Secondary | ICD-10-CM | POA: Diagnosis present

## 2012-07-21 DIAGNOSIS — I5021 Acute systolic (congestive) heart failure: Secondary | ICD-10-CM

## 2012-07-21 DIAGNOSIS — M1710 Unilateral primary osteoarthritis, unspecified knee: Secondary | ICD-10-CM | POA: Diagnosis present

## 2012-07-21 DIAGNOSIS — Z9089 Acquired absence of other organs: Secondary | ICD-10-CM

## 2012-07-21 DIAGNOSIS — Z955 Presence of coronary angioplasty implant and graft: Secondary | ICD-10-CM

## 2012-07-21 DIAGNOSIS — I959 Hypotension, unspecified: Secondary | ICD-10-CM | POA: Diagnosis present

## 2012-07-21 DIAGNOSIS — Z90710 Acquired absence of both cervix and uterus: Secondary | ICD-10-CM

## 2012-07-21 DIAGNOSIS — K2270 Barrett's esophagus without dysplasia: Secondary | ICD-10-CM

## 2012-07-21 DIAGNOSIS — I2582 Chronic total occlusion of coronary artery: Secondary | ICD-10-CM | POA: Diagnosis present

## 2012-07-21 DIAGNOSIS — I2510 Atherosclerotic heart disease of native coronary artery without angina pectoris: Secondary | ICD-10-CM | POA: Diagnosis present

## 2012-07-21 DIAGNOSIS — R946 Abnormal results of thyroid function studies: Secondary | ICD-10-CM | POA: Diagnosis present

## 2012-07-21 DIAGNOSIS — M79609 Pain in unspecified limb: Secondary | ICD-10-CM | POA: Diagnosis present

## 2012-07-21 DIAGNOSIS — G894 Chronic pain syndrome: Secondary | ICD-10-CM | POA: Diagnosis present

## 2012-07-21 DIAGNOSIS — Z888 Allergy status to other drugs, medicaments and biological substances status: Secondary | ICD-10-CM

## 2012-07-21 DIAGNOSIS — I10 Essential (primary) hypertension: Secondary | ICD-10-CM

## 2012-07-21 DIAGNOSIS — N390 Urinary tract infection, site not specified: Secondary | ICD-10-CM | POA: Diagnosis not present

## 2012-07-21 DIAGNOSIS — J9600 Acute respiratory failure, unspecified whether with hypoxia or hypercapnia: Secondary | ICD-10-CM

## 2012-07-21 DIAGNOSIS — I509 Heart failure, unspecified: Secondary | ICD-10-CM | POA: Diagnosis present

## 2012-07-21 DIAGNOSIS — J9601 Acute respiratory failure with hypoxia: Secondary | ICD-10-CM

## 2012-07-21 DIAGNOSIS — G4730 Sleep apnea, unspecified: Secondary | ICD-10-CM | POA: Diagnosis present

## 2012-07-21 DIAGNOSIS — J45909 Unspecified asthma, uncomplicated: Secondary | ICD-10-CM

## 2012-07-21 DIAGNOSIS — Z6841 Body Mass Index (BMI) 40.0 and over, adult: Secondary | ICD-10-CM

## 2012-07-21 DIAGNOSIS — E669 Obesity, unspecified: Secondary | ICD-10-CM | POA: Diagnosis present

## 2012-07-21 DIAGNOSIS — B964 Proteus (mirabilis) (morganii) as the cause of diseases classified elsewhere: Secondary | ICD-10-CM | POA: Diagnosis not present

## 2012-07-21 DIAGNOSIS — R079 Chest pain, unspecified: Secondary | ICD-10-CM | POA: Diagnosis present

## 2012-07-21 DIAGNOSIS — E876 Hypokalemia: Secondary | ICD-10-CM | POA: Diagnosis not present

## 2012-07-21 HISTORY — DX: Unspecified systolic (congestive) heart failure: I50.20

## 2012-07-21 HISTORY — DX: Cardiac arrest, cause unspecified: I46.9

## 2012-07-21 HISTORY — PX: CORONARY ANGIOPLASTY WITH STENT PLACEMENT: SHX49

## 2012-07-21 HISTORY — PX: LEFT HEART CATHETERIZATION WITH CORONARY ANGIOGRAM: SHX5451

## 2012-07-21 HISTORY — DX: Hyperlipidemia, unspecified: E78.5

## 2012-07-21 HISTORY — DX: Acute myocardial infarction, unspecified: I21.9

## 2012-07-21 HISTORY — DX: Atherosclerotic heart disease of native coronary artery without angina pectoris: I25.10

## 2012-07-21 LAB — BLOOD GAS, ARTERIAL
Drawn by: 29017
MECHVT: 540 mL
PEEP: 5 cmH2O
RATE: 14 resp/min
pCO2 arterial: 40 mmHg (ref 35.0–45.0)
pH, Arterial: 7.376 (ref 7.350–7.450)

## 2012-07-21 LAB — POCT I-STAT, CHEM 8
BUN: 11 mg/dL (ref 6–23)
Calcium, Ion: 1.19 mmol/L (ref 1.13–1.30)
Chloride: 108 mEq/L (ref 96–112)

## 2012-07-21 LAB — CBC
HCT: 33.2 % — ABNORMAL LOW (ref 36.0–46.0)
MCH: 28.5 pg (ref 26.0–34.0)
MCV: 84.5 fL (ref 78.0–100.0)
MCV: 84.2 fL (ref 78.0–100.0)
Platelets: 296 10*3/uL (ref 150–400)
Platelets: 213 10*3/uL (ref 150–400)
RBC: 3.93 MIL/uL (ref 3.87–5.11)
RDW: 13.8 % (ref 11.5–15.5)
WBC: 9.1 10*3/uL (ref 4.0–10.5)

## 2012-07-21 LAB — PROTIME-INR
INR: 0.94 (ref 0.00–1.49)
Prothrombin Time: 12.5 seconds (ref 11.6–15.2)

## 2012-07-21 LAB — COMPREHENSIVE METABOLIC PANEL
AST: 15 U/L (ref 0–37)
Albumin: 3.8 g/dL (ref 3.5–5.2)
Alkaline Phosphatase: 94 U/L (ref 39–117)
BUN: 12 mg/dL (ref 6–23)
CO2: 24 mEq/L (ref 19–32)
Chloride: 100 mEq/L (ref 96–112)
Creatinine, Ser: 0.66 mg/dL (ref 0.50–1.10)
GFR calc non Af Amer: >90 mL/min (ref >90)
Potassium: 2.8 mEq/L — ABNORMAL LOW (ref 3.5–5.1)
Total Bilirubin: 0.4 mg/dL (ref 0.3–1.2)

## 2012-07-21 LAB — POCT I-STAT TROPONIN I: Troponin i, poc: 0.06 ng/mL (ref 0.00–0.08)

## 2012-07-21 LAB — LIPID PANEL
HDL: 64 mg/dL (ref >39)
LDL Cholesterol: 134 mg/dL — ABNORMAL HIGH (ref 0–99)
Triglycerides: 142 mg/dL (ref <150)
VLDL: 28 mg/dL (ref 0–40)

## 2012-07-21 LAB — APTT: aPTT: 25 seconds (ref 24–37)

## 2012-07-21 LAB — POCT ACTIVATED CLOTTING TIME: Activated Clotting Time: 350 seconds

## 2012-07-21 LAB — MRSA PCR SCREENING: MRSA by PCR: NEGATIVE

## 2012-07-21 SURGERY — LEFT HEART CATHETERIZATION WITH CORONARY ANGIOGRAM
Anesthesia: LOCAL

## 2012-07-21 MED ORDER — POTASSIUM CHLORIDE 10 MEQ/100ML IV SOLN
INTRAVENOUS | Status: AC
  Filled 2012-07-21: qty 100

## 2012-07-21 MED ORDER — SODIUM CHLORIDE 0.9 % IJ SOLN
3.0000 mL | Freq: Two times a day (BID) | INTRAMUSCULAR | Status: DC
  Administered 2012-07-22 – 2012-07-27 (×11): 3 mL via INTRAVENOUS

## 2012-07-21 MED ORDER — VERAPAMIL HCL 2.5 MG/ML IV SOLN
INTRAVENOUS | Status: AC
  Filled 2012-07-21: qty 2

## 2012-07-21 MED ORDER — ALUM & MAG HYDROXIDE-SIMETH 200-200-20 MG/5ML PO SUSP
30.0000 mL | ORAL | Status: DC | PRN
  Administered 2012-07-21 – 2012-07-27 (×5): 30 mL via ORAL
  Filled 2012-07-21 (×6): qty 30

## 2012-07-21 MED ORDER — POTASSIUM CHLORIDE 20 MEQ/15ML (10%) PO LIQD
80.0000 meq | Freq: Once | ORAL | Status: DC
  Filled 2012-07-21: qty 60

## 2012-07-21 MED ORDER — HEPARIN (PORCINE) IN NACL 2-0.9 UNIT/ML-% IJ SOLN
INTRAMUSCULAR | Status: AC
  Filled 2012-07-21: qty 1000

## 2012-07-21 MED ORDER — SODIUM CHLORIDE 0.9 % IV SOLN: 250.0000 mL | INTRAVENOUS | Status: DC | PRN

## 2012-07-21 MED ORDER — MORPHINE SULFATE 4 MG/ML IJ SOLN
4.0000 mg | Freq: Once | INTRAMUSCULAR | Status: AC
  Administered 2012-07-21: 4 mg via INTRAVENOUS
  Filled 2012-07-21: qty 1

## 2012-07-21 MED ORDER — BIVALIRUDIN 250 MG IV SOLR
INTRAVENOUS | Status: AC
  Filled 2012-07-21: qty 250

## 2012-07-21 MED ORDER — ASPIRIN 81 MG PO CHEW
324.0000 mg | CHEWABLE_TABLET | Freq: Once | ORAL | Status: AC
  Administered 2012-07-21: 324 mg via ORAL
  Filled 2012-07-21: qty 4

## 2012-07-21 MED ORDER — ETOMIDATE 2 MG/ML IV SOLN
INTRAVENOUS | Status: AC | PRN
  Administered 2012-07-21: 20 mg via INTRAVENOUS

## 2012-07-21 MED ORDER — HEPARIN SOD (PORCINE) IN D5W 100 UNIT/ML IV SOLN
INTRAVENOUS | Status: AC
  Filled 2012-07-21: qty 250

## 2012-07-21 MED ORDER — SODIUM CHLORIDE 0.9 % IV SOLN
1000.0000 mL | INTRAVENOUS | Status: DC
  Administered 2012-07-21 – 2012-07-22 (×2): 1000 mL via INTRAVENOUS

## 2012-07-21 MED ORDER — SODIUM CHLORIDE 0.9 % IJ SOLN: 3.0000 mL | INTRAMUSCULAR | Status: DC | PRN

## 2012-07-21 MED ORDER — PRASUGREL HCL 10 MG PO TABS
ORAL_TABLET | ORAL | Status: AC
  Filled 2012-07-21: qty 6

## 2012-07-21 MED ORDER — DEXTROSE 5 % IV SOLN
150.0000 mg | INTRAVENOUS | Status: AC | PRN
  Administered 2012-07-21: 150 mg via INTRAVENOUS

## 2012-07-21 MED ORDER — NITROGLYCERIN 0.4 MG SL SUBL
0.4000 mg | SUBLINGUAL_TABLET | SUBLINGUAL | Status: DC | PRN
  Administered 2012-07-21: 0.4 mg via SUBLINGUAL
  Filled 2012-07-21 (×2): qty 25

## 2012-07-21 MED ORDER — MORPHINE SULFATE 2 MG/ML IJ SOLN
INTRAMUSCULAR | Status: AC
  Filled 2012-07-21: qty 1

## 2012-07-21 MED ORDER — POTASSIUM CHLORIDE 20 MEQ/15ML (10%) PO LIQD
ORAL | Status: AC
  Administered 2012-07-21: 80 meq
  Filled 2012-07-21: qty 60

## 2012-07-21 MED ORDER — MORPHINE SULFATE 2 MG/ML IJ SOLN
2.0000 mg | Freq: Once | INTRAMUSCULAR | Status: AC
  Administered 2012-07-21: 2 mg via INTRAVENOUS

## 2012-07-21 MED ORDER — PROMETHAZINE HCL 25 MG PO TABS: 25.0000 mg | ORAL_TABLET | Freq: Three times a day (TID) | ORAL | Status: DC | PRN

## 2012-07-21 MED ORDER — MORPHINE SULFATE 4 MG/ML IJ SOLN
INTRAMUSCULAR | Status: AC
  Filled 2012-07-21: qty 1

## 2012-07-21 MED ORDER — LIDOCAINE HCL (PF) 1 % IJ SOLN
INTRAMUSCULAR | Status: AC
  Filled 2012-07-21: qty 30

## 2012-07-21 MED ORDER — PHENOL 1.4 % MT LIQD
1.0000 | OROMUCOSAL | Status: DC | PRN
  Administered 2012-07-22: 1 via OROMUCOSAL
  Filled 2012-07-21: qty 177

## 2012-07-21 MED ORDER — PROPOFOL 10 MG/ML IV EMUL: 5.0000 ug/kg/min | INTRAVENOUS | Status: DC

## 2012-07-21 MED ORDER — MORPHINE SULFATE 2 MG/ML IJ SOLN
2.0000 mg | INTRAMUSCULAR | Status: DC | PRN
  Administered 2012-07-21 – 2012-07-26 (×3): 2 mg via INTRAVENOUS
  Filled 2012-07-21 (×3): qty 1

## 2012-07-21 MED ORDER — MORPHINE SULFATE 2 MG/ML IJ SOLN
2.0000 mg | Freq: Once | INTRAMUSCULAR | Status: DC
  Filled 2012-07-21: qty 1

## 2012-07-21 MED ORDER — POTASSIUM CHLORIDE 10 MEQ/100ML IV SOLN
10.0000 meq | INTRAVENOUS | Status: AC
  Administered 2012-07-21 (×3): 10 meq via INTRAVENOUS
  Filled 2012-07-21 (×2): qty 100

## 2012-07-21 MED ORDER — METOPROLOL TARTRATE 1 MG/ML IV SOLN
5.0000 mg | Freq: Once | INTRAVENOUS | Status: AC
  Administered 2012-07-21: 5 mg via INTRAVENOUS

## 2012-07-21 MED ORDER — ASPIRIN 81 MG PO CHEW
81.0000 mg | CHEWABLE_TABLET | Freq: Every day | ORAL | Status: DC
  Administered 2012-07-21 – 2012-07-27 (×7): 81 mg via ORAL
  Filled 2012-07-21 (×7): qty 1

## 2012-07-21 MED ORDER — MIDAZOLAM HCL 2 MG/2ML IJ SOLN
INTRAMUSCULAR | Status: AC
  Filled 2012-07-21: qty 2

## 2012-07-21 MED ORDER — NITROGLYCERIN IN D5W 200-5 MCG/ML-% IV SOLN: 2.0000 ug/min | Freq: Once | INTRAVENOUS | Status: DC

## 2012-07-21 MED ORDER — ALBUTEROL SULFATE HFA 108 (90 BASE) MCG/ACT IN AERS
6.0000 | INHALATION_SPRAY | RESPIRATORY_TRACT | Status: DC | PRN
  Filled 2012-07-21: qty 6.7

## 2012-07-21 MED ORDER — MORPHINE SULFATE 4 MG/ML IJ SOLN
4.0000 mg | Freq: Once | INTRAMUSCULAR | Status: AC
  Administered 2012-07-21: 4 mg via INTRAVENOUS

## 2012-07-21 MED ORDER — METOPROLOL TARTRATE 1 MG/ML IV SOLN
INTRAVENOUS | Status: AC
  Administered 2012-07-21: 5 mg via INTRAVENOUS
  Filled 2012-07-21: qty 5

## 2012-07-21 MED ORDER — METOCLOPRAMIDE HCL 5 MG/ML IJ SOLN
5.0000 mg | Freq: Once | INTRAMUSCULAR | Status: DC
  Filled 2012-07-21: qty 1

## 2012-07-21 MED ORDER — HEPARIN SODIUM (PORCINE) 5000 UNIT/ML IJ SOLN
4000.0000 [IU] | Freq: Once | INTRAMUSCULAR | Status: AC
  Administered 2012-07-21: 4000 [IU] via INTRAVENOUS

## 2012-07-21 MED ORDER — ONDANSETRON HCL 4 MG/2ML IJ SOLN
INTRAMUSCULAR | Status: AC
  Filled 2012-07-21: qty 2

## 2012-07-21 MED ORDER — NITROGLYCERIN IN D5W 200-5 MCG/ML-% IV SOLN
INTRAVENOUS | Status: AC
  Filled 2012-07-21: qty 250

## 2012-07-21 MED ORDER — SODIUM CHLORIDE 0.9 % IV SOLN
1.0000 mg/kg/h | INTRAVENOUS | Status: DC
  Filled 2012-07-21: qty 250

## 2012-07-21 MED ORDER — PANTOPRAZOLE SODIUM 40 MG IV SOLR
40.0000 mg | Freq: Two times a day (BID) | INTRAVENOUS | Status: DC
  Administered 2012-07-21 – 2012-07-22 (×3): 40 mg via INTRAVENOUS
  Filled 2012-07-21 (×6): qty 40

## 2012-07-21 MED ORDER — SUCCINYLCHOLINE CHLORIDE 20 MG/ML IJ SOLN
INTRAMUSCULAR | Status: AC | PRN
  Administered 2012-07-21: 150 mg via INTRAVENOUS
  Administered 2012-07-21: 50 mg via INTRAVENOUS

## 2012-07-21 MED ORDER — HEPARIN SODIUM (PORCINE) 5000 UNIT/ML IJ SOLN
5000.0000 [IU] | Freq: Three times a day (TID) | INTRAMUSCULAR | Status: DC
  Filled 2012-07-21 (×4): qty 1

## 2012-07-21 MED ORDER — PROPOFOL 10 MG/ML IV EMUL
5.0000 ug/kg/min | Freq: Once | INTRAVENOUS | Status: AC
  Administered 2012-07-21: 45 ug/kg/min via INTRAVENOUS
  Filled 2012-07-21 (×3): qty 100

## 2012-07-21 MED ORDER — ASPIRIN 81 MG PO CHEW
324.0000 mg | CHEWABLE_TABLET | ORAL | Status: AC
  Administered 2012-07-21: 324 mg via ORAL

## 2012-07-21 MED ORDER — SODIUM CHLORIDE 0.9 % IJ SOLN: 3.0000 mL | Freq: Two times a day (BID) | INTRAMUSCULAR | Status: DC

## 2012-07-21 MED ORDER — PROMETHAZINE HCL 25 MG/ML IJ SOLN
25.0000 mg | Freq: Three times a day (TID) | INTRAMUSCULAR | Status: DC | PRN
  Administered 2012-07-21 (×2): 25 mg via INTRAVENOUS
  Filled 2012-07-21 (×3): qty 1

## 2012-07-21 MED ORDER — LISINOPRIL 5 MG PO TABS
5.0000 mg | ORAL_TABLET | Freq: Every day | ORAL | Status: DC
  Administered 2012-07-22 – 2012-07-26 (×5): 5 mg via ORAL
  Filled 2012-07-21 (×7): qty 1

## 2012-07-21 MED ORDER — SODIUM CHLORIDE 0.9 % IV SOLN
1.0000 mL/kg/h | INTRAVENOUS | Status: AC
  Administered 2012-07-21: 1 mL/kg/h via INTRAVENOUS

## 2012-07-21 MED ORDER — ATORVASTATIN CALCIUM 80 MG PO TABS
80.0000 mg | ORAL_TABLET | Freq: Every day | ORAL | Status: DC
  Administered 2012-07-21 – 2012-07-26 (×5): 80 mg via ORAL
  Filled 2012-07-21 (×7): qty 1

## 2012-07-21 MED ORDER — LOPERAMIDE HCL 2 MG PO CAPS: 2.0000 mg | ORAL_CAPSULE | ORAL | Status: DC | PRN

## 2012-07-21 MED ORDER — NITROGLYCERIN 1 MG/10 ML FOR IR/CATH LAB
INTRA_ARTERIAL | Status: AC
  Filled 2012-07-21: qty 10

## 2012-07-21 MED ORDER — CARVEDILOL 3.125 MG PO TABS
3.1250 mg | ORAL_TABLET | Freq: Two times a day (BID) | ORAL | Status: DC
  Administered 2012-07-22: 6.25 mg via ORAL
  Filled 2012-07-21 (×4): qty 1

## 2012-07-21 MED ORDER — GUAIFENESIN-DM 100-10 MG/5ML PO SYRP
15.0000 mL | ORAL_SOLUTION | ORAL | Status: DC | PRN
  Administered 2012-07-23 – 2012-07-24 (×4): 15 mL via ORAL
  Filled 2012-07-21: qty 15
  Filled 2012-07-21: qty 5
  Filled 2012-07-21 (×5): qty 15

## 2012-07-21 MED ORDER — AMIODARONE HCL IN DEXTROSE 360-4.14 MG/200ML-% IV SOLN
INTRAVENOUS | Status: AC
  Filled 2012-07-21: qty 200

## 2012-07-21 MED ORDER — PROPOFOL 10 MG/ML IV BOLUS
INTRAVENOUS | Status: AC | PRN
  Administered 2012-07-21: 50 mg via INTRAVENOUS

## 2012-07-21 MED ORDER — METOCLOPRAMIDE HCL 5 MG/5ML PO SOLN
5.0000 mg | Freq: Once | ORAL | Status: DC
  Filled 2012-07-21: qty 5

## 2012-07-21 MED ORDER — PRASUGREL HCL 10 MG PO TABS
60.0000 mg | ORAL_TABLET | Freq: Once | ORAL | Status: AC
  Administered 2012-07-21: 60 mg via ORAL

## 2012-07-21 MED ORDER — PRASUGREL HCL 10 MG PO TABS
10.0000 mg | ORAL_TABLET | Freq: Every day | ORAL | Status: DC
  Administered 2012-07-22 – 2012-07-27 (×6): 10 mg via ORAL
  Filled 2012-07-21 (×6): qty 1

## 2012-07-21 MED ORDER — SODIUM CHLORIDE 0.9 % IV SOLN: INTRAVENOUS | Status: DC

## 2012-07-21 MED ORDER — HEPARIN (PORCINE) IN NACL 100-0.45 UNIT/ML-% IJ SOLN
1000.0000 [IU]/h | INTRAMUSCULAR | Status: DC
  Administered 2012-07-21: 1000 [IU]/h via INTRAVENOUS
  Filled 2012-07-21 (×2): qty 250

## 2012-07-21 MED ORDER — ALUM & MAG HYDROXIDE-SIMETH 200-200-20 MG/5ML PO SUSP: 30.0000 mL | Freq: Once | ORAL | Status: DC

## 2012-07-21 MED ORDER — HEPARIN (PORCINE) IN NACL 100-0.45 UNIT/ML-% IJ SOLN
900.0000 [IU]/h | INTRAMUSCULAR | Status: DC
  Administered 2012-07-21: 900 [IU]/h via INTRAVENOUS
  Filled 2012-07-21: qty 250

## 2012-07-21 MED ORDER — PRAMOXINE-ZINC OXIDE IN MO 1-12.5 % RE OINT
1.0000 "application " | TOPICAL_OINTMENT | Freq: Three times a day (TID) | RECTAL | Status: DC | PRN
  Filled 2012-07-21: qty 28.3

## 2012-07-21 MED ORDER — NITROGLYCERIN IN D5W 200-5 MCG/ML-% IV SOLN
2.0000 ug/min | INTRAVENOUS | Status: DC
  Administered 2012-07-21: 10 ug/min via INTRAVENOUS
  Filled 2012-07-21: qty 250

## 2012-07-21 MED ORDER — ASPIRIN 300 MG RE SUPP: 300.0000 mg | RECTAL | Status: AC

## 2012-07-21 MED ORDER — MAGNESIUM HYDROXIDE 400 MG/5ML PO SUSP: 30.0000 mL | Freq: Every day | ORAL | Status: DC | PRN

## 2012-07-21 MED FILL — Medication: Qty: 1 | Status: AC

## 2012-07-21 NOTE — Code Documentation (Signed)
OG placed ?

## 2012-07-21 NOTE — Progress Notes (Signed)
Utilization Review Completed.   Alaylah Heatherington, RN, BSN Nurse Case Manager  [phone]  

## 2012-07-21 NOTE — ED Provider Notes (Signed)
History     CSN: [id number]  Arrival date & time 07/21/12  0257   First MD Initiated Contact with Patient 07/21/12 0310      Chief Complaint  Patient presents with  . Chest Pain    (Consider location/radiation/quality/duration/timing/severity/associated sxs/prior treatment) HPI Comments: Comes to the ER with complaints of chest pain. She reports that she has been having intermittent pain in her chest the last 2 or 3 days, but in the last hour she got acutely worse. Patient now experiencing severe crushing pain in the center of her chest with nausea and diaphoresis.  Patient is a 62 y.o. female presenting with chest pain.  Chest Pain Associated symptoms: diaphoresis, nausea and shortness of breath     Past Medical History  Diagnosis Date  . Barrett's syndrome     hx of  . Asthma   . Anemia     hx hemolytic anemia  . GERD (gastroesophageal reflux disease)   . Sleep apnea     STOPBANG=4  . Hypertension   . HTN (hypertension) 11/01/2011  . 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 Asthma in adult 11/01/2011  . Barrett's esophagus with esophagitis 11/01/2011    Patient states no evidence of barrett's from last surveillance EGD on 11/2010  . Stress bladder incontinence, female 11/01/2011  . 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 Hx of cholecystectomy 11/01/2011    04/2010  . Complex regional pain syndrome of lower limb     Past Surgical History  Procedure Laterality Date  . Cholecystectomy  nov 2011  . Appendectomy  as teenager  . Tonsillectomy  as teenager  . Abdominal hysterectomy  1999    complete  . Both knees arthroscopic sugery last done 2007    . Total knee arthroplasty  10/29/2011    Procedure: TOTAL KNEE ARTHROPLASTY;  Surgeon: Eugenia Mcalpine, MD;  Location: [facility];  Service: Orthopedics;  Laterality: Right;    History reviewed. No pertinent family history.  History  Substance Use Topics  . Smoking status: Never Smoker   . Smokeless  tobacco: Never Used  . Alcohol Use: No    OB History   Grav Para Term Preterm Abortions TAB SAB Ect Mult Living                  Review of Systems  Constitutional: Positive for diaphoresis.  Respiratory: Positive for shortness of breath.   Cardiovascular: Positive for chest pain.  Gastrointestinal: Positive for nausea.  All other systems reviewed and are negative.    Allergies  Dilaudid; Vicodin; and Zofran  Home Medications   Current Outpatient Rx  Name  Route  Sig  Dispense  Refill  . albuterol (PROVENTIL) (2.5 MG/3ML) 0.083% nebulizer solution   Nebulization   Take 2.5 mg by nebulization every evening. For wheezing         . amLODipine (NORVASC) 5 MG tablet   Oral   Take 5 mg by mouth daily with breakfast.          . fish oil-omega-3 fatty acids 1000 MG capsule   Oral   Take 2 g by mouth daily.          Marland Kitchen gabapentin (NEURONTIN) 100 MG capsule   Oral   Take 100 mg by mouth 3 (three) times daily.         Marland Kitchen oxyCODONE (OXY IR/ROXICODONE) 5 MG immediate release tablet   Oral   Take 5 mg by  mouth every 6 (six) hours as needed.         . pantoprazole (PROTONIX) 40 MG tablet   Oral   Take 40 mg by mouth daily with breakfast.            BP 128/116  Pulse 90  Temp(Src) 97.6 F (36.4 C) (Oral)  Ht 5\' 2"  (1.575 m)  Wt 215 lb (97.523 kg)  BMI 39.31 kg/m2  SpO2 99%  Physical Exam  Constitutional: She is oriented to person, place, and time. She appears well-developed and well-nourished. She appears distressed.  HENT:  Head: Normocephalic and atraumatic.  Right Ear: Hearing normal.  Nose: Nose normal.  Mouth/Throat: Oropharynx is clear and moist and mucous membranes are normal.  Eyes: Conjunctivae and EOM are normal. Pupils are equal, round, and reactive to light.  Neck: Normal range of motion. Neck supple.  Cardiovascular: Normal rate, regular rhythm, S1 normal and S2 normal.  Exam reveals no gallop and no friction rub.   No murmur  heard. Pulmonary/Chest: Effort normal and breath sounds normal. No respiratory distress. She exhibits no tenderness.  Abdominal: Soft. Normal appearance and bowel sounds are normal. There is no hepatosplenomegaly. There is no tenderness. There is no rebound, no guarding, no tenderness at McBurney's point and negative Murphy's sign. No hernia.  Musculoskeletal: Normal range of motion.  Neurological: She is alert and oriented to person, place, and time. She has normal strength. No cranial nerve deficit or sensory deficit. Coordination normal. GCS eye subscore is 4. GCS verbal subscore is 5. GCS motor subscore is 6.  Skin: Skin is warm and intact. No rash noted. She is diaphoretic. No cyanosis.  Psychiatric: She has a normal mood and affect. Her speech is normal and behavior is normal. Thought content normal.    ED Course  Procedures (including critical care time)  INTUBATION Performed by: Jaci Carrel J.  Required items: required blood products, implants, devices, and special equipment available Patient identity confirmed: provided demographic data and hospital-assigned identification number Time out: Immediately prior to procedure a "time out" was called to verify the correct patient, procedure, equipment, support staff and site/side marked as required.  Indications: VTACH arrest  Intubation method: Direct Laryngoscopy - difficult visualization, the arytenoids visualized but the vocal cords were not visualized   Preoxygenation: BVM  Sedatives: 20 mg Etomidate Paralytic: 150mg  Succinylcholine  Tube Size: 7.5 cuffed  Post-procedure assessment: chest rise and ETCO2 monitor Breath sounds: equal and absent over the epigastrium Tube secured with: ETT holder  Narrative: Despite initial ET CO2 monitor color change and perceived bilateral breath sounds, patient desaturated approximately 5 minutes after intubation he became difficult to bag. The ET tube was removed and the patient was  ventilated by bag valve mask. Patient was administered a propofol bolus and 50 mg of succinylcholine and then reintubated using glide scope laryngoscopy. Endotracheal tube was witnessed to pass through the vocal cords on the video monitor. ET CO2 monitor color changes positive, breath sounds were equal bilaterally.  Chest x-ray interpreted by radiologist and me.  Chest x-ray findings: endotracheal tube in appropriate position     EKG  Date: 07/21/2012  Rate: 94    Rhythm: normal sinus rhythm  QRS Axis: left  Intervals: normal  ST/T Wave abnormalities: ST elevations anteriorly  Conduction Disutrbances:Acute Anterior MI  Narrative Interpretation:   Old EKG Reviewed: changes noted    Labs Reviewed  CBC - Abnormal; Notable for the following:    Hemoglobin 11.3 (*)    HCT  33.2 (*)    All other components within normal limits  COMPREHENSIVE METABOLIC PANEL - Abnormal; Notable for the following:    Potassium 2.8 (*)    Glucose, Bld 161 (*)    Total Protein 8.7 (*)    All other components within normal limits  PRO B NATRIURETIC PEPTIDE  PROTIME-INR  APTT  POCT I-STAT TROPONIN I   Dg Chest Portable 1 View  07/21/2012  *RADIOLOGY REPORT*  Clinical Data: Endotracheal tube placement.  PORTABLE CHEST - 1 VIEW  Comparison: Chest radiograph performed earlier today at 03:35 a.m.  Findings: The patient's endotracheal tube is seen ending 3-4 cm above the carina.  An enteric tube is seen extending below the diaphragm.  The lungs remain hypoexpanded.  Vascular crowding and vascular congestion are seen.  Mildly increased interstitial markings raise question for mild interstitial edema. No pleural effusion or pneumothorax is seen.  The cardiomediastinal silhouette is borderline in size.  No acute osseous abnormalities are identified.  IMPRESSION:  1.  Endotracheal tube seen ending 3-4 cm above the carina. 2.  Lungs hypoexpanded; vascular congestion, with mildly increased interstitial markings,  raising question for mild interstitial edema.   Original Report Authenticated By: Tonia Ghent, M.D.    Dg Chest Portable 1 View  07/21/2012  *RADIOLOGY REPORT*  Clinical Data: Chest pain.  PORTABLE CHEST - 1 VIEW  Comparison: Chest radiograph performed 11/01/2011, and CTA of the chest performed 11/03/2011  Findings: The lungs are hypoexpanded.  Vascular crowding and vascular congestion are seen.  Bibasilar airspace opacities raise question for mild interstitial edema.  No definite pleural effusion or pneumothorax is seen.  The cardiomediastinal silhouette is borderline normal in size.  No acute osseous abnormalities are seen.  IMPRESSION: Lungs hypoexpanded.  Vascular congestion noted, with bibasilar airspace opacities, raising question for mild interstitial edema.   Original Report Authenticated By: Tonia Ghent, M.D.      Diagnosis: 1. Acute anterior ST elevation MI 2. V. tach/V. fib arrest    MDM  Patient presented to the ER with complaints of chest pain. She had been experiencing intermittent chest pain for 2 or 3 days, but now one hour of continuous and worsening chest pain. Patient was in distress at arrival. Initial EKG was not classic but was suggestive of anterior ST elevation MI. Case was discussed with Dr. Excell Seltzer, on call for interventional cardiology. He reviewed the EKG tracing and agreed to initiate ST elevation MI protocol. While patient was being prepared to be transferred to [facility] cath lab, pain acutely worsened, patient became more acutely distressed and it was noted she was having PVCs on the monitor. Patient quickly deteriorated into V. tach. This was noted immediately and CPR immediately started. Patient was defibrillated within 1-2 minutes of the start of V. tach and CPR. Patient was found to be in PEA with a slow wide-complex after defibrillation. She was administered epinephrine and CPR was continued. Arrangements were made to intubate the patient. Patient went into V.  tach/V. fib 2 more times and was shocked 2 more times, after which time she was in sinus rhythm with pulses and blood pressure above 100 systolic. She regained pulses and blood pressure prior to being able to intubate the patient, but it was felt that she should be intubated to facilitate continued workup. Patient was administered etomidate 20 mg and succinylcholine 150 mg and intubated. She initially did well, but then became difficult to bag and saturations dropped. The ET tube was removed and the patient was  bagged back to 100% oxygenation. The patient was reintubated, this time using a GlideScope. After intubation, patient remained stable and was transported to [facility] for cardiac catheterization.  Patient initiated on amiodarone drip.  CRITICAL CARE Performed by: Gilda Crease   Total critical care time:  Critical care time was exclusive of separately billable procedures and treating other patients.  Critical care was necessary to treat or prevent imminent or life-threatening deterioration.  Critical care was time spent personally by me on the following activities: development of treatment plan with patient and/or surrogate as well as nursing, discussions with consultants, evaluation of patient's response to treatment, examination of patient, obtaining history from patient or surrogate, ordering and performing treatments and interventions, ordering and review of laboratory studies, ordering and review of radiographic studies, pulse oximetry and re-evaluation of patient's condition.       Gilda Crease, MD 07/21/12 [phone]

## 2012-07-21 NOTE — Code Documentation (Signed)
Portable Chest X-Ray completed.

## 2012-07-21 NOTE — Code Documentation (Signed)
Patient seen by cardiology at bedside, transported by [facility] to cath lab

## 2012-07-21 NOTE — Procedures (Signed)
Extubation Procedure Note  Patient Details:   Name: Wendy Reid DOB: Apr 15, 1951 MRN: [id number]   Airway Documentation:     Evaluation  O2 sats: stable throughout Complications: No apparent complications Patient did tolerate procedure well. Bilateral Breath Sounds: Diminished Suctioning: Airway;Oral Yes Pt extubated to a 4lpm [place], Sp02 100% on 4lpm, pt able to vocallize.  Melanee Spry 07/21/2012, 10:01 AM

## 2012-07-21 NOTE — Progress Notes (Signed)
Patient sleeping comfortably   Cath findings noted. Nursing reports when awake complains of pain.  ? Due to CPR  ? Pericarditis (early)  No rub on exam. Would continue current regimen  Get echo to evaluate.

## 2012-07-21 NOTE — CV Procedure (Signed)
   Cardiac Catheterization Procedure Note  Name: Wendy Reid MRN: [id number] DOB: 23-Jun-1950  Procedure:  Selective Coronary Angiography  Indication: 62 yo BF who presented last night with an anterior STEMI. She was treated emergently with DES of the proximal LAD. Today she complained of severe chest pain. Ecg showed serial changes of anterior MI. Limited cardiac cath performed to rule out acute stent thrombosis.   Procedural details: Attempted right radial access without success. The right groin was prepped, draped, and anesthetized with 1% lidocaine. Using modified Seldinger technique, a 5 French sheath was introduced into the right femoral artery. Standard left Judkins catheter was used for coronary angiography and left ventriculography. Catheter exchanges were performed over a guidewire. There were no immediate procedural complications. The patient was transferred to the post catheterization recovery area for further monitoring.  Procedural Findings: Hemodynamics:  AO123/87 mean 104 mm Hg    Coronary angiography: Coronary dominance: right  Left mainstem:Normal  Left anterior descending (LAD): The proximal LAD stent is widely patent with TIMI 3 flow.  Ramus intermediate is small with 70% disease.  Left circumflex (LCx): normal.  Right coronary artery (RCA): Not performed.   Final Conclusions:  Continued patency of stent in the proximal LAD.  Recommendations: continue medical management.  Theron Arista [facility] 07/21/2012, 4:14 PM

## 2012-07-21 NOTE — H&P (Signed)
PULMONARY  / CRITICAL CARE MEDICINE  Name: Wendy Reid MRN: [id number] DOB: September 20, 1950    ADMISSION DATE:  07/21/2012 CONSULTATION DATE:    Cindi Carbon MD :  [facility]  CHIEF COMPLAINT:  STEMI, Cardiac arrest  BRIEF PATIENT DESCRIPTION: 62 y/o female with HTN and asthma was admitted from the [facility] ED on 2/14 with a STEMI which lead to a cardiac arrest.  She underwent CPR for two minutes and was intubated.  PCCM asked to admi  SIGNIFICANT EVENTS / STUDIES:  2/14 LHC >>  LINES / TUBES: 2/14 ETT >>  CULTURES:   ANTIBIOTICS:   HISTORY OF PRESENT ILLNESS:   62 y/o female with HTN and asthma was admitted from the [facility] ED on 2/14 with a STEMI which lead to a cardiac arrest.  She underwent CPR for two minutes and was intubated.  PCCM asked to see for vent management.   No further history could be obtained because the patient was intubated.  In the cath lab the patient was making purposeful movements requiring sedation.     PAST MEDICAL HISTORY :  Past Medical History  Diagnosis Date  . Barrett's syndrome     hx of  . Asthma   . Anemia     hx hemolytic anemia  . GERD (gastroesophageal reflux disease)   . Sleep apnea     STOPBANG=4  . Hypertension   . HTN (hypertension) 11/01/2011  . 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 Asthma in adult 11/01/2011  . Barrett's esophagus with esophagitis 11/01/2011    Patient states no evidence of barrett's from last surveillance EGD on 11/2010  . Stress bladder incontinence, female 11/01/2011  . 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 Hx of cholecystectomy 11/01/2011    04/2010  . Complex regional pain syndrome of lower limb    Past Surgical History  Procedure Laterality Date  . Cholecystectomy  nov 2011  . Appendectomy  as teenager  . Tonsillectomy  as teenager  . Abdominal hysterectomy  1999    complete  . Both knees arthroscopic sugery last done 2007    . Total knee arthroplasty  10/29/2011     Procedure: TOTAL KNEE ARTHROPLASTY;  Surgeon: Eugenia Mcalpine, MD;  Location: [facility] ORS;  Service: Orthopedics;  Laterality: Right;   Prior to Admission medications   Medication Sig Start Date End Date Taking? Authorizing Provider  albuterol (PROVENTIL) (2.5 MG/3ML) 0.083% nebulizer solution Take 2.5 mg by nebulization every evening. For wheezing    Historical Provider, MD  amLODipine (NORVASC) 5 MG tablet Take 5 mg by mouth daily with breakfast.     Historical Provider, MD  fish oil-omega-3 fatty acids 1000 MG capsule Take 2 g by mouth daily.     Historical Provider, MD  gabapentin (NEURONTIN) 100 MG capsule Take 100 mg by mouth 3 (three) times daily.    Historical Provider, MD  oxyCODONE (OXY IR/ROXICODONE) 5 MG immediate release tablet Take 5 mg by mouth every 6 (six) hours as needed.    Historical Provider, MD  pantoprazole (PROTONIX) 40 MG tablet Take 40 mg by mouth daily with breakfast.     Historical Provider, MD   Allergies  Allergen Reactions  . Dilaudid (Hydromorphone Hcl) Nausea And Vomiting  . Vicodin (Hydrocodone-Acetaminophen) Nausea And Vomiting  . Zofran Nausea And Vomiting    FAMILY HISTORY:  History reviewed. No pertinent family history. SOCIAL HISTORY:  reports that she has never smoked. She has  never used smokeless tobacco. She reports that she does not drink alcohol or use illicit drugs.  REVIEW OF SYSTEMS:  Cannot obtain due to intubation  SUBJECTIVE:   VITAL SIGNS: Temp:  [97.6 F (36.4 C)] 97.6 F (36.4 C) (02/14 0301) Pulse Rate:  [90-115] 107 (02/14 0415) Resp:  [16-30] 16 (02/14 0415) BP: (75-133)/(30-116) 86/55 mmHg (02/14 0415) SpO2:  [90 %-100 %] 99 % (02/14 0415) FiO2 (%):  [100 %] 100 % (02/14 0410) Weight:  [97.523 kg (215 lb)] 97.523 kg (215 lb) (02/14 0305) HEMODYNAMICS:   VENTILATOR SETTINGS: Vent Mode:  [-] PRVC FiO2 (%):  [100 %] 100 % Set Rate:  [12 bmp] 12 bmp Vt Set:  [530 mL] 530 mL PEEP:  [5 cmH20] 5 cmH20 INTAKE /  OUTPUT: Intake/Output   None     PHYSICAL EXAMINATION:  General:  Sedated on vent Neuro:  Sedated, currently not following commands HEENT:  PERRL, NCAT, ETT in place Cardiovascular:  RRR, no mgr Lungs:  CTA B Abdomen:  BS+, soft, nontender Musculoskeletal:  Normal bulk and tone Skin:  No breakdown  LABS:  Recent Labs Lab 07/21/12 0314 07/21/12 0330  HGB 11.3*  --   WBC 9.1  --   PLT 296  --   NA 137  --   K 2.8*  --   CL 100  --   CO2 24  --   GLUCOSE 161*  --   BUN 12  --   CREATININE 0.66  --   CALCIUM 9.9  --   AST 15  --   ALT 15  --   ALKPHOS 94  --   BILITOT 0.4  --   PROT 8.7*  --   ALBUMIN 3.8  --   APTT  --  25  INR  --  0.94  PROBNP <5.0  --    No results found for this basename: GLUCAP,  in the last 168 hours  2/14 CXR: ETT in place, vascular congestion  ASSESSMENT / PLAN:  PULMONARY A:  Acute respiratory failure in setting of cardiac arrest Asthma P:   -ABG now -vent support until after completion of LHC, then perform SBT/WUA later 2/14 -prn bronchodilators  CARDIOVASCULAR A:  STEMI with cardiac arrest HTN P:  -LHC now -cardiac meds per cardiology  RENAL A:  No acute issues P:   -monitor UOP  GASTROINTESTINAL A: No acute issues History of GERD, Barrett's esophagus P:   -PPI for SUP  HEMATOLOGIC A:  No signs of bleeding P:  -monitor CBC  INFECTIOUS A:  No acute issues P:     ENDOCRINE A:  No acute issues P:     NEUROLOGIC A:  Vent synchrony P:   -propofol, fentanyl gtt titrated to RASS 0  TODAY'S SUMMARY:   I have personally obtained a history, examined the patient, evaluated laboratory and imaging results, formulated the assessment and plan and placed orders. CRITICAL CARE: The patient is critically ill with multiple organ systems failure and requires high complexity decision making for assessment and support, frequent evaluation and titration of therapies, application of advanced monitoring technologies  and extensive interpretation of multiple databases. Critical Care Time devoted to patient care services described in this note is 45 minutes.   Fonnie Jarvis Pulmonary and Critical Care Medicine [facility] Pager: [phone]  07/21/2012, 5:07 AM

## 2012-07-21 NOTE — H&P (View-Only) (Signed)
Patient Name: Wendy Reid Date of Encounter: 07/21/2012   Principal Problem:   STEMI (ST elevation myocardial infarction) Active Problems:   Anemia   HTN (hypertension)   GERD (gastroesophageal reflux disease)   Barrett's esophagus with esophagitis   Acute systolic congestive heart failure   Acute respiratory failure    SUBJECTIVE  Patient just returned from cath lab.  CURRENT MEDS . amiodarone (NEXTERONE PREMIX) 360 mg/200 mL dextrose      . aspirin  81 mg Oral Daily  . heparin  5,000 Units Subcutaneous Q8H  . morphine      . nitroGLYCERIN  2-200 mcg/min Intravenous Once  . pantoprazole (PROTONIX) IV  40 mg Intravenous Q12H  . potassium chloride  10 mEq Intravenous Q1 Hr x 6  . [START ON 07/22/2012] prasugrel  10 mg Oral Daily  . sodium chloride  3 mL Intravenous Q12H    OBJECTIVE  Filed Vitals:   07/21/12 0405 07/21/12 0410 07/21/12 0415 07/21/12 0444  BP: 106/70 75/30 86/55    Pulse: 113 104 107 83  Temp:      TempSrc:      Resp: 17 21 16    Height:      Weight:      SpO2: 99% 98% 99%    No intake or output data in the 24 hours ending 07/21/12 0652 Filed Weights   07/21/12 0305  Weight: 215 lb (97.523 kg)    PHYSICAL EXAM Neuro: sedated HEENT:  intubated  Neck: Supple without bruits or JVD. Lungs:  Coarse b/l breath sounds Heart: RRR no s3, s4, or murmurs. Abdomen: Soft, non-tender, non-distended, BS + x 4.  Extremities: No clubbing, cyanosis or edema. DP/PT/Radials 2+ and equal bilaterally.  Accessory Clinical Findings  CBC  Recent Labs  07/21/12 0314  WBC 9.1  HGB 11.3*  HCT 33.2*  MCV 84.5  PLT 296   Basic Metabolic Panel  Recent Labs  07/21/12 0314  NA 137  K 2.8*  CL 100  CO2 24  GLUCOSE 161*  BUN 12  CREATININE 0.66  CALCIUM 9.9   Liver Function Tests  Recent Labs  07/21/12 0314  AST 15  ALT 15  ALKPHOS 94  BILITOT 0.4  PROT 8.7*  ALBUMIN 3.8    TELE - sinus rhythm  Radiology/Studies Dg Chest  Portable 1 View 07/21/2012 IMPRESSION:  1.  Endotracheal tube seen ending 3-4 cm above the carina. 2.  Lungs hypoexpanded; vascular congestion, with mildly increased interstitial markings, raising question for mild interstitial edema.   Original Report Authenticated By: Tonia Ghent, M.D.    ASSESSMENT AND PLAN 62 yo F with h/o HTN, obesity, barrett's esophagus & asthma admitted from [facility] on 07/21/12 with STEMI s/p defibrillation x3 & CPR x2 minutes.  #STEMI with PEA/VF cardiac arrest: Patient is s/p cath this AM which revealed total occlusion of the proximal LAD treated with PCI by DES. Cath also suggested severe segmental LV systolic dysfunction (EF 30%). Residual disease includes 30-40% stenosis of LCx. -Continues to be on Bivalirudin this morning -Load with Effient, continue ASA -Start beta blocker once patient extubated - low dose coreg 3.125 BID with hold parameters -Check lipid profile, add atorvastatin 80 qHS -Cardiac rehab when extubated and stable  #Acute systolic Heart failure: secondary to STEMI (no prior echo in system). EF 30%. -Start ACEI once Cr monitored for 24-48h (given recent contrast exposure) -Pt will need repeat echo in 4-6 weeks  #Hypokalemia: unclear etiology, being repleted, add on Magnesium & follow (patient on  chronic PPI therapy at home)  #Acute Respiratory Failure - secondary to cardiac arrest, vent mgmt per PCCM - plan to extubated today  #HTN: home regimen includes amlodipine, currently soft pressures on sedation  #Code Status: full    Signed, Stacy Gardner MD, PGY 2 Pager [phone]  Patient seen and examined with resident earlier this AM  Intubated at time.  Now extubated  Is complaining of 9/10 CP  Similar to discomfort yesterday. On exam, patient appears anxious.  Lungs:  CTA  Cardiac  RRR.  No S3 or S4.  Ext:  No edema  Warm. EKG has evolved since early AM  R waves no longer present in lateral percordial leads.  There is ST elevation (coved) with T  wave inversion.  THis may represent evolution of EKG in post MI setting  But pain is concerning.   ON review of L heart cath with Dr [place], flow though the LAD was slow after vessel was open.    PLan:  Maximize medical Rx with NTG IV, IV lopressor.  Restart heparin.  NO bolus.  Continue ASA and Effient WIll plan repeat cath to reeval LAD.  Patient understands.   Dietrich Pates 2:29 PM

## 2012-07-21 NOTE — Code Documentation (Signed)
Temp. Foley placed.

## 2012-07-21 NOTE — Progress Notes (Signed)
ANTICOAGULATION CONSULT NOTE - Initial Consult  Pharmacy Consult for heparin Indication: chest pain/ACS  Allergies  Allergen Reactions  . Dilaudid (Hydromorphone Hcl) Nausea And Vomiting  . Vicodin (Hydrocodone-Acetaminophen) Nausea And Vomiting  . Zofran Nausea And Vomiting    Patient Measurements: Height: 5\' 2"  (157.5 cm) Weight: 215 lb (97.523 kg) IBW/kg (Calculated) : 50.1 Heparin Dosing Weight: 73.1kg  Vital Signs: Temp: 97.5 F (36.4 C) (02/14 1153) Temp src: Oral (02/14 1153) BP: 132/96 mmHg (02/14 1300) Pulse Rate: 76 (02/14 1300)  Labs:  Recent Labs  07/21/12 0314 07/21/12 0330 07/21/12 0700  HGB 11.3*  --  10.1*  HCT 33.2*  --  29.8*  PLT 296  --  213  APTT  --  25  --   LABPROT  --  12.5  --   INR  --  0.94  --   CREATININE 0.66  --  0.66    Estimated Creatinine Clearance: 81.6 ml/min (by C-G formula based on Cr of 0.66).   Medical History: Past Medical History  Diagnosis Date  . Barrett's syndrome     hx of  . Asthma   . Anemia     hx hemolytic anemia  . GERD (gastroesophageal reflux disease)   . Sleep apnea     STOPBANG=4  . Hypertension   . HTN (hypertension) 11/01/2011  . 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 Asthma in adult 11/01/2011  . Barrett's esophagus with esophagitis 11/01/2011    Patient states no evidence of barrett's from last surveillance EGD on 11/2010  . Stress bladder incontinence, female 11/01/2011  . 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 Hx of cholecystectomy 11/01/2011    04/2010  . Complex regional pain syndrome of lower limb    Assessment: Wendy Reid with recurrent chest pain after STEMI and cardiac arrest yesterday. Had a DES placed in LAD on 2/13 and is currently on aspirin and Effient. Now is going back to cath and is to not be rebolused with heparin prior to that. CBC is stable this morning, no bleeding noted.  Goal of Therapy:  Heparin level 0.3-0.7 units/ml Monitor platelets by  anticoagulation protocol: Yes   Plan:  1. Start heparin drip at 900 units/hr 2. Follow up after cath 3. Daily CBC/Heparin level if therapy to continue  Alizea Pell D. Stanislawa Gaffin, PharmD Clinical Pharmacist Pager: [phone] 07/21/2012 2:30 PM

## 2012-07-21 NOTE — Progress Notes (Signed)
 Patient Name: Wendy Reid Date of Encounter: 07/21/2012   Principal Problem:   STEMI (ST elevation myocardial infarction) Active Problems:   Anemia   HTN (hypertension)   GERD (gastroesophageal reflux disease)   Barrett's esophagus with esophagitis   Acute systolic congestive heart failure   Acute respiratory failure    SUBJECTIVE  Patient just returned from cath lab.  CURRENT MEDS . amiodarone (NEXTERONE PREMIX) 360 mg/200 mL dextrose      . aspirin  81 mg Oral Daily  . heparin  5,000 Units Subcutaneous Q8H  . morphine      . nitroGLYCERIN  2-200 mcg/min Intravenous Once  . pantoprazole (PROTONIX) IV  40 mg Intravenous Q12H  . potassium chloride  10 mEq Intravenous Q1 Hr x 6  . [START ON 07/22/2012] prasugrel  10 mg Oral Daily  . sodium chloride  3 mL Intravenous Q12H    OBJECTIVE  Filed Vitals:   07/21/12 0405 07/21/12 0410 07/21/12 0415 07/21/12 0444  BP: 106/70 75/30 86/55   Pulse: 113 104 107 83  Temp:      TempSrc:      Resp: 17 21 16   Height:      Weight:      SpO2: 99% 98% 99%    No intake or output data in the 24 hours ending 07/21/12 0652 Filed Weights   07/21/12 0305  Weight: 215 lb (97.523 kg)    PHYSICAL EXAM Neuro: sedated HEENT:  intubated  Neck: Supple without bruits or JVD. Lungs:  Coarse b/l breath sounds Heart: RRR no s3, s4, or murmurs. Abdomen: Soft, non-tender, non-distended, BS + x 4.  Extremities: No clubbing, cyanosis or edema. DP/PT/Radials 2+ and equal bilaterally.  Accessory Clinical Findings  CBC  Recent Labs  07/21/12 0314  WBC 9.1  HGB 11.3*  HCT 33.2*  MCV 84.5  PLT 296   Basic Metabolic Panel  Recent Labs  07/21/12 0314  NA 137  K 2.8*  CL 100  CO2 24  GLUCOSE 161*  BUN 12  CREATININE 0.66  CALCIUM 9.9   Liver Function Tests  Recent Labs  07/21/12 0314  AST 15  ALT 15  ALKPHOS 94  BILITOT 0.4  PROT 8.7*  ALBUMIN 3.8    TELE - sinus rhythm  Radiology/Studies Dg Chest  Portable 1 View 07/21/2012 IMPRESSION:  1.  Endotracheal tube seen ending 3-4 cm above the carina. 2.  Lungs hypoexpanded; vascular congestion, with mildly increased interstitial markings, raising question for mild interstitial edema.   Original Report Authenticated By: Jeffrey Chang, M.D.    ASSESSMENT AND PLAN 62 yo F with h/o HTN, obesity, barrett's esophagus & asthma admitted from [facility] on 07/21/12 with STEMI s/p defibrillation x3 & CPR x2 minutes.  #STEMI with PEA/VF cardiac arrest: Patient is s/p cath this AM which revealed total occlusion of the proximal LAD treated with PCI by DES. Cath also suggested severe segmental LV systolic dysfunction (EF 30%). Residual disease includes 30-40% stenosis of LCx. -Continues to be on Bivalirudin this morning -Load with Effient, continue ASA -Start beta blocker once patient extubated - low dose coreg 3.125 BID with hold parameters -Check lipid profile, add atorvastatin 80 qHS -Cardiac rehab when extubated and stable  #Acute systolic Heart failure: secondary to STEMI (no prior echo in system). EF 30%. -Start ACEI once Cr monitored for 24-48h (given recent contrast exposure) -Pt will need repeat echo in 4-6 weeks  #Hypokalemia: unclear etiology, being repleted, add on Magnesium & follow (patient on   chronic PPI therapy at home)  #Acute Respiratory Failure - secondary to cardiac arrest, vent mgmt per PCCM - plan to extubated today  #HTN: home regimen includes amlodipine, currently soft pressures on sedation  #Code Status: full    Signed, SHARDA, NEEMA MD, PGY 2 Pager [phone]  Patient seen and examined with resident earlier this AM  Intubated at time.  Now extubated  Is complaining of 9/10 CP  Similar to discomfort yesterday. On exam, patient appears anxious.  Lungs:  CTA  Cardiac  RRR.  No S3 or S4.  Ext:  No edema  Warm. EKG has evolved since early AM  R waves no longer present in lateral percordial leads.  There is ST elevation (coved) with T  wave inversion.  THis may represent evolution of EKG in post MI setting  But pain is concerning.   ON review of L heart cath with Dr Jordan, flow though the LAD was slow after vessel was open.    PLan:  Maximize medical Rx with NTG IV, IV lopressor.  Restart heparin.  NO bolus.  Continue ASA and Effient WIll plan repeat cath to reeval LAD.  Patient understands.   Wendy Reid 2:29 PM  

## 2012-07-21 NOTE — CV Procedure (Signed)
Cardiac Catheterization Procedure Note  Name: Wendy Reid MRN: [id number] DOB: June 26, 1950  Procedure: Left Heart Cath, Selective Coronary Angiography, LV angiography,  PTCA/Stent of the LAD, Perclose of the RFA  Indication: Anterior MI complicated by VF arrest.  62 year old woman presented with an acute anterior wall MI. She sustained a VF cardiac arrest. She required defibrillation x3 and CPR. She was brought emergently for cardiac catheterization and PCI. There was a delay in transfer because of the need to stabilize the patient, perform CPR, and obtain a stable airway.   Diagnostic Procedure Details: The right groin was prepped, draped, and anesthetized with 1% lidocaine. Using the modified Seldinger technique, a 6 French sheath was introduced into the right femoral artery. Standard Judkins catheters were used for selective coronary angiography and left ventriculography. Catheter exchanges were performed over a wire.  The diagnostic procedure was well-tolerated without immediate complications.  PROCEDURAL FINDINGS Hemodynamics: AO 99/70 LV 100/30  Coronary angiography: Coronary dominance: right  Left mainstem: Widely patent with minor luminal irregularity. The vessel bifurcates into the LAD and left circumflex.  Left anterior descending (LAD): The LAD is 100% occluded in the proximal aspect of the vessel. This has been graphic appearance of an acute occlusion.  Left circumflex (LCx): The left circumflex is tortuous. This is a large caliber vessel. There is 30-40% stenosis in the proximal vessel an area of acute angulation. There is a large obtuse marginal branch with no significant stenosis.  Right coronary artery (RCA): Widely patent, large, dominant vessel. The PDA and PLA branches are patent. There is a large acute marginal branch that is also widely patent.  Left ventriculography: Left ventricular systolic function is severely depressed. There is akinesis of the anterior  wall and apex. The estimated left ventricular ejection fraction is 30%.   PCI Procedure Note:  Following the diagnostic procedure, the decision was made to proceed with PCI.  Weight-based bivalirudin was given for anticoagulation. Once a therapeutic ACT was achieved, a 6 [place] XB LAD 3.5 cm guide catheter was inserted.  A cougar coronary guidewire was used to cross the lesion.  The lesion was predilated with a 2.5 x 15 mm balloon.  The lesion was then stented with a 3.5 x 24 mm Promus premier drug-eluting stent.  The stent was postdilated with a 3.75 x 20 mm noncompliant balloon.  Following PCI, there was 0% residual stenosis and TIMI-3 flow. There was mildly reduced flow after post dilatation of the stent. However, flow remained TIMI 3 but did not fill as briskly as the circumflex. Final angiography confirmed an excellent result. Femoral hemostasis was achieved with a Perclose device.  The patient tolerated the PCI procedure well. There were no immediate procedural complications.  The patient was transferred to the post catheterization recovery area for further monitoring.  PCI Data: Vessel - LAD/Segment - proximal Percent Stenosis (pre)  100 TIMI-flow 0 Stent 3.5 x 24 mm drug-eluting Percent Stenosis (post) 0 TIMI-flow (post) 3  Final Conclusions:    1. Acute anterior wall myocardial infarction complicated by ventricular fibrillation cardiac arrest 2. Total occlusion of the proximal LAD, treated successfully with primary PCI utilizing a drug-eluting stent 3. Widely patent right coronary artery and left circumflex vessels 4. Severe segmental left ventricular systolic dysfunction  Recommendations: Post MI medical therapy with treatment in the cardiac intensive care unit. The patient will be loaded with effient through her orogastric tube. We will continue bivalirudin for at least 2 hours.  Tonny Bollman 07/21/2012, 5:58 AM

## 2012-07-21 NOTE — ED Notes (Signed)
EPD made aware of patient i stat trop lab result 0.06.

## 2012-07-21 NOTE — H&P (Signed)
Patient ID: Wendy Reid MRN: [id number] DOB/AGE: November 24, 1950 62 y.o. Admit date: 07/21/2012  Primary Care Physician:Beck, Carlene Coria, MD Primary Cardiologist: Unknown, has been seen at [facility] in the past  Active Problems:   * No active hospital problems. *  HPI: 62 year-old woman who presented to [facility] with chest pain. Her EKG was suspicious for acute anterior injury. A code STEMI was called. As the patient was being loaded on to the stretcher by EMS, she arrested with ventricular tachycardia/ventricular fibrillation. She was defibrillated 3 times and CPR was done for less than 5 minutes. She was successfully resuscitated with return of spontaneous circulation. Upon my arrival, the patient is intubated and sedated. History was obtained from her son.  The patient has no past cardiac problems. She does have a history of hypertension and obesity. She is a retired Transport planner. She has had chest pain on and off for a long time. She's undergone stress testing, most recently in 2011 or 2012. She has never had a cardiac catheterization. She's had stuttering chest pain for the past few days and developed severe substernal chest pain tonight. She apparently describes the pain as a burning sensation and had associated shortness of breath. There were no other associated symptoms. There is no other history available at this time.  Past Medical History  Diagnosis Date  . Barrett's syndrome     hx of  . Asthma   . Anemia     hx hemolytic anemia  . GERD (gastroesophageal reflux disease)   . Sleep apnea     STOPBANG=4  . Hypertension   . HTN (hypertension) 11/01/2011  . 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 Asthma in adult 11/01/2011  . Barrett's esophagus with esophagitis 11/01/2011    Patient states no evidence of barrett's from last surveillance EGD on 11/2010  . Stress bladder incontinence, female 11/01/2011  . 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 Hx of cholecystectomy 11/01/2011    04/2010  . Complex regional pain syndrome of lower limb     Past Surgical History  Procedure Laterality Date  . Cholecystectomy  nov 2011  . Appendectomy  as teenager  . Tonsillectomy  as teenager  . Abdominal hysterectomy  1999    complete  . Both knees arthroscopic sugery last done 2007    . Total knee arthroplasty  10/29/2011    Procedure: TOTAL KNEE ARTHROPLASTY;  Surgeon: Eugenia Mcalpine, MD;  Location: [facility];  Service: Orthopedics;  Laterality: Right;    History reviewed. No pertinent family history.  History   Social History  . Marital Status: Widowed    Spouse Name: N/A    Number of Children: N/A  . Years of Education: N/A   Occupational History  . Not on file.   Social History Main Topics  . Smoking status: Never Smoker   . Smokeless tobacco: Never Used  . Alcohol Use: No  . Drug Use: No  . Sexually Active:    Other Topics Concern  . Not on file   Social History Narrative  . No narrative on file     Review of systems is unobtainable as the patient is intubated and sedated  Physical Exam: Blood pressure 128/116, pulse 90, temperature 97.6 F (36.4 C), temperature source Oral, height 5\' 2"  (1.575 m), weight 97.523 kg (215 lb), SpO2 99.00%.   Pt is intubated, unresponsive. She has been sedated. She is an  obese, African American woman. HEENT: normal Neck: JVP normal. Carotid upstrokes normal without bruits. No thyromegaly. Lungs: equal expansion, clear bilaterally CV: Apex is nonpalpable, RRR without murmur or gallop Abd: soft, obese, bowel sounds are present Back: no CVA tenderness Ext: no C/C/E        DP/PT pulses intact and = Skin: warm and dry without rash  Labs:   Lab Results  Component Value Date   WBC 9.1 07/21/2012   HGB 11.3* 07/21/2012   HCT 33.2* 07/21/2012   MCV 84.5 07/21/2012   PLT 296 07/21/2012    Recent Labs Lab 07/21/12 0314  NA 137  K 2.8*  CL 100  CO2 24  BUN 12  CREATININE  0.66  CALCIUM 9.9  PROT 8.7*  BILITOT 0.4  ALKPHOS 94  ALT 15  AST 15  GLUCOSE 161*   Lab Results  Component Value Date   CKTOTAL 218* 11/02/2011   CKMB 2.3 11/02/2011   TROPONINI <0.30 11/02/2011    No results found for this basename: CHOL   No results found for this basename: HDL   No results found for this basename: LDLCALC   No results found for this basename: TRIG   No results found for this basename: CHOLHDL   No results found for this basename: LDLDIRECT      Radiology: Dg Chest Portable 1 View  07/21/2012  *RADIOLOGY REPORT*  Clinical Data: Endotracheal tube placement.  PORTABLE CHEST - 1 VIEW  Comparison: Chest radiograph performed earlier today at 03:35 a.m.  Findings: The patient's endotracheal tube is seen ending 3-4 cm above the carina.  An enteric tube is seen extending below the diaphragm.  The lungs remain hypoexpanded.  Vascular crowding and vascular congestion are seen.  Mildly increased interstitial markings raise question for mild interstitial edema. No pleural effusion or pneumothorax is seen.  The cardiomediastinal silhouette is borderline in size.  No acute osseous abnormalities are identified.  IMPRESSION:  1.  Endotracheal tube seen ending 3-4 cm above the carina. 2.  Lungs hypoexpanded; vascular congestion, with mildly increased interstitial markings, raising question for mild interstitial edema.   Original Report Authenticated By: Tonia Ghent, M.D.    Dg Chest Portable 1 View  07/21/2012  *RADIOLOGY REPORT*  Clinical Data: Chest pain.  PORTABLE CHEST - 1 VIEW  Comparison: Chest radiograph performed 11/01/2011, and CTA of the chest performed 11/03/2011  Findings: The lungs are hypoexpanded.  Vascular crowding and vascular congestion are seen.  Bibasilar airspace opacities raise question for mild interstitial edema.  No definite pleural effusion or pneumothorax is seen.  The cardiomediastinal silhouette is borderline normal in size.  No acute osseous  abnormalities are seen.  IMPRESSION: Lungs hypoexpanded.  Vascular congestion noted, with bibasilar airspace opacities, raising question for mild interstitial edema.   Original Report Authenticated By: Tonia Ghent, M.D.   EKG: Normal sinus rhythm, ST elevation probable acute anteroseptal infarction  ASSESSMENT AND PLAN:  1. Acute anterior ST elevation MI complicated by ventricular fibrillation cardiac arrest. 2. Ventilator dependent respiratory failure secondary to cardiac arrest 3. Obesity 4. Hypertension  The patient will undergo emergency cardiac catheterization and PCI. I have explained the risks, indications, and alternatives with her son. Following cardiac catheterization and PCI she will be transferred to the cardiac ICU and we will comanage her with the CCM team. The patient received aspirin prior to her cardiac arrest. Further treatment pending the result of her cardiac catheterization.  Signed: Tonny Bollman 07/21/2012, 4:32 AM

## 2012-07-21 NOTE — Progress Notes (Signed)
Chaplain paged at [phone] and was in unit at 0350. Ms Primm was in the process of being sent to [facility] when she coded and required CPR. Her son was in the hospital and the chaplain remained with him until Ms Mealor was stable enough to be transferred to [facility].   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 She complained of shortness of breathe and chest pains and come to [facility] for treatment.  Ms Abaya is a person of [employer] faith who at present does not have a community of faith. She has been visiting around for a supportive congregation. Beside her son, who is temporarily living with her, she has a daughter who attends [employer]. The daughter lives off campus and could not be contacted via her cell phone.  Prayers and grief council was provided to the pt son. The son asked this chaplain to thank the ED staff and tell them he felt supported in every way. He thanks especially the nurses who kept him informed and provided support during his mother's CPR.  The on-call chaplain at [facility] was contacted.  Benjie Karvonen. Sabree Nuon, DMin Chaplain

## 2012-07-21 NOTE — Interval H&P Note (Signed)
History and Physical Interval Note:  07/21/2012 3:22 PM  Wendy Reid  has presented today for surgery, with the diagnosis of STEMI  The various methods of treatment have been discussed with the patient and family. After consideration of risks, benefits and other options for treatment, the patient has consented to  Procedure(s): LEFT HEART CATHETERIZATION WITH CORONARY ANGIOGRAM (N/A) as a surgical intervention .  The patient's history has been reviewed, patient examined, no change in status, stable for surgery.  I have reviewed the patient's chart and labs.  Questions were answered to the patient's satisfaction.     Theron Arista [facility] 07/21/2012 3:22 PM

## 2012-07-21 NOTE — ED Notes (Signed)
Pt reports intermittent chest pain x2 days - tonight became acutely worse - pt is short of breath and diaphoretic on arrival - states pain is now severe and described as a burning sensation.

## 2012-07-21 NOTE — Progress Notes (Signed)
PULMONARY  / CRITICAL CARE MEDICINE  Name: Wendy Reid MRN: [id number] DOB: 1950/07/07    ADMISSION DATE:  07/21/2012 CONSULTATION DATE:    Cindi Carbon MD :  [facility]  CHIEF COMPLAINT:  STEMI, Cardiac arrest  BRIEF PATIENT DESCRIPTION: 62 y/o female with HTN and asthma was admitted from the [facility] ED on 2/14 with a STEMI which lead to a cardiac arrest.  She underwent CPR for two minutes and was intubated.  PCCM asked to admi  SIGNIFICANT EVENTS / STUDIES:  2/14 LHC >>  LINES / TUBES: 2/14 ETT >>  CULTURES: None  ANTIBIOTICS: None  HISTORY OF PRESENT ILLNESS:   62 y/o female with HTN and asthma was admitted from the [facility] ED on 2/14 with a STEMI which lead to a cardiac arrest.  She underwent CPR for two minutes and was intubated.  PCCM asked to see for vent management.   No further history could be obtained because the patient was intubated.  In the cath lab the patient was making purposeful movements requiring sedation.    SUBJECTIVE: Alert and interactive  VITAL SIGNS: Temp:  [96.1 F (35.6 C)-97.6 F (36.4 C)] 96.1 F (35.6 C) (02/14 0600) Pulse Rate:  [73-115] 79 (02/14 0934) Resp:  [14-30] 18 (02/14 0934) BP: (75-133)/(30-116) 118/84 mmHg (02/14 0700) SpO2:  [90 %-100 %] 100 % (02/14 0934) FiO2 (%):  [40 %-100 %] 40 % (02/14 0934) Weight:  [97.523 kg (215 lb)] 97.523 kg (215 lb) (02/14 0305) HEMODYNAMICS:   VENTILATOR SETTINGS: Vent Mode:  [-] CPAP;PSV FiO2 (%):  [40 %-100 %] 40 % Set Rate:  [12 bmp-16 bmp] 16 bmp Vt Set:  [530 mL-540 mL] 540 mL PEEP:  [5 cmH20] 5 cmH20 Pressure Support:  [5 cmH20] 5 cmH20 Plateau Pressure:  [21 cmH20-23 cmH20] 23 cmH20 INTAKE / OUTPUT: Intake/Output     02/13 0701 - 02/14 0700 02/14 0701 - 02/15 0700   IV Piggyback 100    Total Intake(mL/kg) 100 (1)    Net +100           PHYSICAL EXAMINATION:  General:  Sedated on vent but arousable and following commands Neuro:  Arousable, following command and moving all  ext to command. HEENT:  PERRL, NCAT, ETT in place Cardiovascular:  RRR, no mgr Lungs:  CTA B Abdomen:  BS+, soft, nontender Musculoskeletal:  Normal bulk and tone Skin:  No breakdown  LABS:  Recent Labs Lab 07/21/12 0314 07/21/12 0330 07/21/12 0650 07/21/12 0700  HGB 11.3*  --   --  10.1*  WBC 9.1  --   --  8.3  PLT 296  --   --  213  NA 137  --   --   --   K 2.8*  --   --   --   CL 100  --   --   --   CO2 24  --   --   --   GLUCOSE 161*  --   --   --   BUN 12  --   --   --   CREATININE 0.66  --   --  0.66  CALCIUM 9.9  --   --   --   MG  --   --   --  1.9  AST 15  --   --   --   ALT 15  --   --   --   ALKPHOS 94  --   --   --   BILITOT  0.4  --   --   --   PROT 8.7*  --   --   --   ALBUMIN 3.8  --   --   --   APTT  --  25  --   --   INR  --  0.94  --   --   PROBNP <5.0  --   --   --   PHART  --   --  7.376  --   PCO2ART  --   --  40.0  --   PO2ART  --   --  372.0*  --    No results found for this basename: GLUCAP,  in the last 168 hours  2/14 CXR: ETT in place, vascular congestion  ASSESSMENT / PLAN:  PULMONARY A:  Acute respiratory failure in setting of cardiac arrest Asthma P:   - ABG now - SBT to extubate today. - PRN bronchodilators.  CARDIOVASCULAR A:  STEMI with cardiac arrest HTN P:  - LHC per cards.  RENAL A:  No acute issues P:   - Monitor UOP  GASTROINTESTINAL A: No acute issues History of GERD, Barrett's esophagus P:   - PPI for SUP  HEMATOLOGIC A:  No signs of bleeding P:  - Monitor CBC  INFECTIOUS A:  No acute issues P:   Monitor fever curve and WBC.  ENDOCRINE A:  No acute issues P:   Monitor  NEUROLOGIC A:  Vent synchrony P:   - D/C all sedation  TODAY'S SUMMARY:   I have personally obtained a history, examined the patient, evaluated laboratory and imaging results, formulated the assessment and plan and placed orders.  CRITICAL CARE: The patient is critically ill with multiple organ systems failure and  requires high complexity decision making for assessment and support, frequent evaluation and titration of therapies, application of advanced monitoring technologies and extensive interpretation of multiple databases. Critical Care Time devoted to patient care services described in this note is 45 minutes.   Darlin Stenseth,MD Pulmonary and Critical Care Medicine [facility] Pager: [phone]  07/21/2012, 11:31 AM

## 2012-07-22 ENCOUNTER — Inpatient Hospital Stay (HOSPITAL_COMMUNITY): Payer: PRIVATE HEALTH INSURANCE

## 2012-07-22 LAB — BLOOD GAS, ARTERIAL
Bicarbonate: 24.4 mEq/L — ABNORMAL HIGH (ref 20.0–24.0)
O2 Saturation: 97 %
Patient temperature: 98.6
TCO2: 25.5 mmol/L (ref 0–100)
pH, Arterial: 7.436 (ref 7.350–7.450)

## 2012-07-22 LAB — BASIC METABOLIC PANEL
BUN: 9 mg/dL (ref 6–23)
Chloride: 100 mEq/L (ref 96–112)
Creatinine, Ser: 0.63 mg/dL (ref 0.50–1.10)
GFR calc Af Amer: >90 mL/min (ref >90)
Glucose, Bld: 118 mg/dL — ABNORMAL HIGH (ref 70–99)

## 2012-07-22 LAB — CBC
HCT: 32.2 % — ABNORMAL LOW (ref 36.0–46.0)
Hemoglobin: 10.9 g/dL — ABNORMAL LOW (ref 12.0–15.0)
MCHC: 33.9 g/dL (ref 30.0–36.0)
MCV: 84.3 fL (ref 78.0–100.0)
RDW: 14.1 % (ref 11.5–15.5)
WBC: 7.7 10*3/uL (ref 4.0–10.5)

## 2012-07-22 MED ORDER — CARVEDILOL 6.25 MG PO TABS
6.2500 mg | ORAL_TABLET | Freq: Two times a day (BID) | ORAL | Status: DC
  Administered 2012-07-22 – 2012-07-23 (×2): 6.25 mg via ORAL
  Filled 2012-07-22 (×4): qty 1

## 2012-07-22 NOTE — Progress Notes (Addendum)
Cardiac Rehab Phase I  1214  Patient has refused ambulation at this time due to pain in her knee. Pt states she just had a total knee replacement and is in a lot of pain. Does not feel she can place weight on it at this time. Offered to ambulate patient in the room and she refused that as well. Will f/u with her on Monday. Floor RN notified

## 2012-07-22 NOTE — Plan of Care (Signed)
Problem: Phase II Progression Outcomes Goal: Cardiac Rehab referral Outcome: Progressing Pt refused to amb in room w/cardiac rehab staff .Pt voiced concern for knee which she had total knee repl .recently . Goal: Wean IV nitroglycerin to PO or topical Outcome: Completed/Met Date Met:  07/22/12 NTG gtt off

## 2012-07-22 NOTE — Progress Notes (Signed)
  Echocardiogram 2D Echocardiogram has been performed.  Ellender Hose A 07/22/2012, 10:02 AM

## 2012-07-22 NOTE — Progress Notes (Signed)
Patient Name: Wendy Reid Date of Encounter: 07/22/2012   Principal Problem:   STEMI (ST elevation myocardial infarction) Active Problems:   Anemia   HTN (hypertension)   GERD (gastroesophageal reflux disease)   Barrett's esophagus with esophagitis   Acute systolic congestive heart failure   Acute respiratory failure    SUBJECTIVE  She presented with a STEMI - s/p 3.5 x 24 mm Promus premier drug-eluting stent to the proximal  LAD. The stent was postdilated with a 3.75 x 20 mm noncompliant balloon  - Patient was re-cath'ed yesterday due to continuous severe chest pain. Stent was patent. No thrombus formation. - Currently on Nitro gtt. Chest pain is 2-3/10, no SOB. - Tel: sinus, HR is about 90 to 110 /min  CURRENT MEDS . alum & mag hydroxide-simeth  30 mL Oral Once  . aspirin  81 mg Oral Daily  . atorvastatin  80 mg Oral q1800  . carvedilol  3.125 mg Oral BID WC  . lisinopril  5 mg Oral Daily  . metoCLOPramide (REGLAN) injection  5 mg Intravenous Once  .  morphine injection  2 mg Intravenous Once  . nitroGLYCERIN  2-200 mcg/min Intravenous Once  . pantoprazole (PROTONIX) IV  40 mg Intravenous Q12H  . potassium chloride  80 mEq Per Tube Once  . prasugrel  10 mg Oral Daily  . sodium chloride  3 mL Intravenous Q12H    OBJECTIVE  Filed Vitals:   07/22/12 0500 07/22/12 0600 07/22/12 0700 07/22/12 0800  BP: 138/80 123/87  116/76  Pulse: 89 92 93 95  Temp:    99.6 F (37.6 C)  TempSrc:    Oral  Resp: 17 18 21 22   Height:      Weight: 228 lb 9.9 oz (103.7 kg)     SpO2: 100% 99% 97% 97%    Intake/Output Summary (Last 24 hours) at 07/22/12 0814 Last data filed at 07/22/12 0700  Gross per 24 hour  Intake 655.05 ml  Output    460 ml  Net 195.05 ml   Filed Weights   07/21/12 0305 07/22/12 0500  Weight: 215 lb (97.523 kg) 228 lb 9.9 oz (103.7 kg)    PHYSICAL EXAM Neuro: alert, O x 3 HEENT:  nomal  Neck: Supple without bruits or JVD. Lungs:  CTAB Heart:  RRR no s3, s4, or murmurs. Abdomen: Soft, non-tender, non-distended, BS + x 4.  Extremities: No clubbing, cyanosis or edema. DP/PT/Radials 2+ and equal bilaterally.  Accessory Clinical Findings  CBC  Recent Labs  07/21/12 0700 07/22/12 0500  WBC 8.3 7.7  HGB 10.1* 10.9*  HCT 29.8* 32.2*  MCV 84.2 84.3  PLT 213 261    Recent Labs Lab 07/21/12 0314 07/21/12 0504 07/21/12 0700 07/22/12 0500  NA 137 141  --  136  K 2.8* 2.7*  --  3.9  CL 100 108  --  100  CO2 24  --   --  24  GLUCOSE 161* 264*  --  118*  BUN 12 11  --  9  CREATININE 0.66 0.60 0.66 0.63  CALCIUM 9.9  --   --  9.8  MG  --   --  1.9 2.1  PHOS  --   --   --  2.3     Liver Function Tests  Recent Labs  07/21/12 0314  AST 15  ALT 15  ALKPHOS 94  BILITOT 0.4  PROT 8.7*  ALBUMIN 3.8    TELE - sinus rhythm. HR is  about 90 to 110 /min  Radiology/Studies Dg Chest Portable 1 View 08/19/12 IMPRESSION:  1.  Endotracheal tube seen ending 3-4 cm above the carina. 2.  Lungs hypoexpanded; vascular congestion, with mildly increased interstitial markings, raising question for mild interstitial edema.   Original Report Authenticated By: Tonia Ghent, M.D.    ASSESSMENT AND PLAN 62 yo F with h/o HTN, obesity, barrett's esophagus & asthma admitted from [facility] on 2012/08/19 with STEMI s/p defibrillation x3 & CPR x2 minutes. S/p cath and DES in proximal LAD. Re-cath'ed in afternoon 2012/08/19 due to severe chest pain. Had patent stent and no thrombus formation.  #STEMI with PEA/VF cardiac arrest: Patient is s/p cath on Aug 19, 2012, which revealed total occlusion of the LAD also suggested severe segmental LV systolic dysfunction (EF 30%). Residual disease includes 30-40% stenosis of LCx.   Re-cath'ed on 08-19-12 due to severe chest pain,  with patent stent and no thrombus formation. Currently on Nitro gtt. Chest pain improved.  - continue Effient, ASA, lisinopril - continue coreg and lipitor  - Cardiac rehab  #Acute systolic  Heart failure: Secondary to STEMI (no prior echo in system). EF 30%. - continue ACEI and coreg. - will titrate up the dosages if bp tolerate. --Pt will need repeat echo in 4-6 weeks  #Acute Respiratory Failure - resolved. Extubated yesterday. No SOB.   #HTN: bp is 123/87 mmHg. -continue lisinopril  #: HLD: LDL 134. On lipitor  #Code Status: full  Signed, Lorretta Harp MD, PGY 2 Pager [phone]  Attending Note:   The patient was seen and examined.  Agree with assessment and plan as noted above.  Wendy Reid is having a better day today.  No significant CP.  Breathing well.  Had re-check cath yesterday AM that revealed a patent LAD stent.    She is very tender in his chest - likely due to CPR.    Will increase Coreg to 6.25 mg BID.  Titrate ACE-I inhibitor up tomorrow.  Will keep in ICU today.   Vesta Mixer, Montez Hageman., MD, [facility] 07/22/2012, 8:37 AM

## 2012-07-23 DIAGNOSIS — I10 Essential (primary) hypertension: Secondary | ICD-10-CM

## 2012-07-23 DIAGNOSIS — G5620 Lesion of ulnar nerve, unspecified upper limb: Secondary | ICD-10-CM

## 2012-07-23 LAB — TROPONIN I: Troponin I: >20 ng/mL (ref <0.30)

## 2012-07-23 LAB — BASIC METABOLIC PANEL
CO2: 25 mEq/L (ref 19–32)
Chloride: 102 mEq/L (ref 96–112)
Creatinine, Ser: 0.85 mg/dL (ref 0.50–1.10)
Potassium: 3.3 mEq/L — ABNORMAL LOW (ref 3.5–5.1)

## 2012-07-23 MED ORDER — CARVEDILOL 12.5 MG PO TABS
12.5000 mg | ORAL_TABLET | Freq: Two times a day (BID) | ORAL | Status: DC
  Administered 2012-07-23 – 2012-07-27 (×9): 12.5 mg via ORAL
  Filled 2012-07-23 (×12): qty 1

## 2012-07-23 MED ORDER — ACETAMINOPHEN 325 MG PO TABS
650.0000 mg | ORAL_TABLET | Freq: Four times a day (QID) | ORAL | Status: DC | PRN
  Administered 2012-07-25: 650 mg via ORAL
  Filled 2012-07-23: qty 2

## 2012-07-23 MED ORDER — POTASSIUM CHLORIDE CRYS ER 20 MEQ PO TBCR
40.0000 meq | EXTENDED_RELEASE_TABLET | Freq: Once | ORAL | Status: AC
  Administered 2012-07-23: 40 meq via ORAL
  Filled 2012-07-23: qty 2

## 2012-07-23 MED ORDER — PANTOPRAZOLE SODIUM 40 MG PO TBEC
40.0000 mg | DELAYED_RELEASE_TABLET | Freq: Two times a day (BID) | ORAL | Status: DC
  Administered 2012-07-23 – 2012-07-27 (×9): 40 mg via ORAL
  Filled 2012-07-23 (×10): qty 1

## 2012-07-23 MED ORDER — HYDROCOD POLST-CHLORPHEN POLST 10-8 MG/5ML PO LQCR
5.0000 mL | Freq: Two times a day (BID) | ORAL | Status: DC | PRN
  Administered 2012-07-23 – 2012-07-24 (×2): 5 mL via ORAL
  Filled 2012-07-23 (×3): qty 5

## 2012-07-23 NOTE — Consult Note (Addendum)
Reason for Consult: Paresthesias both hands Referring Physician: Dr Elease Hashimoto  CC: Bilateral hand numbness and tingling  HPI: Wendy Reid is a pleasant 62 y.o. female who was admitted to [facility] on 07/21/2012 in transfer from [facility] where the patient had presented with an ST elevation MI. In the process of transferring to [facility] the patient arrested and required defibrillation x3 as well as CPR x2. The patient was intubated and sedated on arrival to [facility]. She subsequently underwent PTA stenting of a totally occluded LAD lesion. She was extubated later that day on the 14th. The patient states that since being extubated she has noted numbness and tingling in her hands as well as some clumsiness with fine motor movements. The symptoms have been concerning to the patient. Initially she thought that it was due to to positioning; however, the symptoms have not progressed but they have not improved. The patient denies history of diabetes. She denies any previous similar symptoms; although, she does have a significant nerve problem in her right lower extremity as a result of knee surgery in the past. Neurology was asked to evaluate the patient.  Past Medical History  Diagnosis Date  . Barrett's syndrome     hx of  . Asthma   . Anemia     hx hemolytic anemia  . GERD (gastroesophageal reflux disease)   . Sleep apnea     STOPBANG=4  . Hypertension   . HTN (hypertension) 11/01/2011  . 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 Asthma in adult 11/01/2011  . Barrett's esophagus with esophagitis 11/01/2011    Patient states no evidence of barrett's from last surveillance EGD on 11/2010  . Stress bladder incontinence, female 11/01/2011  . 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 Hx of cholecystectomy 11/01/2011    04/2010  . Complex regional pain syndrome of lower limb     Past Surgical History  Procedure Laterality Date  . Cholecystectomy  nov 2011   . Appendectomy  as teenager  . Tonsillectomy  as teenager  . Abdominal hysterectomy  1999    complete  . Both knees arthroscopic sugery last done 2007    . Total knee arthroplasty  10/29/2011    Procedure: TOTAL KNEE ARTHROPLASTY;  Surgeon: Eugenia Mcalpine, MD;  Location: [facility];  Service: Orthopedics;  Laterality: Right;    History reviewed. No pertinent family history.  Social History:  reports that she has never smoked. She has never used smokeless tobacco. She reports that she does not drink alcohol or use illicit drugs.  Allergies  Allergen Reactions  . Dilaudid (Hydromorphone Hcl) Nausea And Vomiting  . Vicodin (Hydrocodone-Acetaminophen) Nausea And Vomiting  . Zofran Nausea And Vomiting    Medications:  Scheduled: . alum & mag hydroxide-simeth  30 mL Oral Once  . aspirin  81 mg Oral Daily  . atorvastatin  80 mg Oral q1800  . carvedilol  12.5 mg Oral BID WC  . lisinopril  5 mg Oral Daily  . nitroGLYCERIN  2-200 mcg/min Intravenous Once  . pantoprazole  40 mg Oral BID AC  . potassium chloride  80 mEq Per Tube Once  . prasugrel  10 mg Oral Daily  . sodium chloride  3 mL Intravenous Q12H    ROS:  General ROS: negative for - chills, fatigue, fever, night sweats, weight gain or weight loss Psychological ROS: negative for - behavioral disorder, hallucinations, memory difficulties, mood swings or  suicidal ideation Ophthalmic ROS: negative for - blurry vision, double vision, eye pain or loss of vision ENT ROS: negative for - epistaxis, nasal discharge, oral lesions, sore throat, tinnitus or vertigo Allergy and Immunology ROS: negative for - hives or itchy/watery eyes Hematological and Lymphatic ROS: negative for - bleeding problems, bruising or swollen lymph nodes Endocrine ROS: negative for - galactorrhea, hair pattern changes, polydipsia/polyuria or temperature intolerance Respiratory ROS: negative for - cough, hemoptysis, shortness of breath or wheezing Cardiovascular  ROS: Positive for recent chest pain and shortness of breath related to the patient's MI and congestive heart failure. Gastrointestinal ROS: negative for - abdominal pain, diarrhea, hematemesis, nausea/vomiting or stool incontinence Genito-Urinary ROS: negative for - dysuria, hematuria, incontinence or urinary frequency/urgency Musculoskeletal ROS: The patient is a history of previous knee surgery with subsequent nerve injury for which she is taking gabapentin and amitriptyline in the past. Neurological ROS: as noted in HPI Dermatological ROS: negative for rash and skin lesion changes  Physical Examination: Blood pressure 100/70, pulse 106, temperature 98.1 F (36.7 C), temperature source Oral, resp. rate 20, height 5' 1.5" (1.562 m), weight 103.057 kg (227 lb 3.2 oz), SpO2 97.00%.  Neurologic Examination Mental Status: Alert, oriented, thought content appropriate.  Speech fluent without evidence of aphasia.  Able to follow 3 step commands without difficulty. Cranial Nerves: II: Discs flat bilaterally; Visual fields grossly normal, pupils equal, round, reactive to light and accommodation III,IV, VI: ptosis not present, extra-ocular motions intact bilaterally V,VII: smile symmetric, facial light touch sensation normal bilaterally VIII: hearing normal bilaterally IX,X: gag reflex present XI: bilateral shoulder shrug XII: midline tongue extension Motor: Right : Upper extremity   5/5    Left:     Upper extremity   5/5  Lower extremity   5/5     Lower extremity   5/5 Tone and bulk:normal tone throughout; no atrophy noted Sensory: Pinprick and light touch decreased on the medial aspect of the palms and fifth digits bilaterally.  The medial aspect of the left ring finger is involved and the tip of the ring finger on the right hand.  Upper extremity sensation intact otherwise.   Deep Tendon Reflexes: 2+ in the upper extremities.   Plantars: Right: mute   Left: mute Cerebellar: normal  finger-to-nose CV: pulses palpable throughout   Laboratory Studies:   Basic Metabolic Panel:  Recent Labs Lab 07/21/12 0314 07/21/12 0504 07/21/12 0700 07/22/12 0500 07/23/12 0625 07/23/12 0830  NA 137 141  --  136 138  --   K 2.8* 2.7*  --  3.9 3.3*  --   CL 100 108  --  100 102  --   CO2 24  --   --  24 25  --   GLUCOSE 161* 264*  --  118* 111*  --   BUN 12 11  --  9 8  --   CREATININE 0.66 0.60 0.66 0.63 0.85  --   CALCIUM 9.9  --   --  9.8 9.6  --   MG  --   --  1.9 2.1  --  2.1  PHOS  --   --   --  2.3  --   --     Liver Function Tests:  Recent Labs Lab 07/21/12 0314  AST 15  ALT 15  ALKPHOS 94  BILITOT 0.4  PROT 8.7*  ALBUMIN 3.8   No results found for this basename: LIPASE, AMYLASE,  in the last 168 hours No results found for  this basename: AMMONIA,  in the last 168 hours  CBC:  Recent Labs Lab 07/21/12 0314 07/21/12 0504 07/21/12 0700 07/22/12 0500  WBC 9.1  --  8.3 7.7  HGB 11.3* 9.5* 10.1* 10.9*  HCT 33.2* 28.0* 29.8* 32.2*  MCV 84.5  --  84.2 84.3  PLT 296  --  213 261    Cardiac Enzymes:  Recent Labs Lab 07/23/12 0625  TROPONINI >20.00*    BNP: No components found with this basename: POCBNP,   CBG: No results found for this basename: GLUCAP,  in the last 168 hours  Microbiology: Results for orders placed during the hospital encounter of 07/21/12  MRSA PCR SCREENING     Status: None   Collection Time    07/21/12  6:40 AM      Result Value Range Status   MRSA by PCR NEGATIVE  NEGATIVE Final   Comment:            The GeneXpert MRSA Assay (FDA     approved for NASAL specimens     only), is one component of a     comprehensive MRSA colonization     surveillance program. It is not     intended to diagnose MRSA     infection nor to guide or     monitor treatment for     MRSA infections.    Coagulation Studies:  Recent Labs  07/21/12 0330  LABPROT 12.5  INR 0.94    Urinalysis: No results found for this basename:  COLORURINE, APPERANCEUR, LABSPEC, PHURINE, GLUCOSEU, HGBUR, BILIRUBINUR, KETONESUR, PROTEINUR, UROBILINOGEN, NITRITE, LEUKOCYTESUR,  in the last 168 hours  Lipid Panel:     Component Value Date/Time   CHOL 226* 07/21/2012 0909   TRIG 142 07/21/2012 0909   HDL 64 07/21/2012 0909   CHOLHDL 3.5 07/21/2012 0909   VLDL 28 07/21/2012 0909   LDLCALC 134* 07/21/2012 0909    HgbA1C:  Lab Results  Component Value Date   HGBA1C 5.7* 07/21/2012    Urine Drug Screen:   No results found for this basename: labopia, cocainscrnur, labbenz, amphetmu, thcu, labbarb    Alcohol Level: No results found for this basename: ETH,  in the last 168 hours  Other results: EKG: NSR rate 95.  Imaging: Portable Chest Xray In Am  07/22/2012  *RADIOLOGY REPORT*  Clinical Data: Endotracheal tube.  Chest congestion.  Myocardial infarction.  PORTABLE CHEST - 1 VIEW  Comparison: Yesterday  Findings: Endotracheal and NG tubes removed.  Lungs under aerated with stable basilar atelectasis.  No pneumothorax.  No edema. Normal heart size.  IMPRESSION: Extubated.  Stable atelectasis.   Original Report Authenticated By: Jolaine Click, M.D.     Delton See PA-C [facility] Pager [phone] 07/23/2012, 4:22 PM    Patient seen and examined.  Clinical course and management discussed.  Necessary edits performed.  I agree with the above.  Assessment and plan of care developed and discussed below.     Assessment: 62 year old female with recent STEMI s/p stent placement who now complains of bilateral hand numbness that was noted s/p extubation and has persisted.  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  I suspect that this is compressive in etiology.  No weakness noted at this time.    Plan: 1.  Patient to make sure elbows are padded at all times when resting arms.  Expect symptoms to improve over the next 3-4 weeks.  If no improvement at that time patient may  have an outpatient NCV/EMG at that  time.  Patient does not feel that medication is necessary for paresthesias at this time.  2.  May consider OT consult 3.  A1c normal, TSH to be checked as well.   Thana Farr, MD [facility] [phone]  07/23/2012  5:10 PM

## 2012-07-23 NOTE — Progress Notes (Signed)
Patient Name: Wendy Reid Date of Encounter: 07/23/2012  Principal Problem:   STEMI (ST elevation myocardial infarction) Active Problems:   Anemia   HTN (hypertension)   GERD (gastroesophageal reflux disease)   Barrett's esophagus with esophagitis   Acute systolic congestive heart failure   Acute respiratory failure    SUBJECTIVE No acute events overnight. Feels significantly better, only complains of bilateral  hand numbness/tingling  CURRENT MEDS . alum & mag hydroxide-simeth  30 mL Oral Once  . aspirin  81 mg Oral Daily  . atorvastatin  80 mg Oral q1800  . carvedilol  12.5 mg Oral BID WC  . lisinopril  5 mg Oral Daily  . nitroGLYCERIN  2-200 mcg/min Intravenous Once  . pantoprazole (PROTONIX) IV  40 mg Intravenous Q12H  . potassium chloride  80 mEq Per Tube Once  . prasugrel  10 mg Oral Daily  . sodium chloride  3 mL Intravenous Q12H    OBJECTIVE  Filed Vitals:   07/23/12 0500 07/23/12 0600 07/23/12 0700 07/23/12 0804  BP: 106/66 106/65 107/75 119/75  Pulse: 99 106 25   Temp:      TempSrc:      Resp: 20 20 22    Height:      Weight:      SpO2: 94% 96% 96%     Intake/Output Summary (Last 24 hours) at 07/23/12 4782 Last data filed at 07/23/12 0100  Gross per 24 hour  Intake    604 ml  Output   1400 ml  Net   -796 ml   Filed Weights   07/21/12 0305 07/22/12 0500  Weight: 215 lb (97.523 kg) 228 lb 9.9 oz (103.7 kg)    PHYSICAL EXAM General: Pleasant, NAD. Neuro: Alert and oriented X 3. Moves all extremities spontaneously. Psych: Normal affect. HEENT:  Normal  Neck: Supple without bruits or JVD. Lungs:  Resp regular and unlabored, CTA. Heart: RRR (occassionally sinus tach), no s3, s4, or murmurs. Abdomen: Soft, non-tender, non-distended, BS + x 4.  Extremities: No clubbing, cyanosis or edema. DP/PT/Radials 2+ and equal bilaterally.  Accessory Clinical Findings  CBC  Recent Labs  07/21/12 0700 07/22/12 0500  WBC 8.3 7.7  HGB 10.1*  10.9*  HCT 29.8* 32.2*  MCV 84.2 84.3  PLT 213 261   Basic Metabolic Panel  Recent Labs  07/21/12 0504 07/21/12 0700 07/22/12 0500 07/23/12 0625  NA 141  --  136 138  K 2.7*  --  3.9 3.3*  CL 108  --  100 102  CO2  --   --  24 25  GLUCOSE 264*  --  118* 111*  BUN 11  --  9 8  CREATININE 0.60 0.66 0.63 0.85  CALCIUM  --   --  9.8 9.6  MG  --  1.9 2.1  --   PHOS  --   --  2.3  --    Liver Function Tests  Recent Labs  07/21/12 0314  AST 15  ALT 15  ALKPHOS 94  BILITOT 0.4  PROT 8.7*  ALBUMIN 3.8   Hemoglobin A1C  Recent Labs  07/21/12 0917  HGBA1C 5.7*   Fasting Lipid Panel  Recent Labs  07/21/12 0909  CHOL 226*  HDL 64  LDLCALC 134*  TRIG 142  CHOLHDL 3.5    TELE - sinus rhythm  Radiology/Studies  Portable Chest Xray In Am 07/22/2012   IMPRESSION: Extubated.  Stable atelectasis.   Original Report Authenticated By: Jolaine Click, M.D.  Dg Chest Portable 1 View 07/21/2012 IMPRESSION:  1.  Endotracheal tube seen ending 3-4 cm above the carina. 2.  Lungs hypoexpanded; vascular congestion, with mildly increased interstitial markings, raising question for mild interstitial edema.   Original Report Authenticated By: Tonia Ghent, M.D.    Dg Chest Portable 1 View 07/21/2012   IMPRESSION: Lungs hypoexpanded.  Vascular congestion noted, with bibasilar airspace opacities, raising question for mild interstitial edema.   Original Report Authenticated By: Tonia Ghent, M.D.     ASSESSMENT AND PLAN 62 yo F with h/o HTN, obesity, barrett's esophagus & asthma admitted from [facility] on 07/21/12 with STEMI s/p defibrillation x3 & CPR x2 minutes. Marland Kitchen Re-cath'ed in afternoon 07/21/12 due to severe chest pain. Had patent stent and no thrombus formation.   #STEMI with PEA/VF cardiac arrest: Patient is s/p cath, which revealed total occlusion of the LAD & also suggested severe segmental LV systolic dysfunction (EF 30%). Residual disease includes 30-40% stenosis of LCx.  S/p DES  to prox LAD on 07/21/12; Re-cath'ed on 07/21/12 due to severe chest pain, with patent stent and no thrombus formation. - continue Effient, ASA, lisinopril, coreg (increase to 12.5mg  BID), atorvastatin  - Cardiac rehab   #Acute systolic Heart failure: Secondary to STEMI (no prior echo in system). EF 30%.  - continue ACEI (lisinopril 5mg  daily) and coreg - Plan to titrate up the dosages if bp tolerate.  - follow echo results  #Hypokalemia: K = 3.3.  Replete with PO x 1.  Add on Magnesium.    #Acute Respiratory Failure - resolved. Extubated 07/21/12. No SOB.   #HTN: controlled  -continue lisinopril & coreg  # HLD: LDL 134. Continue atorvastatin  #Code Status: full   #Dispo: transfer to tele   Signed, Stacy Gardner MD, PGY 2  Attending Note:   The patient was seen and examined.  Agree with assessment and plan as noted above.  Changes made to the above note as needed.  She has done well from a cardiac standpoint.   Will transfer her to tele.  She has unexplained bilateral hand numbness.  She denies any neck pain.  It's possible that she had a minor neck injury during the intubation / CPR .  She does not have any further neurologic deficit.   I suspect this will gradually improve and we can work this up further as OP.   Will ask neuro to see to help arrange further work up.  Vesta Mixer, Montez Hageman., MD, [facility] 07/23/2012, 8:44 AM

## 2012-07-24 ENCOUNTER — Inpatient Hospital Stay (HOSPITAL_COMMUNITY): Payer: PRIVATE HEALTH INSURANCE

## 2012-07-24 LAB — URINALYSIS, ROUTINE W REFLEX MICROSCOPIC
Glucose, UA: NEGATIVE mg/dL
Nitrite: NEGATIVE
Specific Gravity, Urine: 1.016 (ref 1.005–1.030)
pH: 7.5 (ref 5.0–8.0)

## 2012-07-24 LAB — CBC
Hemoglobin: 10.3 g/dL — ABNORMAL LOW (ref 12.0–15.0)
MCH: 28.5 pg (ref 26.0–34.0)
Platelets: 242 10*3/uL (ref 150–400)
RBC: 3.62 MIL/uL — ABNORMAL LOW (ref 3.87–5.11)
WBC: 7.2 10*3/uL (ref 4.0–10.5)

## 2012-07-24 LAB — BASIC METABOLIC PANEL
BUN: 11 mg/dL (ref 6–23)
Chloride: 104 mEq/L (ref 96–112)
GFR calc Af Amer: 73 mL/min — ABNORMAL LOW (ref >90)
Potassium: 3.9 mEq/L (ref 3.5–5.1)
Sodium: 141 mEq/L (ref 135–145)

## 2012-07-24 LAB — TSH: TSH: 4.823 u[IU]/mL — ABNORMAL HIGH (ref 0.350–4.500)

## 2012-07-24 LAB — URINE MICROSCOPIC-ADD ON

## 2012-07-24 MED ORDER — CIPROFLOXACIN HCL 500 MG PO TABS
500.0000 mg | ORAL_TABLET | Freq: Two times a day (BID) | ORAL | Status: AC
  Administered 2012-07-24 – 2012-07-27 (×6): 500 mg via ORAL
  Filled 2012-07-24 (×7): qty 1

## 2012-07-24 MED ORDER — HYDROCOD POLST-CHLORPHEN POLST 10-8 MG/5ML PO LQCR
5.0000 mL | Freq: Three times a day (TID) | ORAL | Status: DC | PRN
  Administered 2012-07-24 – 2012-07-27 (×7): 5 mL via ORAL
  Filled 2012-07-24 (×7): qty 5

## 2012-07-24 MED FILL — Dextrose Inj 5%: INTRAVENOUS | Qty: 50 | Status: AC

## 2012-07-24 NOTE — Progress Notes (Signed)
Cardiology Progress Note Patient Name: Wendy Reid Date of Encounter: 07/24/2012, 8:40 AM     Subjective  One episode of mild chest pain this morning. C/o foul smelling urine and cough. Mild increase in temp last night to 100.6. Able to ambulate without chest pain yesterday.    Objective   Telemetry: sinus rhythm 80-100s  Medications: . alum & mag hydroxide-simeth  30 mL Oral Once  . aspirin  81 mg Oral Daily  . atorvastatin  80 mg Oral q1800  . carvedilol  12.5 mg Oral BID WC  . lisinopril  5 mg Oral Daily  . nitroGLYCERIN  2-200 mcg/min Intravenous Once  . pantoprazole  40 mg Oral BID AC  . potassium chloride  80 mEq Per Tube Once  . prasugrel  10 mg Oral Daily  . sodium chloride  3 mL Intravenous Q12H   . sodium chloride 1,000 mL (07/22/12 0124)  . nitroGLYCERIN Stopped (07/22/12 1200)    Physical Exam: Temp:  [98.1 F (36.7 C)-100.6 F (38.1 C)] 98.5 F (36.9 C) (02/17 0500) Pulse Rate:  [90-110] 110 (02/17 0832) Resp:  [18-30] 18 (02/17 0832) BP: (85-135)/(39-91) 135/91 mmHg (02/17 0832) SpO2:  [95 %-99 %] 97 % (02/17 0832) Weight:  [227 lb 3.2 oz (103.057 kg)-227 lb 8.2 oz (103.2 kg)] 227 lb 8.2 oz (103.2 kg) (02/17 0500)  General: Overweight black female, in no acute distress. Head: Normocephalic, atraumatic, sclera non-icteric, nares are without discharge.  Neck: Right neck IV. Negative for carotid bruits or JVD Lungs: Clear bilaterally to auscultation without wheezes, rales, or rhonchi. Breathing is unlabored. Heart: RRR S1 S2 without murmurs, rubs, or gallops.  Abdomen: Soft, non-tender, non-distended with normoactive bowel sounds. No rebound/guarding. No obvious abdominal masses. Msk:  Strength and tone appear normal for age. Extremities: Right groin without bleeding or hematoma. No edema. No clubbing or cyanosis. Distal pedal pulses are intact and equal bilaterally. Neuro: Alert and oriented X 3. Moves all extremities spontaneously. Psych:   Responds to questions appropriately with a normal affect.   Intake/Output Summary (Last 24 hours) at 07/24/12 0840 Last data filed at 07/24/12 0501  Gross per 24 hour  Intake    883 ml  Output   1000 ml  Net   -117 ml    Labs:  Recent Labs  07/22/12 0500 07/23/12 0625 07/23/12 0830 07/24/12 0500  NA 136 138  --  141  K 3.9 3.3*  --  3.9  CL 100 102  --  104  CO2 24 25  --  29  GLUCOSE 118* 111*  --  102*  BUN 9 8  --  11  CREATININE 0.63 0.85  --  0.96  CALCIUM 9.8 9.6  --  9.8  MG 2.1  --  2.1  --   PHOS 2.3  --   --   --     07/22/12 0500  WBC 7.7  HGB 10.9*  HCT 32.2*  MCV 84.3  PLT 261    07/23/12 0625  TROPONINI >20.00*    07/21/12 0917  HGBA1C 5.7*      07/21/12 0909  CHOL 226*  HDL 64  LDLCALC 134*  TRIG 142  CHOLHDL 3.5    Radiology/Studies:   07/22/2012 - PORTABLE CHEST - 1 VIEW   Findings: Endotracheal and NG tubes removed.  Lungs under aerated with stable basilar atelectasis.  No pneumothorax.  No edema. Normal heart size.  IMPRESSION: Extubated.  Stable atelectasis.  07/21/2012  - PORTABLE CHEST - 1 VIEW   Findings: The patient's endotracheal tube is seen ending 3-4 cm above the carina.  An enteric tube is seen extending below the diaphragm.  The lungs remain hypoexpanded.  Vascular crowding and vascular congestion are seen.  Mildly increased interstitial markings raise question for mild interstitial edema. No pleural effusion or pneumothorax is seen.  The cardiomediastinal silhouette is borderline in size.  No acute osseous abnormalities are identified.  IMPRESSION:  1.  Endotracheal tube seen ending 3-4 cm above the carina. 2.  Lungs hypoexpanded; vascular congestion, with mildly increased interstitial markings, raising question for mild interstitial edema.      07/21/12 - Cardiac Cath Hemodynamics:  AO 99/70  LV 100/30  Coronary angiography:  Coronary dominance: right  Left mainstem: Widely patent with minor luminal irregularity. The  vessel bifurcates into the LAD and left circumflex.  Left anterior descending (LAD): The LAD is 100% occluded in the proximal aspect of the vessel. This has been graphic appearance of an acute occlusion.  Left circumflex (LCx): The left circumflex is tortuous. This is a large caliber vessel. There is 30-40% stenosis in the proximal vessel an area of acute angulation. There is a large obtuse marginal branch with no significant stenosis.  Right coronary artery (RCA): Widely patent, large, dominant vessel. The PDA and PLA branches are patent. There is a large acute marginal branch that is also widely patent.  Left ventriculography: Left ventricular systolic function is severely depressed. There is akinesis of the anterior wall and apex. The estimated left ventricular ejection fraction is 30%.  PCI Data:  Vessel - LAD/Segment - proximal  Percent Stenosis (pre) 100  TIMI-flow 0  Stent 3.5 x 24 mm drug-eluting  Percent Stenosis (post) 0  TIMI-flow (post) 3  Final Conclusions:  1. Acute anterior wall myocardial infarction complicated by ventricular fibrillation cardiac arrest  2. Total occlusion of the proximal LAD, treated successfully with primary PCI utilizing a drug-eluting stent  3. Widely patent right coronary artery and left circumflex vessels  4. Severe segmental left ventricular systolic dysfunction  Recommendations: Post MI medical therapy with treatment in the cardiac intensive care unit. The patient will be loaded with effient through her orogastric tube. We will continue bivalirudin for at least 2 hours.  07/21/12 - Repeat Cardiac Cath Hemodynamics:  AO123/87 mean 104 mm Hg  Coronary angiography:  Coronary dominance: right  Left mainstem:Normal  Left anterior descending (LAD): The proximal LAD stent is widely patent with TIMI 3 flow.  Ramus intermediate is small with 70% disease.  Left circumflex (LCx): normal.  Right coronary artery (RCA): Not performed.  Final Conclusions:  Continued patency of stent in the proximal LAD.  Recommendations: continue medical management.     Assessment and Plan   1. Cardiac Arrest: VF arrest requiring ACLS in the setting of acute STEMI.   2. Acute Anterior STEMI: Totally occluded prox LAD s/p DES on 2/14. Otherwise nonobstructive disease in RCA and LCx. Repeat cath on 2/14 for recurrent chest pain showed widely patent stent. ST elevations improving on EKG. Cont ASA, Effient, BB, ACEI, statin.   3. Acute Systolic CHF: EF 16% in the setting of #2. Euvolemic on exam. Weight stable. Echo read pending. Cont BB and ACEI and up-titrate as tolerated. Will need repeat echo 3 months after maximum medical therapy to reassess EF.   4. Hyperlipidemia: LDL 134. LFTs ok. Cont statin.  5. Hypertension: BP Stable. Cont to monitor with up-titration of cardiac meds  6. Elevated temp: Patient c/o cough and foul smelling urine. Temp up to 100.6. Will check CBC, UA, and CXR.   7. Acute Respiratory Failure: 2/2 cardiac arrest. Resolved  8. Hypokalemia: Resolved  9. Hand Numbness: Pt c/o bilat hand numbness/tingling. Appreciate neuro consultation. Felt compressive in etiology and expected to improve over the next 3-4 weeks. Can follow up with Neuro as an outpatient if does not improve. TSH pending.  Signed, HOPE, JESSICA PA-C   Patient seen, examined. Available data reviewed. Agree with findings, assessment, and plan as outlined by [facility], PA-C. Exam reveals a pleasant woman in NAD, JVP normal, lungs clear, and heart RRR. There is no lower extremity edema. Labs, CXR, urinalysis reviewed. Will increase frequency of cough medication, add antibiotic for UTI, and continue current cardiac meds. Echo needs to be done - may need Lifevest if LVEF remains severely depressed (less than 35%).  Tonny Bollman, M.D. 07/24/2012 10:05 PM

## 2012-07-24 NOTE — Progress Notes (Signed)
Pt declined walking due to her complex regional pain syndrome in right foot. Sts she needs shoes to wear of she can't grip the floor. Son to bring shoes this evening. Pt has been walking in room and will continue to do so today. Will ambulate tomorrow. Began ed with pt. Will f/u. [phone] Ethelda Chick CES, ACSM

## 2012-07-24 NOTE — Progress Notes (Signed)
0825 Pt called and complaint of chest pain  Described as achy ,happened suddenly  Non radiating 6/10  Pain on touch  To mid sternal  Chest region . Pt in good ,pleasant mood this time . Pain exacerbate on coughing . EGD done .  1610 .Pain subsiding. Will notify the MD

## 2012-07-24 NOTE — Progress Notes (Signed)
Utilization Review Completed.   Tahesha Skeet, RN, BSN Nurse Case Manager  [phone]  

## 2012-07-24 NOTE — Progress Notes (Signed)
ANTIBIOTIC CONSULT NOTE - INITIAL  Pharmacy Consult for Cipro Indication: uncomplicated UTI  Allergies  Allergen Reactions  . Dilaudid (Hydromorphone Hcl) Nausea And Vomiting  . Vicodin (Hydrocodone-Acetaminophen) Nausea And Vomiting  . Zofran Nausea And Vomiting    Patient Measurements: Height: 5' 1.5" (156.2 cm) Weight: 227 lb 8.2 oz (103.2 kg) (SCALE b) IBW/kg (Calculated) : 48.95  Vital Signs: Temp: 99.9 F (37.7 C) (02/17 2134) Temp src: Oral (02/17 2134) BP: 95/57 mmHg (02/17 2134) Pulse Rate: 96 (02/17 2134) Intake/Output from previous day: 02/16 0701 - 02/17 0700 In: 883 [P.O.:880; I.V.:3] Out: 1000 [Urine:1000] Intake/Output from this shift:    Labs:  Recent Labs  07/22/12 0500 07/23/12 0625 07/24/12 0500 07/24/12 1048  WBC 7.7  --   --  7.2  HGB 10.9*  --   --  10.3*  PLT 261  --   --  242  CREATININE 0.63 0.85 0.96  --    Estimated Creatinine Clearance: 69.6 ml/min (by C-G formula based on Cr of 0.96).    Microbiology: Recent Results (from the past 720 hour(s))  MRSA PCR SCREENING     Status: None   Collection Time    07/21/12  6:40 AM      Result Value Range Status   MRSA by PCR NEGATIVE  NEGATIVE Final   Comment:            The GeneXpert MRSA Assay (FDA     approved for NASAL specimens     only), is one component of a     comprehensive MRSA colonization     surveillance program. It is not     intended to diagnose MRSA     infection nor to guide or     monitor treatment for     MRSA infections.    Medical History: Past Medical History  Diagnosis Date  . Barrett's syndrome     hx of  . Asthma   . Anemia     hx hemolytic anemia  . GERD (gastroesophageal reflux disease)   . Sleep apnea     STOPBANG=4  . Hypertension   . HTN (hypertension) 11/01/2011  . 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 Asthma in adult 11/01/2011  . Barrett's esophagus with esophagitis 11/01/2011    Patient states no evidence of  barrett's from last surveillance EGD on 11/2010  . Stress bladder incontinence, female 11/01/2011  . 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 Hx of cholecystectomy 11/01/2011    04/2010  . Complex regional pain syndrome of lower limb     Medications:  Scheduled:  . alum & mag hydroxide-simeth  30 mL Oral Once  . aspirin  81 mg Oral Daily  . atorvastatin  80 mg Oral q1800  . carvedilol  12.5 mg Oral BID WC  . lisinopril  5 mg Oral Daily  . pantoprazole  40 mg Oral BID AC  . potassium chloride  80 mEq Per Tube Once  . prasugrel  10 mg Oral Daily  . sodium chloride  3 mL Intravenous Q12H  . [DISCONTINUED] nitroGLYCERIN  2-200 mcg/min Intravenous Once   Assessment: 62 yo F admitted 07/21/12 with VF arrest requiring ACLS in the setting of acute STEMI.  Pt is s/p DES to LAD and continues on ASA, Effient, BB, ACEi, and statin.  Pt complains today of mild temp (100.6 overnight) and foul smelling urine.  Urine Cx sent.  Pharmacy asked to dose Cipro  for uncomplicated UTI.  Goal of Therapy:  Renal adjustment of therapy Eradication of infection  Plan:  Cipro 500 mg PO Q 12h x 3 days. Pharmacy will sign off. If urine culture is resistant to Cipro or renal function changes requiring dose adjustment, pharmacy monitoring software will alert [place] in order to change therapies.  [employer], Pharm.D., BCPS Clinical Pharmacist Pager [phone] 07/24/2012 10:35 PM

## 2012-07-25 LAB — BASIC METABOLIC PANEL
BUN: 14 mg/dL (ref 6–23)
CO2: 25 mEq/L (ref 19–32)
Glucose, Bld: 112 mg/dL — ABNORMAL HIGH (ref 70–99)
Potassium: 3.4 mEq/L — ABNORMAL LOW (ref 3.5–5.1)
Sodium: 136 mEq/L (ref 135–145)

## 2012-07-25 LAB — T4, FREE: Free T4: 1.2 ng/dL (ref 0.80–1.80)

## 2012-07-25 MED ORDER — POTASSIUM CHLORIDE CRYS ER 20 MEQ PO TBCR
40.0000 meq | EXTENDED_RELEASE_TABLET | Freq: Once | ORAL | Status: AC
  Administered 2012-07-25: 40 meq via ORAL
  Filled 2012-07-25: qty 2

## 2012-07-25 MED ORDER — PROMETHAZINE HCL 25 MG PO TABS
25.0000 mg | ORAL_TABLET | Freq: Three times a day (TID) | ORAL | Status: DC | PRN
  Administered 2012-07-25: 25 mg via ORAL
  Filled 2012-07-25: qty 1

## 2012-07-25 NOTE — Progress Notes (Signed)
07/25/12 1025 Noted new orders for LifeVest.  TC to Morrie Sheldon, LifeVest representative [phone]), to give referral.  Pt. will be Inpatient for at least 1 more day. NCM to follow for further discharge needs. Tera Mater, RN, BSN NCM [phone]

## 2012-07-25 NOTE — Progress Notes (Signed)
Cardiology Progress Note Patient Name: Wendy Reid Date of Encounter: 07/25/2012, 8:37 AM     Subjective  Cough is improving. Mild intermittent chest pain. No shortness of breath.    Objective   Telemetry: Sinus rhythm 90-100s  Medications: . alum & mag hydroxide-simeth  30 mL Oral Once  . aspirin  81 mg Oral Daily  . atorvastatin  80 mg Oral q1800  . carvedilol  12.5 mg Oral BID WC  . ciprofloxacin  500 mg Oral BID  . lisinopril  5 mg Oral Daily  . pantoprazole  40 mg Oral BID AC  . prasugrel  10 mg Oral Daily  . sodium chloride  3 mL Intravenous Q12H      Physical Exam: Temp:  [99.1 F (37.3 C)-99.9 F (37.7 C)] 99.2 F (37.3 C) (02/18 0608) Pulse Rate:  [94-110] 105 (02/18 0608) Resp:  [20] 20 (02/18 0608) BP: (95-121)/(55-73) 121/72 mmHg (02/18 0608) SpO2:  [95 %-100 %] 95 % (02/18 0608) Weight:  [228 lb 12.8 oz (103.783 kg)] 228 lb 12.8 oz (103.783 kg) (02/18 5621)  General: Overweight black female, in no acute distress.  Head: Normocephalic, atraumatic, sclera non-icteric, nares are without discharge.  Neck: Right neck IV. Negative for carotid bruits or JVD  Lungs: Clear bilaterally to auscultation without wheezes, rales, or rhonchi. Breathing is unlabored.  Heart: RRR S1 S2 without murmurs, rubs, or gallops.  Abdomen: Soft, non-tender, non-distended with normoactive bowel sounds. No rebound/guarding. No obvious abdominal masses.  Msk: Strength and tone appear normal for age.  Extremities: Right groin without bleeding or hematoma. No edema. No clubbing or cyanosis. Distal pedal pulses are intact and equal bilaterally.  Neuro: Alert and oriented X 3. Moves all extremities spontaneously.  Psych: Responds to questions appropriately with a normal affect.    Intake/Output Summary (Last 24 hours) at 07/25/12 0837 Last data filed at 07/25/12 0830  Gross per 24 hour  Intake    720 ml  Output    600 ml  Net    120 ml    Labs:  Recent Labs  07/23/12 0830 07/24/12 0500 07/25/12 0635  NA  --  141 136  K  --  3.9 3.4*  CL  --  104 100  CO2  --  29 25  GLUCOSE  --  102* 112*  BUN  --  11 14  CREATININE  --  0.96 0.94  CALCIUM  --  9.8 9.5  MG 2.1  --   --     07/24/12 1048  WBC 7.2  HGB 10.3*  HCT 30.9*  MCV 85.4  PLT 242    07/23/12 0625  TROPONINI >20.00*    07/24/12 0500  TSH 4.823*    07/21/12 0917   HGBA1C  5.7*     07/21/12 0909   CHOL  226*   HDL  64   LDLCALC  134*   TRIG  142   CHOLHDL  3.5     Radiology/Studies:   07/24/2012  - PORTABLE CHEST - 1 VIEW   Findings: Normal heart size.  No pleural effusion or edema.  Lung volumes are low.  No airspace consolidation identified.  IMPRESSION:  1.  No acute cardiopulmonary abnormalities   07/21/12 - Cardiac Cath  Hemodynamics:  AO 99/70  LV 100/30  Coronary angiography:  Coronary dominance: right  Left mainstem: Widely patent with minor luminal irregularity. The vessel bifurcates into the LAD and left circumflex.  Left anterior descending (  LAD): The LAD is 100% occluded in the proximal aspect of the vessel. This has been graphic appearance of an acute occlusion.  Left circumflex (LCx): The left circumflex is tortuous. This is a large caliber vessel. There is 30-40% stenosis in the proximal vessel an area of acute angulation. There is a large obtuse marginal branch with no significant stenosis.  Right coronary artery (RCA): Widely patent, large, dominant vessel. The PDA and PLA branches are patent. There is a large acute marginal branch that is also widely patent.  Left ventriculography: Left ventricular systolic function is severely depressed. There is akinesis of the anterior wall and apex. The estimated left ventricular ejection fraction is 30%.  PCI Data:  Vessel - LAD/Segment - proximal  Percent Stenosis (pre) 100  TIMI-flow 0  Stent 3.5 x 24 mm drug-eluting  Percent Stenosis (post) 0  TIMI-flow (post) 3  Final Conclusions:  1. Acute  anterior wall myocardial infarction complicated by ventricular fibrillation cardiac arrest  2. Total occlusion of the proximal LAD, treated successfully with primary PCI utilizing a drug-eluting stent  3. Widely patent right coronary artery and left circumflex vessels  4. Severe segmental left ventricular systolic dysfunction  Recommendations: Post MI medical therapy with treatment in the cardiac intensive care unit. The patient will be loaded with effient through her orogastric tube. We will continue bivalirudin for at least 2 hours.   07/21/12 - Repeat Cardiac Cath  Hemodynamics:  AO123/87 mean 104 mm Hg  Coronary angiography:  Coronary dominance: right  Left mainstem:Normal  Left anterior descending (LAD): The proximal LAD stent is widely patent with TIMI 3 flow.  Ramus intermediate is small with 70% disease.  Left circumflex (LCx): normal.  Right coronary artery (RCA): Not performed.  Final Conclusions: Continued patency of stent in the proximal LAD.  Recommendations: continue medical management.  07/22/12 - Echo Study Conclusions: - Left ventricle: The cavity size was mildly dilated. There was focal basal hypertrophy. Systolic function was moderately to severely reduced. The estimated ejection fraction was in the range of 30% to 35%. Akinesis of the mid-distalanterior and apical myocardium. There was an increased relative contribution of atrial contraction to ventricular filling. Doppler parameters are consistent with abnormal left ventricular relaxation (grade 1 diastolic dysfunction). - Aortic valve: Trivial regurgitation.     Assessment and Plan   1. Cardiac Arrest: VF arrest requiring ACLS in the setting of acute STEMI.   2. Acute Anterior STEMI: Totally occluded prox LAD s/p DES on 2/14. Otherwise nonobstructive disease in RCA and LCx. Repeat cath on 2/14 for recurrent chest pain showed widely patent stent. ST elevations improving on EKG. Cont ASA, Effient, BB, ACEI, statin.    3. Acute Systolic CHF: EF 16-10% in the setting of #2. Euvolemic on exam. Weight stable. Cont BB and ACEI and up-titrate as tolerated (SBP 90s-120s). Echo 2/15 shows EF 30-35%, grade 1 diastoilc dysfunction, akinesis of the mid-distalanterior and apical myocardium. Will need Lifevest at discharge for prevention of SCD.  4. Hyperlipidemia: LDL 134. LFTs ok. Cont statin.   5. Hypertension: BP Stable. Cont to monitor with up-titration of cardiac meds   6. UTI: Low grade temp, UA (+) 2/17, initiated on Cipro 500mg  BID x 6 doses. UCx pending  7. Acute Respiratory Failure: 2/2 cardiac arrest. Resolved   8. Hypokalemia: 3.4 today. Will supplement.    9. Hand Numbness: Pt c/o bilat hand numbness/tingling. Appreciate neuro consultation. Felt compressive in etiology and expected to improve over the next 3-4 weeks. Can follow up  with Neuro as an outpatient if does not improve.   10. Mildly elevated TSH: TSH 4.823 in the setting of acute illness. Will check Free T3/4   Signed, HOPE, JESSICA PA-C  Patient seen, examined. Available data reviewed. Agree with findings, assessment, and plan as outlined by [facility], PA-C. Exam reveals pleasant woman in NAD. Heart is regular without murmur, lungs CTA, and no peripheral edema. Echo data reviewed. Recommend LifeVest at discharge considering LVEF less than 35% after AMI. She understands rationale for this and I would anticipate 3 months duration with reassessment of LV function by echo at that time. Anticipate discharge home tomorrow. Otherwise as per note above.  Tonny Bollman, M.D. 07/25/2012 1:14 PM

## 2012-07-25 NOTE — Progress Notes (Signed)
CARDIAC REHAB PHASE I   PRE:  Rate/Rhythm: 82 SR    BP: sitting 96/66    SaO2:   MODE:  Ambulation: 220 ft   POST:  Rate/Rhythm: 107ST then brief run 117 ST    BP: sitting 104/66     SaO2: 96 RA  Pt required assistance (asked him) from son to don her tennis shoes. Exerted after this and had to rest in recliner (had not stood yet). Stood and ambulated down hall, staying close to railing. DOE and needed multiple rest breaks for fairly short distance. Pt sts 1-2 months ago she was doing bike and elliptical for 1 hr at gym (for her right leg therapy). Exhausted after walk, return to recliner. Pt lives alone. Her children are in collage and do not live with her. Feel pt is not ready for d/c in am. Will f/u am to further therapy. [phone]  Wendy Reid CES, ACSM

## 2012-07-26 ENCOUNTER — Encounter (HOSPITAL_COMMUNITY): Payer: Self-pay | Admitting: Cardiology

## 2012-07-26 ENCOUNTER — Telehealth: Payer: Self-pay | Admitting: Cardiovascular Disease

## 2012-07-26 LAB — BASIC METABOLIC PANEL
CO2: 26 mEq/L (ref 19–32)
Chloride: 101 mEq/L (ref 96–112)
Glucose, Bld: 112 mg/dL — ABNORMAL HIGH (ref 70–99)
Potassium: 3.8 mEq/L (ref 3.5–5.1)
Sodium: 135 mEq/L (ref 135–145)

## 2012-07-26 LAB — URINE CULTURE

## 2012-07-26 MED ORDER — BISACODYL 5 MG PO TBEC: 5.0000 mg | DELAYED_RELEASE_TABLET | Freq: Every day | ORAL | Status: DC | PRN

## 2012-07-26 MED ORDER — BISACODYL 5 MG PO TBEC: 5.0000 mg | DELAYED_RELEASE_TABLET | Freq: Once | ORAL | Status: DC

## 2012-07-26 MED ORDER — SENNA 8.6 MG PO TABS
1.0000 | ORAL_TABLET | Freq: Every day | ORAL | Status: DC
  Administered 2012-07-26: 8.6 mg via ORAL
  Filled 2012-07-26 (×2): qty 1

## 2012-07-26 NOTE — Discharge Summary (Signed)
Discharge Summary   Patient ID: Wendy Reid MRN: [id number], DOB/AGE: 61-Jul-1952 61 y.o.  Primary MD: Rosario Adie, MD Primary Cardiologist: Dr. Excell Seltzer Admit date: 07/21/2012 D/C date:     07/27/2012      Primary Discharge Diagnoses:  1. Cardiac Arrest  - VF arrest requiring ACLS in the setting of acute STEMI  2. Acute Anterior STEMI  - Totally occluded prox LAD s/p DES on 2/14, otherwise nonobstructive disease in RCA and LCx, EF 30% by LV gram   3. Acute Systolic CHF  - Echo 2/15 shows EF 30-35%, grade 1 diastolic dysfunction, akinesis of the mid-distalanterior and apical myocardium  - Lifevest for ~ 3mos for prevention of SCD then repeat echo  4. Hyperlipidemia  - LDL 134, Initiated on statin  - F/u Lipids/LFTs in 6 wks  5. Hypotension  - Low normal BP during hospitalization limiting up-titration of cardiac meds   6. PROTEUS MIRABILIS UTI  - Completed course of Cipro   7. Acute Respiratory Failure  - In the setting of cardiac arrest   8. Hypokalemia  - F/u BMET  9. Hand Numbness  - Neuro consultation - felt compressive in etiology and expected to improve over the next 3-4 weeks  - Can follow up with Neuro as an outpatient if does not improve  10. Mildly elevated TSH  - TSH 4.823 in the setting of acute illness, Normal Free T3/4   Secondary Discharge Diagnoses:  . Asthma   . GERD (gastroesophageal reflux disease)   . Sleep apnea     STOPBANG=4  . 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 Barrett's esophagus with esophagitis 11/01/2011    Patient states no evidence of barrett's from last surveillance EGD on 11/2010  . Stress bladder incontinence, female 11/01/2011  . 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 Complex regional pain syndrome of lower limb     Allergies Allergen Reactions  . Dilaudid (Hydromorphone Hcl) Nausea And Vomiting  . Vicodin (Hydrocodone-Acetaminophen) Nausea And Vomiting  . Zofran Nausea And Vomiting     Diagnostic Studies/Procedures:   07/21/12 - Cardiac Cath  Hemodynamics:  AO 99/70  LV 100/30  Coronary angiography:  Coronary dominance: right  Left mainstem: Widely patent with minor luminal irregularity. The vessel bifurcates into the LAD and left circumflex.  Left anterior descending (LAD): The LAD is 100% occluded in the proximal aspect of the vessel. This has been graphic appearance of an acute occlusion.  Left circumflex (LCx): The left circumflex is tortuous. This is a large caliber vessel. There is 30-40% stenosis in the proximal vessel an area of acute angulation. There is a large obtuse marginal branch with no significant stenosis.  Right coronary artery (RCA): Widely patent, large, dominant vessel. The PDA and PLA branches are patent. There is a large acute marginal branch that is also widely patent.  Left ventriculography: Left ventricular systolic function is severely depressed. There is akinesis of the anterior wall and apex. The estimated left ventricular ejection fraction is 30%.  PCI Data:  Vessel - LAD/Segment - proximal  Percent Stenosis (pre) 100  TIMI-flow 0  Stent 3.5 x 24 mm drug-eluting  Percent Stenosis (post) 0  TIMI-flow (post) 3  Final Conclusions:  1. Acute anterior wall myocardial infarction complicated by ventricular fibrillation cardiac arrest  2. Total occlusion of the proximal LAD, treated successfully with primary PCI utilizing a drug-eluting stent  3. Widely patent right coronary artery and left circumflex  vessels  4. Severe segmental left ventricular systolic dysfunction  Recommendations: Post MI medical therapy with treatment in the cardiac intensive care unit. The patient will be loaded with effient through her orogastric tube. We will continue bivalirudin for at least 2 hours.   07/21/12 - Repeat Cardiac Cath  Hemodynamics:  AO123/87 mean 104 mm Hg  Coronary angiography:  Coronary dominance: right  Left mainstem:Normal  Left anterior  descending (LAD): The proximal LAD stent is widely patent with TIMI 3 flow.  Ramus intermediate is small with 70% disease.  Left circumflex (LCx): normal.  Right coronary artery (RCA): Not performed.  Final Conclusions: Continued patency of stent in the proximal LAD.  Recommendations: continue medical management.   07/22/12 - Echo  Study Conclusions: - Left ventricle: The cavity size was mildly dilated. There was focal basal hypertrophy. Systolic function was moderately to severely reduced. The estimated ejection fraction was in the range of 30% to 35%. Akinesis of the mid-distalanterior and apical myocardium. There was an increased relative contribution of atrial contraction to ventricular filling. Doppler parameters are consistent with abnormal left ventricular relaxation (grade 1 diastolic dysfunction). - Aortic valve: Trivial regurgitation.   History of Present Illness: 62 y.o. female w/ the above medical problems who transferred from [facility] to [facility] on 07/21/12 with complaints of chest pain and EKG concerning for anterior STEMI. No prior cardiac history and reportedly normal stress test in ~2012. Had stuttering chest pain for a few days prior to presentation and then developed severe chest pain the evening of presentation with sob prompting her to seek medical attention.   Hospital Course: At [facility] long EKG showed NSR w/ ST elevation, probable acute anteroseptal infarction. A code STEMI was called and while being prepared for transport she arrested with VT/VF rhythm on the monitor. She was intubated, defibrillated 3 times, and CPR was done for less than 5 minutes. She was successfully resuscitated with return of spontaneous circulation. She was transported to the [facility] cath lab where cath showed a totally occluded prox LAD, otherwise nonobstructive disease, EF 30%. She was successfully treated with a DES to the prox LAD. She tolerated the procedure well without  complications. She was extubated on 07/21/12. Post cath she complained of recurrent severe chest pain and was therefore taken back to the cath lab. Cath revealed widely patent stent without thrombosis. She was placed on ASA, Effient, BB, ACEI, and statin. Up-titration was limited by her soft BP and ultimately, we had to discontinue her ACEI therapy. Echo showed EF 30-35%, grade 1 diastolic dysfunction, akinesis of the mid-distalanterior and apical myocardium. It was felt she would benefit from placement of a LifeVest for prevention of SCD. Will continue to treat medically and repeat echo in about 3 months to reassess LV function and consider need for ICD implantation.   She developed a low grade fever and complained of foul smelling urine. CXR was without acute cardiopulmonary findings. UA was positive and UCx grew Proteus Mirabilis. She was treated with a course of Cipro. She complained of bilateral hand numbness for which she was evaluated by Neurology. It was felt to be compressive in etiology and thought to likely improve over the next 3-4 weeks. She was instructed to follow up with Neurology as needed for this issue.   She was able to ambulate without difficulty and cath site remained stable. The LifeVest was placed and she was education regarding its use. She was seen and evaluated by Dr. Excell Seltzer who felt she  was stable for discharge home with plans for follow up as scheduled below.  Discharge Vitals: Blood pressure 110/70, pulse 93, temperature 98.5 F (36.9 C), temperature source Oral, resp. rate 20, height 5' 1.5" (1.562 m), weight 228 lb (103.42 kg), SpO2 99.00%.  Labs: Component Value Date   WBC 7.2 07/24/2012   HGB 10.3* 07/24/2012   HCT 30.9* 07/24/2012   MCV 85.4 07/24/2012   PLT 242 07/24/2012    Recent Labs Lab 07/21/12 0314 07/26/12 0500  NA 137 135  K 2.8* 3.8  CL 100 101  CO2 24 26  BUN 12 13  CREATININE 0.66 0.94  CALCIUM 9.9 9.7  PROT 8.7*  --   BILITOT 0.4  --   ALKPHOS  94  --   ALT 15  --   AST 15  --   GLUCOSE 161* 112*      07/24/12 0500   TSH  4.823*     07/25/2012 07:57  Free T4 1.20  T3, Free 3.2    07/21/12 0917   HGBA1C  5.7*     07/21/12 0909   CHOL  226*   HDL  64   LDLCALC  134*   TRIG  142   CHOLHDL  3.5       07/23/2012 06:25  Troponin I >20.00 (HH)     Discharge Medications     Medication List    STOP taking these medications       amLODipine 5 MG tablet  Commonly known as:  NORVASC      TAKE these medications       albuterol (2.5 MG/3ML) 0.083% nebulizer solution  Commonly known as:  PROVENTIL  Take 2.5 mg by nebulization every 4 (four) hours as needed for wheezing or shortness of breath.     albuterol 108 (90 BASE) MCG/ACT inhaler  Commonly known as:  PROVENTIL HFA;VENTOLIN HFA  Inhale 2 puffs into the lungs every 4 (four) hours as needed for wheezing or shortness of breath.     aspirin 81 MG chewable tablet  Chew 1 tablet (81 mg total) by mouth daily.     atorvastatin 80 MG tablet  Commonly known as:  LIPITOR  Take 1 tablet (80 mg total) by mouth daily at 6 PM.     carvedilol 12.5 MG tablet  Commonly known as:  COREG  Take 1 tablet (12.5 mg total) by mouth 2 (two) times daily with a meal.     chlorpheniramine-HYDROcodone 10-8 MG/5ML Lqcr  Commonly known as:  TUSSIONEX  Take 5 mLs by mouth every 8 (eight) hours as needed.     nitroGLYCERIN 0.4 MG SL tablet  Commonly known as:  NITROSTAT  Place 1 tablet (0.4 mg total) under the tongue every 5 (five) minutes as needed for chest pain.     nortriptyline 10 MG capsule  Commonly known as:  PAMELOR  Take 10 mg by mouth at bedtime.     oxyCODONE 5 MG immediate release tablet  Commonly known as:  Oxy IR/ROXICODONE  Take 5 mg by mouth every 6 (six) hours as needed for pain.     pantoprazole 40 MG tablet  Commonly known as:  PROTONIX  Take 40 mg by mouth daily with breakfast.     prasugrel 10 MG Tabs  Commonly known as:  EFFIENT  Take 1 tablet  (10 mg total) by mouth daily.     VITAMIN B COMPLEX-C PO  Take 1 tablet by mouth daily.  Disposition   Discharge Orders   Future Appointments Provider Department Dept Phone   08/02/2012 11:15 AM [facility] Main Office [place]) [phone]   08/02/2012 11:30 AM Beatrice Lecher, PA [facility] Main Office [place]) [phone]   Future Orders Complete By Expires     Amb Referral to Cardiac Rehabilitation  As directed       Follow-up Information   Follow up with Tereso Newcomer, PA On 08/02/2012. (11:30. Please arrive early or stay after for lab work)    Solicitor information:   Architectural technologist [address] [place] [address] [phone]       Follow up with Rosario Adie, MD. (As needed)    Contact information:   [address] [place] [address] [phone]        Outstanding Labs/Studies:  Lipids/LFTs in 6 wks Echo in 3 mos  Duration of Discharge Encounter: Greater than 30 minutes including physician and PA time.  Signed, Nicolasa Ducking, NP 07/27/2012, 4:16 PM

## 2012-07-26 NOTE — Telephone Encounter (Signed)
New problem    Per Shanda Bumps PA. TOC appt on  2/26 @ 11:30 with Tereso Newcomer.

## 2012-07-26 NOTE — Progress Notes (Signed)
CARDIAC REHAB PHASE I   PRE:  Rate/Rhythm: 87 SR    BP: sitting 90/60    SaO2: 98 RA  MODE:  Ambulation: 200 ft   POST:  Rate/Rhythm: 100 ST    BP: sitting 90/60     SaO2: 100 RA  Pt somewhat less SOB today but increased lightheadedness. Felt almost faint walking. Held to wall, many rests. BP low. Tired after walk. Does st she will have family staying with her at home. Right now she should not walk by herself. Ed completed and interested in CRPII. Spoke with CM about getting Effient book. Pt sts she has major diet change to make.  [phone]  Harriet Masson CES, ACSM

## 2012-07-26 NOTE — Progress Notes (Signed)
    Subjective:  No chest pain or dyspnea this morning. She did feel somewhat weak. Remains anxious about leaving the hospital considering all she's been through.  Objective:  Vital Signs in the last 24 hours: Temp:  [98.1 F (36.7 C)-100.3 F (37.9 C)] 98.8 F (37.1 C) (02/19 1511) Pulse Rate:  [84-85] 84 (02/19 1511) Resp:  [16-20] 18 (02/19 1511) BP: (93-104)/(57-69) 96/59 mmHg (02/19 1511) SpO2:  [93 %-97 %] 96 % (02/19 1511) Weight:  [103.5 kg (228 lb 2.8 oz)] 103.5 kg (228 lb 2.8 oz) (02/19 0355)  Intake/Output from previous day: 02/18 0701 - 02/19 0700 In: 703 [P.O.:700; I.V.:3] Out: 750 [Urine:750]  Physical Exam: Pt is alert and oriented, NAD HEENT: normal Neck: JVP - normal, carotids 2+= without bruits Lungs: CTA bilaterally CV: RRR without murmur or gallop Abd: soft, NT, Positive BS, no hepatomegaly Ext: no C/C/E, distal pulses intact and equal Skin: warm/dry no rash  Lab Results:  Recent Labs  07/24/12 1048  WBC 7.2  HGB 10.3*  PLT 242    Recent Labs  07/25/12 0635 07/26/12 0500  NA 136 135  K 3.4* 3.8  CL 100 101  CO2 25 26  GLUCOSE 112* 112*  BUN 14 13  CREATININE 0.94 0.94   No results found for this basename: TROPONINI, CK, MB,  in the last 72 hours  Tele: Sinus rhythm, no significant arrhythmia. Heart rate is in the 90s  Assessment/Plan:  #1. Acute anterior STEMI complicated by ventricular fibrillation cardiac arrest 2. Acute systolic heart failure with left ventricular ejection fraction 30-35%, secondary to #1 3. Hypertension 4. Hyperlipidemia 5. Urinary tract infection, now on Cipro  The patient is slowly improving. She will be fitted for a life vest today. I would anticipate discharge tomorrow. With her weakness and lightheadedness, we may have to back off on some of her cardiac medication.  Tonny Bollman, M.D. 07/26/2012, 7:08 PM

## 2012-07-27 LAB — BASIC METABOLIC PANEL
BUN: 12 mg/dL (ref 6–23)
CO2: 22 mEq/L (ref 19–32)
Chloride: 101 mEq/L (ref 96–112)
GFR calc non Af Amer: 69 mL/min — ABNORMAL LOW (ref >90)
Glucose, Bld: 94 mg/dL (ref 70–99)
Potassium: 3.9 mEq/L (ref 3.5–5.1)
Sodium: 134 mEq/L — ABNORMAL LOW (ref 135–145)

## 2012-07-27 MED ORDER — LISINOPRIL 2.5 MG PO TABS
2.5000 mg | ORAL_TABLET | Freq: Every day | ORAL | Status: DC
  Administered 2012-07-27: 2.5 mg via ORAL
  Filled 2012-07-27: qty 1

## 2012-07-27 MED ORDER — HYDROCOD POLST-CHLORPHEN POLST 10-8 MG/5ML PO LQCR: 5.0000 mL | Freq: Three times a day (TID) | ORAL | Status: DC | PRN

## 2012-07-27 MED ORDER — CARVEDILOL 12.5 MG PO TABS: 12.5000 mg | ORAL_TABLET | Freq: Two times a day (BID) | ORAL | Status: DC

## 2012-07-27 MED ORDER — PRASUGREL HCL 10 MG PO TABS: 10.0000 mg | ORAL_TABLET | Freq: Every day | ORAL | Status: DC

## 2012-07-27 MED ORDER — ASPIRIN 81 MG PO CHEW: 81.0000 mg | CHEWABLE_TABLET | Freq: Every day | ORAL | Status: DC

## 2012-07-27 MED ORDER — ATORVASTATIN CALCIUM 80 MG PO TABS: 80.0000 mg | ORAL_TABLET | Freq: Every day | ORAL | Status: DC

## 2012-07-27 MED ORDER — NITROGLYCERIN 0.4 MG SL SUBL: 0.4000 mg | SUBLINGUAL_TABLET | SUBLINGUAL | Status: DC | PRN

## 2012-07-27 NOTE — Progress Notes (Signed)
[id number] Cardiac Rehab On arrival pt states that she had called nurse for chest pain. She states not bad pain just felt some. " I had to get IV Morphine for it the other night but it is not that bad that need that now". Pt states that she is going home. Explained to her that I would like to walk her since she felt dizzy ambulating yesterday. She is anxious about feeling dizzy when she is ambulating and states that she does not feel ready to go. RN to call MD to see if he is going to reduce some of her medications and she will call me to ambulate her. Will follow pt later today.

## 2012-07-27 NOTE — Progress Notes (Signed)
    Subjective:  No chest pain or dyspnea. She felt dizzy with walking yesterday. She did not have syncope or near syncope.  Objective:  Vital Signs in the last 24 hours: Temp:  [97.4 F (36.3 C)-100.1 F (37.8 C)] 98.4 F (36.9 C) (02/20 0937) Pulse Rate:  [84-107] 98 (02/20 0937) Resp:  [18] 18 (02/20 0937) BP: (96-110)/(59-71) 107/71 mmHg (02/20 0937) SpO2:  [96 %-100 %] 96 % (02/20 0937) Weight:  [103.42 kg (228 lb)] 103.42 kg (228 lb) (02/20 0450)  Intake/Output from previous day: 02/19 0701 - 02/20 0700 In: 920 [P.O.:920] Out: 1100 [Urine:1100]  Physical Exam: Pt is alert and oriented, NAD HEENT: normal Neck: JVP - normal Lungs: CTA bilaterally CV: RRR without murmur or gallop Abd: soft, NT, Positive BS, no hepatomegaly Ext: no C/C/E, distal pulses intact and equal Skin: warm/dry no rash  Lab Results: No results found for this basename: WBC, HGB, PLT,  in the last 72 hours  Recent Labs  07/26/12 0500 07/27/12 0452  NA 135 134*  K 3.8 3.9  CL 101 101  CO2 26 22  GLUCOSE 112* 94  BUN 13 12  CREATININE 0.94 0.89   No results found for this basename: TROPONINI, CK, MB,  in the last 72 hours  Tele: Sinus rhythm with heart rate in the 90s.  Assessment/Plan:  1. Acute anterior ST elevation MI complicated by ventricular fibrillation cardiac arrest. 2 acute systolic heart failure with left ventricular ejection fraction less than 35% 3. Hypertension, now with low blood pressure following her MI 4. Hyperlipidemia  The patient was fitted for a life vest. I think she is stable for discharge this afternoon. I am going to discontinue her ACE inhibitor because of low blood pressure. She should remain on carvedilol, aspirin, effient, and a high potency statin drug. She should see be seen back in followup within one week in the office. Extensive education was given. The patient is a retired Engineer, civil (consulting) and she understands her instructions well.  Tonny Bollman,  M.D. 07/27/2012, 1:32 PM

## 2012-07-27 NOTE — Telephone Encounter (Signed)
Left message for call back at [phone]. Other phone number is disconnected.

## 2012-07-27 NOTE — Progress Notes (Signed)
Patient's IV and telemetry has been discontinued; patient verbalizes understanding of discharge instructions; patient will be discharged home with family and is currently awaiting life vest education.  Lorretta Harp RN

## 2012-07-27 NOTE — Progress Notes (Signed)
CARDIAC REHAB PHASE I   PRE:  Rate/Rhythm: 84 SR    BP: sitting 110/70    SaO2: 98 RA  MODE:  Ambulation: 200 ft   POST:  Rate/Rhythm: 104    BP: sitting 112/70     SaO2:   Pt nervous about d/c. Sts she is still dizzy when up in room. Likes to hold to furniture/railing. At door of room pt suddenly unstable and held to wall. Stated she lost her footing. Walked aobut 30 more feet and suddenly grabbed to wall to support herself. Stated she was lightheaded, "like her eyes crossed". Pt became tearful and called loudly for her son. Sat for a few minutes. BP 116/80 with dinamapp. Able to walk rest of walk but would lurch for railing or furniture instead of trusting her feet. Return to recliner. Feel pt is suffering more from anxiety but she continues to st she is dizzy. Recommend RW for home for safey and feeling secure. PTs family will be with her per pt.  [id number]  Harriet Masson CES, ACSM

## 2012-07-27 NOTE — Progress Notes (Signed)
07/27/12  1515 Pt. To dc home today with LifeVest.  Pt. Requested a rolling walker.  TC to [facility] with [facility], to give referral for DME.  Tera Mater, RN, BSN NCM [phone]

## 2012-07-27 NOTE — Progress Notes (Signed)
Utilization Review Completed.   Judas Mohammad, RN, BSN Nurse Case Manager  [phone]  

## 2012-07-28 NOTE — Telephone Encounter (Signed)
Spoke with pt - doing OK since her left the hospital - did not have any questions except if it is OK for her to sit under the dryer when she gets her hair done next week.  Advised OK to sit under dryer.

## 2012-07-31 ENCOUNTER — Inpatient Hospital Stay (HOSPITAL_COMMUNITY)
Admission: EM | Admit: 2012-07-31 | Discharge: 2012-08-01 | DRG: 313 | Disposition: A | Discharge status: Home / Self Care | Payer: PRIVATE HEALTH INSURANCE | Attending: Cardiovascular Disease | Admitting: Cardiovascular Disease

## 2012-07-31 ENCOUNTER — Emergency Department (HOSPITAL_COMMUNITY): Payer: PRIVATE HEALTH INSURANCE

## 2012-07-31 ENCOUNTER — Encounter (HOSPITAL_COMMUNITY): Payer: Self-pay | Admitting: Emergency Medicine

## 2012-07-31 DIAGNOSIS — G894 Chronic pain syndrome: Secondary | ICD-10-CM | POA: Diagnosis present

## 2012-07-31 DIAGNOSIS — I5022 Chronic systolic (congestive) heart failure: Secondary | ICD-10-CM | POA: Diagnosis present

## 2012-07-31 DIAGNOSIS — Z8674 Personal history of sudden cardiac arrest: Secondary | ICD-10-CM

## 2012-07-31 DIAGNOSIS — N393 Stress incontinence (female) (male): Secondary | ICD-10-CM | POA: Diagnosis present

## 2012-07-31 DIAGNOSIS — Z6841 Body Mass Index (BMI) 40.0 and over, adult: Secondary | ICD-10-CM

## 2012-07-31 DIAGNOSIS — E669 Obesity, unspecified: Secondary | ICD-10-CM | POA: Diagnosis present

## 2012-07-31 DIAGNOSIS — J45909 Unspecified asthma, uncomplicated: Secondary | ICD-10-CM | POA: Diagnosis present

## 2012-07-31 DIAGNOSIS — Z9089 Acquired absence of other organs: Secondary | ICD-10-CM

## 2012-07-31 DIAGNOSIS — I200 Unstable angina: Secondary | ICD-10-CM | POA: Diagnosis present

## 2012-07-31 DIAGNOSIS — Z90710 Acquired absence of both cervix and uterus: Secondary | ICD-10-CM

## 2012-07-31 DIAGNOSIS — I509 Heart failure, unspecified: Secondary | ICD-10-CM | POA: Diagnosis present

## 2012-07-31 DIAGNOSIS — R079 Chest pain, unspecified: Secondary | ICD-10-CM

## 2012-07-31 DIAGNOSIS — Z7982 Long term (current) use of aspirin: Secondary | ICD-10-CM

## 2012-07-31 DIAGNOSIS — M79609 Pain in unspecified limb: Secondary | ICD-10-CM | POA: Diagnosis present

## 2012-07-31 DIAGNOSIS — Z888 Allergy status to other drugs, medicaments and biological substances status: Secondary | ICD-10-CM

## 2012-07-31 DIAGNOSIS — E785 Hyperlipidemia, unspecified: Secondary | ICD-10-CM | POA: Diagnosis present

## 2012-07-31 DIAGNOSIS — K219 Gastro-esophageal reflux disease without esophagitis: Secondary | ICD-10-CM | POA: Diagnosis present

## 2012-07-31 DIAGNOSIS — D599 Acquired hemolytic anemia, unspecified: Secondary | ICD-10-CM | POA: Diagnosis present

## 2012-07-31 DIAGNOSIS — Z79899 Other long term (current) drug therapy: Secondary | ICD-10-CM

## 2012-07-31 DIAGNOSIS — I10 Essential (primary) hypertension: Secondary | ICD-10-CM | POA: Diagnosis present

## 2012-07-31 DIAGNOSIS — Z9861 Coronary angioplasty status: Secondary | ICD-10-CM

## 2012-07-31 DIAGNOSIS — I252 Old myocardial infarction: Secondary | ICD-10-CM

## 2012-07-31 DIAGNOSIS — R0789 Other chest pain: Principal | ICD-10-CM | POA: Diagnosis present

## 2012-07-31 DIAGNOSIS — I2510 Atherosclerotic heart disease of native coronary artery without angina pectoris: Secondary | ICD-10-CM | POA: Diagnosis present

## 2012-07-31 DIAGNOSIS — IMO0002 Reserved for concepts with insufficient information to code with codable children: Secondary | ICD-10-CM | POA: Diagnosis present

## 2012-07-31 DIAGNOSIS — Z885 Allergy status to narcotic agent status: Secondary | ICD-10-CM

## 2012-07-31 LAB — CBC
HCT: 28.9 % — ABNORMAL LOW (ref 36.0–46.0)
RBC: 3.43 MIL/uL — ABNORMAL LOW (ref 3.87–5.11)
RDW: 13.7 % (ref 11.5–15.5)
WBC: 6.3 10*3/uL (ref 4.0–10.5)

## 2012-07-31 LAB — COMPREHENSIVE METABOLIC PANEL
ALT: 26 U/L (ref 0–35)
AST: 25 U/L (ref 0–37)
Albumin: 3.5 g/dL (ref 3.5–5.2)
Alkaline Phosphatase: 88 U/L (ref 39–117)
BUN: 9 mg/dL (ref 6–23)
Chloride: 102 mEq/L (ref 96–112)
Potassium: 3.6 mEq/L (ref 3.5–5.1)
Total Bilirubin: 0.6 mg/dL (ref 0.3–1.2)

## 2012-07-31 LAB — TROPONIN I
Troponin I: 0.85 ng/mL (ref <0.30)
Troponin I: 0.93 ng/mL (ref <0.30)

## 2012-07-31 LAB — BASIC METABOLIC PANEL
BUN: 9 mg/dL (ref 6–23)
CO2: 24 mEq/L (ref 19–32)
Chloride: 101 mEq/L (ref 96–112)
GFR calc Af Amer: 85 mL/min — ABNORMAL LOW (ref >90)
Potassium: 3.7 mEq/L (ref 3.5–5.1)

## 2012-07-31 LAB — LIPID PANEL
HDL: 45 mg/dL (ref >39)
LDL Cholesterol: 85 mg/dL (ref 0–99)
Total CHOL/HDL Ratio: 3.2 RATIO
VLDL: 15 mg/dL (ref 0–40)

## 2012-07-31 LAB — HEMOGLOBIN A1C: Hgb A1c MFr Bld: 5.5 % (ref <5.7)

## 2012-07-31 LAB — POCT I-STAT TROPONIN I

## 2012-07-31 MED ORDER — HYDROCOD POLST-CHLORPHEN POLST 10-8 MG/5ML PO LQCR: 5.0000 mL | Freq: Three times a day (TID) | ORAL | Status: DC | PRN

## 2012-07-31 MED ORDER — PANTOPRAZOLE SODIUM 40 MG PO TBEC
40.0000 mg | DELAYED_RELEASE_TABLET | Freq: Every day | ORAL | Status: DC
  Administered 2012-07-31 – 2012-08-01 (×2): 40 mg via ORAL
  Filled 2012-07-31 (×2): qty 1

## 2012-07-31 MED ORDER — ASPIRIN 325 MG PO TABS
325.0000 mg | ORAL_TABLET | Freq: Once | ORAL | Status: AC
  Administered 2012-07-31: 325 mg via ORAL
  Filled 2012-07-31: qty 1

## 2012-07-31 MED ORDER — CARVEDILOL 12.5 MG PO TABS
12.5000 mg | ORAL_TABLET | Freq: Two times a day (BID) | ORAL | Status: DC
  Administered 2012-07-31 – 2012-08-01 (×2): 12.5 mg via ORAL
  Filled 2012-07-31 (×5): qty 1

## 2012-07-31 MED ORDER — ATORVASTATIN CALCIUM 80 MG PO TABS
80.0000 mg | ORAL_TABLET | Freq: Every day | ORAL | Status: DC
  Administered 2012-07-31: 80 mg via ORAL
  Filled 2012-07-31 (×2): qty 1

## 2012-07-31 MED ORDER — ASPIRIN 81 MG PO CHEW
81.0000 mg | CHEWABLE_TABLET | Freq: Every day | ORAL | Status: DC
  Administered 2012-08-01: 81 mg via ORAL
  Filled 2012-07-31: qty 1

## 2012-07-31 MED ORDER — HEPARIN BOLUS VIA INFUSION: 4000.0000 [IU] | Freq: Once | INTRAVENOUS | Status: DC

## 2012-07-31 MED ORDER — SENNA 8.6 MG PO TABS
1.0000 | ORAL_TABLET | Freq: Two times a day (BID) | ORAL | Status: DC | PRN
  Administered 2012-07-31: 8.6 mg via ORAL
  Filled 2012-07-31 (×2): qty 1

## 2012-07-31 MED ORDER — NITROGLYCERIN 0.4 MG SL SUBL: 0.4000 mg | SUBLINGUAL_TABLET | SUBLINGUAL | Status: DC | PRN

## 2012-07-31 MED ORDER — ACETAMINOPHEN 325 MG PO TABS: 650.0000 mg | ORAL_TABLET | ORAL | Status: DC | PRN

## 2012-07-31 MED ORDER — NORTRIPTYLINE HCL 10 MG PO CAPS
10.0000 mg | ORAL_CAPSULE | Freq: Every day | ORAL | Status: DC
  Filled 2012-07-31 (×2): qty 1

## 2012-07-31 MED ORDER — HYDROCOD POLST-CHLORPHEN POLST 10-8 MG/5ML PO LQCR
5.0000 mL | Freq: Three times a day (TID) | ORAL | Status: DC | PRN
  Administered 2012-07-31 – 2012-08-01 (×3): 5 mL via ORAL
  Filled 2012-07-31 (×3): qty 5

## 2012-07-31 MED ORDER — ONDANSETRON HCL 4 MG/2ML IJ SOLN: 4.0000 mg | Freq: Four times a day (QID) | INTRAMUSCULAR | Status: DC | PRN

## 2012-07-31 MED ORDER — ASPIRIN 81 MG PO CHEW: 324.0000 mg | CHEWABLE_TABLET | Freq: Once | ORAL | Status: DC

## 2012-07-31 MED ORDER — HEPARIN SODIUM (PORCINE) 5000 UNIT/ML IJ SOLN
4000.0000 [IU] | Freq: Once | INTRAMUSCULAR | Status: AC
  Administered 2012-07-31: 4000 [IU] via INTRAVENOUS
  Filled 2012-07-31: qty 1

## 2012-07-31 MED ORDER — ALBUTEROL SULFATE HFA 108 (90 BASE) MCG/ACT IN AERS: 2.0000 | INHALATION_SPRAY | RESPIRATORY_TRACT | Status: DC | PRN

## 2012-07-31 MED ORDER — SODIUM CHLORIDE 0.9 % IV SOLN
250.0000 mL | INTRAVENOUS | Status: DC | PRN
  Administered 2012-07-31: 250 mL via INTRAVENOUS

## 2012-07-31 MED ORDER — PRASUGREL HCL 10 MG PO TABS
10.0000 mg | ORAL_TABLET | Freq: Every day | ORAL | Status: DC
  Administered 2012-07-31 – 2012-08-01 (×2): 10 mg via ORAL
  Filled 2012-07-31 (×2): qty 1

## 2012-07-31 MED ORDER — SODIUM CHLORIDE 0.9 % IJ SOLN: 3.0000 mL | INTRAMUSCULAR | Status: DC | PRN

## 2012-07-31 MED ORDER — HEPARIN (PORCINE) IN NACL 100-0.45 UNIT/ML-% IJ SOLN
1350.0000 [IU]/h | INTRAMUSCULAR | Status: DC
  Administered 2012-08-01: 1250 [IU]/h via INTRAVENOUS
  Filled 2012-07-31 (×2): qty 250

## 2012-07-31 MED ORDER — HEPARIN (PORCINE) IN NACL 100-0.45 UNIT/ML-% IJ SOLN
1000.0000 [IU]/h | INTRAMUSCULAR | Status: DC
  Administered 2012-07-31: 1000 [IU]/h via INTRAVENOUS
  Filled 2012-07-31: qty 250

## 2012-07-31 MED ORDER — OXYCODONE HCL 5 MG PO TABS: 5.0000 mg | ORAL_TABLET | Freq: Four times a day (QID) | ORAL | Status: DC | PRN

## 2012-07-31 MED ORDER — SODIUM CHLORIDE 0.9 % IJ SOLN: 3.0000 mL | Freq: Two times a day (BID) | INTRAMUSCULAR | Status: DC

## 2012-07-31 MED ORDER — ASPIRIN 325 MG PO TABS: 325.0000 mg | ORAL_TABLET | Freq: Once | ORAL | Status: DC

## 2012-07-31 NOTE — Progress Notes (Signed)
Anticoagulation Consult Note: Heparin per Pharmacy  Indication: chest pain/ACS Heparin level = 0.48 on 1000 units/hr Goal heparin level = 0.3-0.7  Heparin level is within the desired therapeutic range. No adjustments needed.  Cardell Peach, PharmD

## 2012-07-31 NOTE — Progress Notes (Signed)
ANTICOAGULATION CONSULT NOTE - Initial Consult  Pharmacy Consult for heparin Indication: chest pain/ACS  Allergies  Allergen Reactions  . Dilaudid (Hydromorphone Hcl) Nausea And Vomiting  . Vicodin (Hydrocodone-Acetaminophen) Nausea And Vomiting  . Zofran Nausea And Vomiting    Patient Measurements: Height: 5' 1.42" (156 cm) Weight: 227 lb 15.3 oz (103.4 kg) (per 07/27/12 documentation) IBW/kg (Calculated) : 48.76 Heparin Dosing Weight: 74 kg  Vital Signs: Temp: 98.4 F (36.9 C) (02/24 0211) Temp src: Oral (02/24 0211) BP: 96/54 mmHg (02/24 0240) Pulse Rate: 76 (02/24 0240)  Labs:  Recent Labs  07/31/12 0233 07/31/12 0314  HGB 9.7*  --   HCT 28.9*  --   PLT 265  --   CREATININE 0.85  --   TROPONINI  --  0.85*    Estimated Creatinine Clearance: 78.4 ml/min (by C-G formula based on Cr of 0.85).   Medical History: Past Medical History  Diagnosis Date  . Asthma   . GERD (gastroesophageal reflux disease)   . Sleep apnea     STOPBANG=4  . Hypertension   . 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 Barrett's esophagus with esophagitis 11/01/2011    Patient states no evidence of barrett's from last surveillance EGD on 11/2010  . Stress bladder incontinence, female 11/01/2011  . 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 Complex regional pain syndrome of lower limb   . Coronary artery disease     STEMI 07/2012 Cath totally occluded prox LAD s/p DES, otherwise nonobstructive dz in RCA and LCx, EF 30%  . Systolic CHF     Echo 07/22/12 EF 30-35%, grade 1 diastolic dysfunction, akinesis of the mid-distalanterior and apical myocardium  . Cardiac arrest     V.Fib arrest 07/2012 in the setting of acute anterior STEMI; Discharged with Lifevest  . Hyperlipidemia     Medications:  Scheduled:  . aspirin  81 mg Oral Daily  . aspirin  325 mg Oral Once  . atorvastatin  80 mg Oral q1800  . carvedilol  12.5 mg Oral BID WC  . [COMPLETED] heparin  4,000 Units  Intravenous Once  . nortriptyline  10 mg Oral QHS  . pantoprazole  40 mg Oral Q breakfast  . prasugrel  10 mg Oral Daily  . sodium chloride  3 mL Intravenous Q12H  . [DISCONTINUED] aspirin  324 mg Oral Once    Assessment: 62 yo female admitted with chest pain. Pharmacy to manage IV heparin. Patient has already received 4000 units IV heparin.   Goal of Therapy:  Heparin level 0.3-0.7 units/ml Monitor platelets by anticoagulation protocol: Yes   Plan:  1. Heparin IV infusion of 1000 units/hr.  2. Heparin level in 6 hours.  3. Daily CBC, heparin level.  Emeline Gins 07/31/2012,4:16 AM

## 2012-07-31 NOTE — H&P (Signed)
History and Physical   Admit date: 07/31/2012 Name:  Wendy Reid Medical record number: [id number] DOB/Age:  61/23/1952  62 y.o. female  Referring Physician: [facility]  Primary Cardiologist:  Dr. Tonny Bollman  Primary Physician: Dr. Reola Calkins  Chief complaint/reason for admission:  Chest pain  HPI:  This 62 year old female has a history of hypertension and hemolytic anemia and had an acute anterior infarction complicated by cardiac arrest requiring ACLS. She had a drug-eluting stent placed in the LAD and had reduced systolic function. She was discharged home on the 19th on a lifevest. ACE inhibitor was discontinued because of low blood pressure. She was sore in her chest but had been feeling well. Today she complained of some left chest pain from her CPR and took an oxycodone but then developed burning anterior chest discomfort this evening similar to her previous discomfort that she had prior to her infarction. She took one nitroglycerin and called EMS and EMS brought her to the emergency room. Her EKG showed an evolving anterior infarction and her troponin was mildly elevated.  Past Medical History  Diagnosis Date  . Asthma   . GERD (gastroesophageal reflux disease)   . Sleep apnea     STOPBANG=4  . Hypertension   . 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 Barrett's esophagus with esophagitis 11/01/2011    Patient states no evidence of barrett's from last surveillance EGD on 11/2010  . Stress bladder incontinence, female 11/01/2011  . 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 Complex regional pain syndrome of lower limb   . Coronary artery disease     STEMI 07/2012 Cath totally occluded prox LAD s/p DES, otherwise nonobstructive dz in RCA and LCx, EF 30%  . Systolic CHF     Echo 07/22/12 EF 30-35%, grade 1 diastolic dysfunction, akinesis of the mid-distalanterior and apical myocardium  . Cardiac arrest     V.Fib arrest 07/2012 in the setting of  acute anterior STEMI; Discharged with Lifevest  . Hyperlipidemia      Past Surgical History  Procedure Laterality Date  . Cholecystectomy  nov 2011  . Appendectomy  as teenager  . Tonsillectomy  as teenager  . Abdominal hysterectomy  1999    complete  . Both knees arthroscopic sugery last done 2007    . Total knee arthroplasty  10/29/2011    Procedure: TOTAL KNEE ARTHROPLASTY;  Surgeon: Eugenia Mcalpine, MD;  Location: [facility];  Service: Orthopedics;  Laterality: Right;  . Coronary angioplasty with stent placement Left 07/21/2012    LAD x 1   .  Allergies: is allergic to dilaudid; vicodin; and zofran.   Medications: Prior to Admission medications   Medication Sig Start Date End Date Taking? Authorizing Provider  albuterol (PROVENTIL HFA;VENTOLIN HFA) 108 (90 BASE) MCG/ACT inhaler Inhale 2 puffs into the lungs every 4 (four) hours as needed for wheezing or shortness of breath.   Yes Historical Provider, MD  albuterol (PROVENTIL) (2.5 MG/3ML) 0.083% nebulizer solution Take 2.5 mg by nebulization every 4 (four) hours as needed for wheezing or shortness of breath.   Yes Historical Provider, MD  aspirin 325 MG tablet Take 325 mg by mouth once.   Yes Historical Provider, MD  aspirin 81 MG chewable tablet Chew 1 tablet (81 mg total) by mouth daily. 07/27/12  Yes Ok Anis, NP  atorvastatin (LIPITOR) 80 MG tablet Take 1 tablet (80 mg total) by mouth daily at 6 PM. 07/27/12  Yes Ok Anis, NP  carvedilol (COREG) 12.5 MG tablet Take 1 tablet (12.5 mg total) by mouth 2 (two) times daily with a meal. 07/27/12  Yes Ok Anis, NP  chlorpheniramine-HYDROcodone (TUSSIONEX) 10-8 MG/5ML LQCR Take 5 mLs by mouth every 8 (eight) hours as needed. 07/27/12  Yes Ok Anis, NP  nitroGLYCERIN (NITROSTAT) 0.4 MG SL tablet Place 1 tablet (0.4 mg total) under the tongue every 5 (five) minutes as needed for chest pain. 07/27/12  Yes Ok Anis, NP  nortriptyline (PAMELOR) 10  MG capsule Take 10 mg by mouth at bedtime.   Yes Historical Provider, MD  oxyCODONE (OXY IR/ROXICODONE) 5 MG immediate release tablet Take 5 mg by mouth every 6 (six) hours as needed for pain.   Yes Historical Provider, MD  pantoprazole (PROTONIX) 40 MG tablet Take 40 mg by mouth daily with breakfast.    Yes Historical Provider, MD  prasugrel (EFFIENT) 10 MG TABS Take 1 tablet (10 mg total) by mouth daily. 07/27/12  Yes Ok Anis, NP  VITAMIN B COMPLEX-C PO Take 1 tablet by mouth daily.   Yes Historical Provider, MD    Family History:  No family status information on file.    Social History:   reports that she has never smoked. She has never used smokeless tobacco. She reports that she does not drink alcohol or use illicit drugs.   History   Social History Narrative  . No narrative on file     Review of Systems: Other than as noted above, the remainder of the review of systems is unchanged from her recent admission.  Physical Exam: BP 96/54  Pulse 76  Temp(Src) 98.4 F (36.9 C) (Oral)  Resp 24  SpO2 99% General appearance: alert, cooperative, appears stated age and moderately obese Head: Normocephalic, without obvious abnormality, atraumatic Neck: no adenopathy, no carotid bruit, no JVD and supple, symmetrical, trachea midline Lungs: clear to auscultation bilaterally Heart: regular rate and rhythm, S1, S2 normal, no murmur, click, rub or gallop Pelvic: deferred Extremities: extremities normal, atraumatic, no cyanosis or edema Pulses: 2+ and symmetric Neurologic: Grossly normal  Labs: CBC  Recent Labs  07/31/12 0233  WBC 6.3  RBC 3.43*  HGB 9.7*  HCT 28.9*  PLT 265  MCV 84.3  MCH 28.3  MCHC 33.6  RDW 13.7   CMP   Recent Labs  07/31/12 0233  NA 135  K 3.7  CL 101  CO2 24  GLUCOSE 120*  BUN 9  CREATININE 0.85  CALCIUM 9.3  GFRNONAA 73*  GFRAA 85*   BNP (last 3 results)  Recent Labs  07/21/12 0314  PROBNP <5.0   Cardiac Panel (last 3  results)  Recent Labs  07/31/12 0314  TROPONINI 0.85*   Thyroid  Lab Results  Component Value Date   TSH 4.823* 07/24/2012    EKG: Previous anterior infarction  Radiology: No acute pulmonary disease   IMPRESSIONS: 1. Chest pain consistent with recurrent angina in a patient with a previous anterior infarction, cardiac arrest and previous drug-eluting stent 2. Coronary artery disease with previous LAD occlusion 13 days ago 3. History of hypertension 4. History of hemolytic anemia 5. Esophageal reflux 6. Hyperlipidemia 7. Previous cardiac arrest 8. Previous anterior infarction 9. Mild elevated troponin of unclear significance-it is unclear whether this is residual from her infarction or whether this is a new elevation.  PLAN: Patient will be placed on heparin and continued on antiplatelets. Keep n.p.o. depending on what Dr. Excell Seltzer  wishes to do in terms of further workup in light of the mildly elevated troponin. Evaluate serial enzymes. Admit to step down.  Signed: Darden Palmer MD [facility]  07/31/2012, 4:09 AM

## 2012-07-31 NOTE — Care Management Note (Signed)
    Page 1 of 1   07/31/2012     8:39:11 AM   CARE MANAGEMENT NOTE 07/31/2012  Patient:  Wendy Reid, Wendy Reid   Account Number:  [id number]  Date Initiated:  07/31/2012  Documentation initiated by:  Junius Creamer  Subjective/Objective Assessment:   adm w ch pain, pos troponins     Action/Plan:   lives w fam, has rw and lifevest   Anticipated DC Date:     Anticipated DC Plan:        DC Planning Services  CM consult      Choice offered to / List presented to:             Status of service:   Medicare Important Message given?   (If response is "NO", the following Medicare IM given date fields will be blank) Date Medicare IM given:   Date Additional Medicare IM given:    Discharge Disposition:    Per UR Regulation:  Reviewed for med. necessity/level of care/duration of stay  If discussed at Long Length of Stay Meetings, dates discussed:    Comments:  2/24 0837 debbie Lasha Echeverria rn,bsn

## 2012-07-31 NOTE — ED Provider Notes (Signed)
Medical screening examination/treatment/procedure(s) were conducted as a shared visit with non-physician practitioner(s) and myself.  I personally evaluated the patient during the encounter.    S: about 10 min of severe CP while at rest at home, resolved with NTG. No pain in ED. Recent stent. O: heart RRR. Lungs CTA, MAE x 4 no c/c/e A/P: elevated troponin, abnl ECG no sig changes from previous. CAR consult for admit, Heparin and ASA provided.   Sunnie Nielsen, MD 07/31/12 2256

## 2012-07-31 NOTE — Progress Notes (Signed)
Patient Name: Wendy Reid Date of Encounter: 07/31/2012  Principal Problem:   Unstable angina Active Problems:   Coronary artery disease   History of acute anterior wall myocardial infarction   Obesity (BMI 30-39.9)    SUBJECTIVE: No more chest pain, no SOB. Feels she may have panicked yesterday but her chest pain did resolve with NTG.  OBJECTIVE Filed Vitals:   07/31/12 0500 07/31/12 0530 07/31/12 0600 07/31/12 0720  BP: 102/60  105/65 109/74  Pulse: 88  83 83  Temp:  98 F (36.7 C)  98 F (36.7 C)  TempSrc:    Oral  Resp:    18  Height:      Weight:      SpO2: 98%  94% 96%    Intake/Output Summary (Last 24 hours) at 07/31/12 7846 Last data filed at 07/31/12 0600  Gross per 24 hour  Intake  19.34 ml  Output      0 ml  Net  19.34 ml   Filed Weights   07/31/12 0300  Weight: 227 lb 15.3 oz (103.4 kg)    PHYSICAL EXAM General: Well developed, well nourished, female in no acute distress. Head: Normocephalic, atraumatic.  Neck: Supple without bruits, JVD not elevated. Lungs:  Resp regular and unlabored, CTA bilaterally. Heart: RRR, S1, S2, no S3, S4, or murmur; no rub. Abdomen: Soft, non-tender, non-distended, BS + x 4.  Extremities: No clubbing, cyanosis, no edema.  Neuro: Alert and oriented X 3. Moves all extremities spontaneously. Psych: Normal affect.  LABS: CBC:  Recent Labs  07/31/12 0233  WBC 6.3  HGB 9.7*  HCT 28.9*  MCV 84.3  PLT 265   INR:  Recent Labs  07/31/12 0700  INR 1.08   Basic Metabolic Panel:  Recent Labs  96/29/52 0233 07/31/12 0405  NA 135 136  K 3.7 3.6  CL 101 102  CO2 24 24  GLUCOSE 120* 122*  BUN 9 9  CREATININE 0.85 0.84  CALCIUM 9.3 9.5   Liver Function Tests:  Recent Labs  07/31/12 0405  AST 25  ALT 26  ALKPHOS 88  BILITOT 0.6  PROT 8.4*  ALBUMIN 3.5   Cardiac Enzymes:  Recent Labs  07/31/12 0314 07/31/12 0410  TROPONINI 0.85* 0.93*   Troponin I  Date Value Range Status    07/31/2012 0.93* <0.30 ng/mL Final  07/31/2012 0.85* <0.30 ng/mL Final  07/23/2012 >20.00* <0.30 ng/mL Final    Recent Labs  07/31/12 0254  TROPIPOC 0.90*   Hemoglobin A1C:No results found for this basename: HGBA1C,  in the last 72 hours Fasting Lipid Panel:  Recent Labs  07/31/12 0410  CHOL 145  HDL 45  LDLCALC 85  TRIG 75  CHOLHDL 3.2   TELE:   SR     ECG: 31-Jul-2012 02:09:32  SINUS RHYTHM ~ normal P axis, V-rate 50- 99 LOW VOLTAGE WITH RIGHT AXIS DEVIATION ~ low voltage, RAD PROBABLE RIGHT VENTRICULAR HYPERTROPHY ~ prominent R or R' w/ RAD or RAA ANTEROLATERAL INFARCT, AGE INDETERMINATE ~ Q >79mS & >0.57mV in V3-V6 Vent. rate 83 BPM PR interval 160 ms QRS duration 96 ms QT/QTc 376/442 ms P-R-T axes 62 199 64  Radiology/Studies: Dg Chest Portable 1 View 07/31/2012  *RADIOLOGY REPORT*  Clinical Data: Chest pain tonight.  PORTABLE CHEST - 1 VIEW  Comparison: 07/24/2012  Findings: External defibrillator projected over the mid chest. Shallow inspiration.  Heart size and pulmonary vascularity are normal for technique.  No focal airspace disease or consolidation in the  lungs.  No blunting of costophrenic angles.  No pneumothorax.  No significant change since previous study.  IMPRESSION: No evidence of active pulmonary disease.   Original Report Authenticated By: Burman Nieves, M.D.     Current Medications:  . aspirin  81 mg Oral Daily  . atorvastatin  80 mg Oral q1800  . carvedilol  12.5 mg Oral BID WC  . nortriptyline  10 mg Oral QHS  . pantoprazole  40 mg Oral Q breakfast  . prasugrel  10 mg Oral Daily  . sodium chloride  3 mL Intravenous Q12H   . heparin 1,000 Units/hr (07/31/12 0505)    ASSESSMENT AND PLAN: Principal Problem:   Unstable angina - Unclear source as initial cath showed LAD 100% (stented) and 40% CFX; re-look showed RI 70%. ECG and ez not consistent with stent closure. Pain-free on current Rx. Continue to cycle ez, if 10am troponin is increased, may  need cath, otherwise, MD advise on further testing. NPO for now.  Otherwise, continue home Rx. Active Problems:   Coronary artery disease   History of acute anterior wall myocardial infarction   Obesity (BMI 30-39.9)   Signed, Theodore Demark , PA-C 8:08 AM 07/31/2012  Patient seen, examined. Available data reviewed. Agree with findings, assessment, and plan as outlined by Theodore Demark, PA-C. The patient is well-known to me from her recent admission with an anterior MI. She has already had a relook cath for recurrent CP, showing wide patency at her stent site. I suspect the mildly elevated troponin is residual from her initial infarct, but will cycle another set of enzymes now. If flat or trending down, would not pursue further eval as her CP is atypical. Will follow up after troponin result is back. Pt examined and heart is RRR without murmur or gallop, legs have no edema, and lungs are clear. All other diagnostic data was reviewed.  Tonny Bollman, M.D. 07/31/2012 11:42 AM

## 2012-07-31 NOTE — ED Notes (Addendum)
Pt brought to ED via [facility] for evaluation of chest pain.  Pt was discharged from hospital a week ago with an MI, went to cath lab twice upon admission- cardiac arrest with last admission.  Pt began having midsternal CP tonight, took 1 nitro and 325 ASA with relief.  Pt asymptomatic at present and denies any pain.

## 2012-07-31 NOTE — ED Notes (Addendum)
Patient presents with c/o acute onset of nonradiating, substernal CP. Described as burning. 8/10 at its worst. Episode lasted less than 1 minute. Took ASA 324 mg & Nitro x 1. Pain subsided. No SOB, headache or diaphoresis. No additional CP since resolution.

## 2012-07-31 NOTE — ED Provider Notes (Signed)
History     CSN: [id number]  Arrival date & time 07/31/12  0204   First MD Initiated Contact with Patient 07/31/12 0206      Chief Complaint  Patient presents with  . Chest Pain    (Consider location/radiation/quality/duration/timing/severity/associated sxs/prior treatment) Patient is a 62 y.o. female presenting with chest pain. The history is provided by the patient and a relative. No language interpreter was used.  Chest Pain Chest pain location: midsternal. Pain quality: sharp   Pain radiates to:  Does not radiate Pain radiates to the back: no   Pain severity:  Severe Duration:  10 minutes Timing:  Constant Progression:  Resolved Chronicity:  New Context comment:  Patient had just got up to get pain meds and cough meds. and was getting back in bed.  Relieved by:  Nitroglycerin Associated symptoms: no palpitations and no shortness of breath   62yo female with midsternal chest pain that is sharp lasted for 10 minutes after getting out of bed to get her medications tonight.  The pain subsided shortly after taking 1 ntg and an aspirin.  Patient presented on Jul 21, 2012 with cardiac arrest with v fib and was taken to the cath lab by Dr. Excell Seltzer where he put a stent to her LAD.  Patient is wearing an external defibulator today.  Denies SOB, vomiting.  Patient went back to the cath lab the same day for similar pain with Dr. [place] and the second cath was unremarkable. States that the pain today is similar to the pain she experienced that day.  She is taking oxy IR for musculoskeletal pain from CPR on the 14th.  pmh hypertension, sleep apnea, asthma, gerd systolic chf.    Past Medical History  Diagnosis Date  . Asthma   . GERD (gastroesophageal reflux disease)   . Sleep apnea     STOPBANG=4  . Hypertension   . 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 Barrett's esophagus with esophagitis 11/01/2011    Patient states no evidence of barrett's from last  surveillance EGD on 11/2010  . Stress bladder incontinence, female 11/01/2011  . 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 Complex regional pain syndrome of lower limb   . Coronary artery disease     STEMI 07/2012 Cath totally occluded prox LAD s/p DES, otherwise nonobstructive dz in RCA and LCx, EF 30%  . Systolic CHF     Echo 07/22/12 EF 30-35%, grade 1 diastolic dysfunction, akinesis of the mid-distalanterior and apical myocardium  . Cardiac arrest     V.Fib arrest 07/2012 in the setting of acute anterior STEMI; Discharged with Lifevest  . Hyperlipidemia   . Bilateral hand numbness     Past Surgical History  Procedure Laterality Date  . Cholecystectomy  nov 2011  . Appendectomy  as teenager  . Tonsillectomy  as teenager  . Abdominal hysterectomy  1999    complete  . Both knees arthroscopic sugery last done 2007    . Total knee arthroplasty  10/29/2011    Procedure: TOTAL KNEE ARTHROPLASTY;  Surgeon: Eugenia Mcalpine, MD;  Location: [facility];  Service: Orthopedics;  Laterality: Right;  . Coronary angioplasty with stent placement Left 07/21/2012    LAD x 1     No family history on file.  History  Substance Use Topics  . Smoking status: Never Smoker   . Smokeless tobacco: Never Used  . Alcohol Use: No    OB History  Grav Para Term Preterm Abortions TAB SAB Ect Mult Living                  Review of Systems  Constitutional: Negative.   HENT: Negative.   Eyes: Negative.   Respiratory: Negative.  Negative for shortness of breath and wheezing.   Cardiovascular: Positive for chest pain. Negative for palpitations and leg swelling.  Gastrointestinal: Negative.   Neurological: Negative.   Psychiatric/Behavioral: Negative.   All other systems reviewed and are negative.    Allergies  Dilaudid; Vicodin; and Zofran  Home Medications   Current Outpatient Rx  Name  Route  Sig  Dispense  Refill  . albuterol (PROVENTIL HFA;VENTOLIN HFA) 108 (90 BASE) MCG/ACT inhaler   Inhalation   Inhale  2 puffs into the lungs every 4 (four) hours as needed for wheezing or shortness of breath.         Marland Kitchen albuterol (PROVENTIL) (2.5 MG/3ML) 0.083% nebulizer solution   Nebulization   Take 2.5 mg by nebulization every 4 (four) hours as needed for wheezing or shortness of breath.         Marland Kitchen aspirin 81 MG chewable tablet   Oral   Chew 1 tablet (81 mg total) by mouth daily.         Marland Kitchen atorvastatin (LIPITOR) 80 MG tablet   Oral   Take 1 tablet (80 mg total) by mouth daily at 6 PM.   30 tablet   6   . carvedilol (COREG) 12.5 MG tablet   Oral   Take 1 tablet (12.5 mg total) by mouth 2 (two) times daily with a meal.   60 tablet   6   . chlorpheniramine-HYDROcodone (TUSSIONEX) 10-8 MG/5ML LQCR   Oral   Take 5 mLs by mouth every 8 (eight) hours as needed.   140 mL   0   . nitroGLYCERIN (NITROSTAT) 0.4 MG SL tablet   Sublingual   Place 1 tablet (0.4 mg total) under the tongue every 5 (five) minutes as needed for chest pain.   25 tablet   3   . nortriptyline (PAMELOR) 10 MG capsule   Oral   Take 10 mg by mouth at bedtime.         Marland Kitchen oxyCODONE (OXY IR/ROXICODONE) 5 MG immediate release tablet   Oral   Take 5 mg by mouth every 6 (six) hours as needed for pain.         . pantoprazole (PROTONIX) 40 MG tablet   Oral   Take 40 mg by mouth daily with breakfast.          . prasugrel (EFFIENT) 10 MG TABS   Oral   Take 1 tablet (10 mg total) by mouth daily.   30 tablet   6   . VITAMIN B COMPLEX-C PO   Oral   Take 1 tablet by mouth daily.           BP 97/61  Temp(Src) 98.4 F (36.9 C) (Oral)  Resp 23  SpO2 95%  Physical Exam  Nursing note and vitals reviewed. Constitutional: She is oriented to person, place, and time. She appears well-developed and well-nourished.  HENT:  Head: Normocephalic and atraumatic.  Eyes: Conjunctivae and EOM are normal. Pupils are equal, round, and reactive to light.  Neck: Normal range of motion. Neck supple.  Cardiovascular: Normal  rate, regular rhythm and normal heart sounds.   No murmur heard. Pulmonary/Chest: Effort normal and breath sounds normal. No respiratory distress. She has no  wheezes.  Abdominal: Soft. Bowel sounds are normal. She exhibits no distension.  Musculoskeletal: Normal range of motion. She exhibits no edema and no tenderness.  Neurological: She is alert and oriented to person, place, and time. She has normal reflexes.  Skin: Skin is warm and dry.  Psychiatric: She has a normal mood and affect.    ED Course  Procedures (including critical care time) Patient remains pain free.  Discussed with Dr. Dierdre Highman.  Will call [place] to get admitted. 2:30 Lab notified to draw blood now.   3pm Still waiting on troponin results.  Patient remains pain free  3:50am patient remains pain free.  Trop . 90. . Dr. Donnie Aho coming in to see patient.  Labs Reviewed  CBC  BASIC METABOLIC PANEL   No results found.   No diagnosis found.    MDM  62yo female with midsternal cp x 10 minuties relieved with ntg with recent cardiac arrest.  External defibulator in place.  Elevated troponin .90.  Chest x-ray unremarkable.  hgb 9.7. Pain free presently.  She will be admitted by Dr. Donnie Aho to tele bed Labs Reviewed  CBC - Abnormal; Notable for the following:    RBC 3.43 (*)    Hemoglobin 9.7 (*)    HCT 28.9 (*)    All other components within normal limits  BASIC METABOLIC PANEL - Abnormal; Notable for the following:    Glucose, Bld 120 (*)    GFR calc non Af Amer 73 (*)    GFR calc Af Amer 85 (*)    All other components within normal limits  TROPONIN I - Abnormal; Notable for the following:    Troponin I 0.85 (*)    All other components within normal limits  COMPREHENSIVE METABOLIC PANEL - Abnormal; Notable for the following:    Glucose, Bld 122 (*)    Total Protein 8.4 (*)    GFR calc non Af Amer 74 (*)    GFR calc Af Amer 86 (*)    All other components within normal limits  TROPONIN I - Abnormal; Notable for  the following:    Troponin I 0.93 (*)    All other components within normal limits  POCT I-STAT TROPONIN I - Abnormal; Notable for the following:    Troponin i, poc 0.90 (*)    All other components within normal limits  MRSA PCR SCREENING  LIPID PANEL  PROTIME-INR  TSH  HEMOGLOBIN A1C  TROPONIN I  TROPONIN I  HEPARIN LEVEL (UNFRACTIONATED)     Date: 07/31/2012  Rate: 83  Rhythm: normal sinus rhythm  QRS Axis: normal  Intervals: normal  ST/T Wave abnormalities: ST elevations anteriorly  Conduction Disutrbances:none  Narrative Interpretation: Old ST elevation in lesds 2,3 avf (recent MI)  Old EKG Reviewed: unchanged       Remi Haggard, NP 07/31/12 1610  Remi Haggard, NP 07/31/12 [phone]

## 2012-07-31 NOTE — Progress Notes (Signed)
ANTICOAGULATION CONSULT NOTE - Follow-up Consult  Pharmacy Consult for heparin Indication: chest pain/ACS  Allergies  Allergen Reactions  . Dilaudid (Hydromorphone Hcl) Nausea And Vomiting  . Vicodin (Hydrocodone-Acetaminophen) Nausea And Vomiting  . Zofran Nausea And Vomiting    Patient Measurements: Height: 5' 1.42" (156 cm) Weight: 227 lb 15.3 oz (103.4 kg) (per 07/27/12 documentation) IBW/kg (Calculated) : 48.76 Heparin Dosing Weight: 74 kg  Vital Signs: Temp: 98.1 F (36.7 C) (02/24 1145) Temp src: Oral (02/24 1145) BP: 97/69 mmHg (02/24 1200) Pulse Rate: 90 (02/24 1145)  Labs:  Recent Labs  07/31/12 0233 07/31/12 0314 07/31/12 0405 07/31/12 0410 07/31/12 0700 07/31/12 1230  HGB 9.7*  --   --   --   --   --   HCT 28.9*  --   --   --   --   --   PLT 265  --   --   --   --   --   LABPROT  --   --   --   --  13.9  --   INR  --   --   --   --  1.08  --   HEPARINUNFRC  --   --   --   --   --  0.23*  CREATININE 0.85  --  0.84  --   --   --   TROPONINI  --  0.85*  --  0.93*  --   --     Estimated Creatinine Clearance: 79.4 ml/min (by C-G formula based on Cr of 0.84).   Medical History: Past Medical History  Diagnosis Date  . Asthma   . GERD (gastroesophageal reflux disease)   . Hypertension   . 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 Barrett's esophagus with esophagitis 11/01/2011    Patient states no evidence of barrett's from last surveillance EGD on 11/2010  . Stress bladder incontinence, female 11/01/2011  . 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 Complex regional pain syndrome of lower limb   . Coronary artery disease     STEMI 07/2012 Cath totally occluded prox LAD s/p DES, otherwise nonobstructive dz in RCA and LCx, EF 30%  . Systolic CHF     Echo 07/22/12 EF 30-35%, grade 1 diastolic dysfunction, akinesis of the mid-distalanterior and apical myocardium  . Cardiac arrest     V.Fib arrest 07/2012 in the setting of acute anterior STEMI;  Discharged with Life[facility]  . Hyperlipidemia     Medications:  Scheduled:  . aspirin  81 mg Oral Daily  . [COMPLETED] aspirin  325 mg Oral Once  . atorvastatin  80 mg Oral q1800  . carvedilol  12.5 mg Oral BID WC  . [COMPLETED] heparin  4,000 Units Intravenous Once  . nortriptyline  10 mg Oral QHS  . pantoprazole  40 mg Oral Q breakfast  . prasugrel  10 mg Oral Daily  . sodium chloride  3 mL Intravenous Q12H  . [DISCONTINUED] aspirin  324 mg Oral Once  . [DISCONTINUED] aspirin  325 mg Oral Once  . [DISCONTINUED] heparin  4,000 Units Intravenous Once    Assessment: 62 yo female admitted with chest pain. Started on IV heparin at 1000 units/hr.  Initial heparin level subtherapeutic at 0.23.  No bleeding or complications noted per chart notes.  Goal of Therapy:  Heparin level 0.3-0.7 units/ml Monitor platelets by anticoagulation protocol: Yes   Plan:  1. Increase IV heparin to 1250 units/hr.  2. Check heparin level in 6 hours.  3. F/U plans. 4. Daily heparin level and CBC.  Gardner Candle 07/31/2012,1:33 PM

## 2012-08-01 DIAGNOSIS — R079 Chest pain, unspecified: Secondary | ICD-10-CM

## 2012-08-01 LAB — CBC
HCT: 29.2 % — ABNORMAL LOW (ref 36.0–46.0)
Hemoglobin: 9.7 g/dL — ABNORMAL LOW (ref 12.0–15.0)
MCV: 84.9 fL (ref 78.0–100.0)
Platelets: 262 10*3/uL (ref 150–400)
RBC: 3.44 MIL/uL — ABNORMAL LOW (ref 3.87–5.11)
WBC: 5.6 10*3/uL (ref 4.0–10.5)

## 2012-08-01 NOTE — Discharge Summary (Signed)
Discharge Summary   Patient ID: Wendy Reid MRN: [id number], DOB/AGE: 11-21-1950 62 y.o.  Primary MD: Rosario Adie, MD Primary Cardiologist: Dr. Excell Seltzer Admit date: 07/31/2012 D/C date:     08/01/2012      Primary Discharge Diagnoses:  1. Atypical Chest Pain  - No evidence of acute ischemic process  - Troponins trending down after recent MI  2. Chronic Systolic CHF, EF 40-98%  - Volume status remained stable  - Continued LifeVest for prevention of SCD  - Blood pressure has limited medical therapy  Secondary Discharge Diagnoses:  . Asthma   . GERD (gastroesophageal reflux disease)   . Hypertension   . 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 Barrett's esophagus with esophagitis 11/01/2011    Patient states no evidence of barrett's from last surveillance EGD on 11/2010  . Stress bladder incontinence, female 11/01/2011  . 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 Complex regional pain syndrome of lower limb   . Coronary artery disease     STEMI 07/2012 Cath totally occluded prox LAD s/p DES, otherwise nonobstructive dz in RCA and LCx, EF 30%  . Systolic CHF     Echo 07/22/12 EF 30-35%, grade 1 diastolic dysfunction, akinesis of the mid-distalanterior and apical myocardium  . Cardiac arrest     V.Fib arrest 07/2012 in the setting of acute anterior STEMI; Discharged with [facility]  . Hyperlipidemia       Allergies Allergies  Allergen Reactions  . Dilaudid (Hydromorphone Hcl) Nausea And Vomiting  . Vicodin (Hydrocodone-Acetaminophen) Nausea And Vomiting  . Zofran Nausea And Vomiting    Diagnostic Studies/Procedures:  None  History of Present Illness: 62 y.o. female w/ recent acute STEMI and subsequent cardiac arrest who was discharged on 2/20, presented to [facility] on 07/31/12 with complaints of chest pain. She had an acute anterior infarction on 07/21/12 complicated by cardiac arrest requiring ACLS. She had a DES placed in the LAD and had  reduced systolic function. She was discharged home on the 19th with a lifevest. On day of presentation she complained of left chest pain for which she took an oxycodone, but then developed burning anterior chest discomfort that was similar to her discomfort prior to her infarction. She took one nitroglycerin and called EMS.  Hospital Course: In the ED, EKG revealed an evolving anterior infarction. CXR was without acute cardiopulmonary abnormalities. Labs were significant for troponin 0.85, Hgb 9.7, otherwise unremarkable CMET/CBC. She was placed on Heparin drip and admitted for further evaluation and treatment. Cardiac enzymes were cycled and trended down. It was felt the mildly elevated troponin was residual from her initial infarct. Heparin was stopped and she was able to ambulate in the halls without difficulty. She was seen and evaluated by Dr. Excell Seltzer who felt she was stable for discharge home with plans for follow up as scheduled below.  Discharge Vitals: Blood pressure 118/66, pulse 88, temperature 98.3 F (36.8 C), temperature source Oral, resp. rate 20, height 5' 1.42" (1.56 m), weight 227 lb 15.3 oz (103.4 kg), SpO2 94.00%.  Labs: Component Value Date   WBC 5.6 08/01/2012   HGB 9.7* 08/01/2012   HCT 29.2* 08/01/2012   MCV 84.9 08/01/2012   PLT 262 08/01/2012   Lab 07/31/12 0405  NA 136  K 3.6  CL 102  CO2 24  BUN 9  CREATININE 0.84  CALCIUM 9.5  PROT 8.4*  BILITOT 0.6  ALKPHOS 88  ALT 26  AST 25  GLUCOSE 122*    07/31/12 0314 07/31/12 0410 07/31/12 1230  TROPONINI 0.85* 0.93* 0.79*   Component Value Date   CHOL 145 07/31/2012   HDL 45 07/31/2012   LDLCALC 85 07/31/2012   TRIG 75 07/31/2012      07/31/2012 04:05  TSH 2.109      07/31/2012 04:05  Hemoglobin A1C 5.5    Discharge Medications     Medication List    ASK your doctor about these medications       albuterol (2.5 MG/3ML) 0.083% nebulizer solution  Commonly known as:  PROVENTIL  Take 2.5 mg by  nebulization every 4 (four) hours as needed for wheezing or shortness of breath.     albuterol 108 (90 BASE) MCG/ACT inhaler  Commonly known as:  PROVENTIL HFA;VENTOLIN HFA  Inhale 2 puffs into the lungs every 4 (four) hours as needed for wheezing or shortness of breath.     aspirin 81 MG chewable tablet  Chew 1 tablet (81 mg total) by mouth daily.     aspirin 325 MG tablet  Take 325 mg by mouth once.     atorvastatin 80 MG tablet  Commonly known as:  LIPITOR  Take 1 tablet (80 mg total) by mouth daily at 6 PM.     carvedilol 12.5 MG tablet  Commonly known as:  COREG  Take 1 tablet (12.5 mg total) by mouth 2 (two) times daily with a meal.     chlorpheniramine-HYDROcodone 10-8 MG/5ML Lqcr  Commonly known as:  TUSSIONEX  Take 5 mLs by mouth every 8 (eight) hours as needed.     nitroGLYCERIN 0.4 MG SL tablet  Commonly known as:  NITROSTAT  Place 1 tablet (0.4 mg total) under the tongue every 5 (five) minutes as needed for chest pain.     nortriptyline 10 MG capsule  Commonly known as:  PAMELOR  Take 10 mg by mouth at bedtime.     oxyCODONE 5 MG immediate release tablet  Commonly known as:  Oxy IR/ROXICODONE  Take 5 mg by mouth every 6 (six) hours as needed for pain.     pantoprazole 40 MG tablet  Commonly known as:  PROTONIX  Take 40 mg by mouth daily with breakfast.     prasugrel 10 MG Tabs  Commonly known as:  EFFIENT  Take 1 tablet (10 mg total) by mouth daily.     VITAMIN B COMPLEX-C PO  Take 1 tablet by mouth daily.     ZOCOR PO  Take by mouth.        Disposition    Future Appointments Provider Department Dept Phone   08/04/2012 11:10 AM Beatrice Lecher, PA [facility] Main Office [place]) [phone]     Follow-up Information   Follow up with Tereso Newcomer, PA On 08/04/2012. (11:10)    Contact information:   [facility] [address] [place] [address] [phone]        Outstanding Labs/Studies:  None  Duration of  Discharge Encounter: Greater than 30 minutes including physician and PA time.  Signed, HOPE, JESSICA PA-C 08/01/2012, 11:31 AM

## 2012-08-01 NOTE — Progress Notes (Signed)
Discharged to home accompanied by son and a volunteer via wheelchair. Discharge instruction given. PIV removed and disconnected from telemetry. All belonging taken.

## 2012-08-01 NOTE — Progress Notes (Signed)
    Subjective:  Still with episodic chest pain, some chest burning, and sharp pains in the right chest. Also has had episodic shortness of breath. Overall she feels better.  Objective:  Vital Signs in the last 24 hours: Temp:  [97.9 F (36.6 C)-98.7 F (37.1 C)] 98.3 F (36.8 C) (02/25 0725) Pulse Rate:  [80-98] 88 (02/25 0725) Resp:  [18-20] 20 (02/25 0400) BP: (87-125)/(53-83) 118/66 mmHg (02/25 0725) SpO2:  [93 %-99 %] 94 % (02/25 0840)  Intake/Output from previous day: 02/24 0701 - 02/25 0700 In: 400.5 [P.O.:120; I.V.:280.5] Out: -   Physical Exam: Pt is alert and oriented, anxious woman in NAD HEENT: normal Neck: JVP - normal Lungs: CTA bilaterally CV: RRR without murmur or gallop Abd: soft, NT, obese Ext: no C/C/E, distal pulses intact and equal Skin: warm/dry no rash  Lab Results:  Recent Labs  07/31/12 0233 08/01/12 0500  WBC 6.3 5.6  HGB 9.7* 9.7*  PLT 265 262    Recent Labs  07/31/12 0233 07/31/12 0405  NA 135 136  K 3.7 3.6  CL 101 102  CO2 24 24  GLUCOSE 120* 122*  BUN 9 9  CREATININE 0.85 0.84    Recent Labs  07/31/12 0410 07/31/12 1230  TROPONINI 0.93* 0.79*   Tele: Sinus rhythm with PVCs  Assessment/Plan:  1. Chest pain, atypical. There is no evidence of ongoing ischemic process. The patient's troponins are mildly elevated but they are clearly trending down from her recent myocardial infarction. I am going to stop her heparin, mobilize her, and plan discharge later today. She has already undergone relook cardiac catheterization that showed wide patency of her stent site.  2. Chronic systolic heart failure. Blood pressure has been low and this limits medical therapy at the present time. She is wearing a life vest because of severely depressed LV function after her anterior MI. She should continue on carvedilol. She will require close clinical followup and should be seen in the office for followup next week.  Tonny Bollman,  M.D. 08/01/2012, 9:54 AM

## 2012-08-01 NOTE — Progress Notes (Signed)
ANTICOAGULATION CONSULT NOTE  Pharmacy Consult for heparin Indication: chest pain/ACS  Allergies  Allergen Reactions  . Dilaudid (Hydromorphone Hcl) Nausea And Vomiting  . Vicodin (Hydrocodone-Acetaminophen) Nausea And Vomiting  . Zofran Nausea And Vomiting    Patient Measurements: Height: 5' 1.42" (156 cm) Weight: 227 lb 15.3 oz (103.4 kg) (per 07/27/12 documentation) IBW/kg (Calculated) : 48.76 Heparin Dosing Weight: 74 kg  Vital Signs: Temp: 98.3 F (36.8 C) (02/25 0400) Temp src: Oral (02/25 0400) BP: 100/67 mmHg (02/25 0400) Pulse Rate: 80 (02/25 0400)  Labs:  Recent Labs  07/31/12 0233 07/31/12 0314 07/31/12 0405 07/31/12 0410 07/31/12 0700 07/31/12 1230 07/31/12 2049 08/01/12 0500  HGB 9.7*  --   --   --   --   --   --  9.7*  HCT 28.9*  --   --   --   --   --   --  29.2*  PLT 265  --   --   --   --   --   --  262  LABPROT  --   --   --   --  13.9  --   --   --   INR  --   --   --   --  1.08  --   --   --   HEPARINUNFRC  --   --   --   --   --  0.23* 0.48 <0.10*  CREATININE 0.85  --  0.84  --   --   --   --   --   TROPONINI  --  0.85*  --  0.93*  --  0.79*  --   --     Estimated Creatinine Clearance: 79.4 ml/min (by C-G formula based on Cr of 0.84).  Assessment: 62 yo female with chest pain for heparin.  Infusing appropriately per RN  Goal of Therapy:  Heparin level 0.3-0.7 units/ml Monitor platelets by anticoagulation protocol: Yes   Plan:  Increase Heparin 1350 units/hr Check heparin level in 6 hours.  Kinslee Dalpe, Gary Fleet 08/01/2012,6:59 AM

## 2012-08-02 ENCOUNTER — Telehealth: Payer: Self-pay | Admitting: Cardiovascular Disease

## 2012-08-02 ENCOUNTER — Other Ambulatory Visit: Payer: PRIVATE HEALTH INSURANCE

## 2012-08-02 ENCOUNTER — Encounter: Payer: PRIVATE HEALTH INSURANCE | Admitting: Physician Assistant

## 2012-08-02 NOTE — Telephone Encounter (Signed)
New Prob    Pt stated she just got discharged from the hospital and was seen by Dr. Excell Seltzer. Dr. Excell Seltzer stated he needs to follow up with her next week but he does not have availability until late April. She originally had a est post hosp visit with Lorin Picket, but she asked me to cancel that appt.

## 2012-08-02 NOTE — Telephone Encounter (Signed)
Per patient Dr Excell Seltzer wanted to see her personally. Advised would forward message to Lauren RN with Dr Excell Seltzer and she would call her back.

## 2012-08-04 ENCOUNTER — Encounter: Payer: PRIVATE HEALTH INSURANCE | Admitting: Physician Assistant

## 2012-08-04 NOTE — Telephone Encounter (Signed)
She needs to see Lorin Picket and then I will see her long-term. Close follow-up is important for her and I am unable to get her in next week.

## 2012-08-07 ENCOUNTER — Encounter: Payer: Self-pay | Admitting: Physician Assistant

## 2012-08-07 ENCOUNTER — Ambulatory Visit (INDEPENDENT_AMBULATORY_CARE_PROVIDER_SITE_OTHER): Payer: PRIVATE HEALTH INSURANCE | Admitting: Physician Assistant

## 2012-08-07 VITALS — BP 108/72 | HR 83 | Ht 61.5 in | Wt 225.1 lb

## 2012-08-07 DIAGNOSIS — I2510 Atherosclerotic heart disease of native coronary artery without angina pectoris: Secondary | ICD-10-CM

## 2012-08-07 DIAGNOSIS — I2589 Other forms of chronic ischemic heart disease: Secondary | ICD-10-CM

## 2012-08-07 DIAGNOSIS — E785 Hyperlipidemia, unspecified: Secondary | ICD-10-CM

## 2012-08-07 DIAGNOSIS — I2109 ST elevation (STEMI) myocardial infarction involving other coronary artery of anterior wall: Secondary | ICD-10-CM

## 2012-08-07 MED ORDER — LISINOPRIL 2.5 MG PO TABS: 1.2500 mg | ORAL_TABLET | Freq: Every day | ORAL | Status: DC

## 2012-08-07 NOTE — Patient Instructions (Addendum)
START LISINOPRIL 2.5 MG TABLET; YOU WILL TAKE 1/2 (HALF) TABLET DAILY  1 WEEK LAB; BMET  You have been referred to CARDIAC REHAB TO BE DONE AT [facility]; DX ANTERIOR STEMI, CAD, HLD, ISCHEMIC HEART DISEASE  PLEASE FOLLOW UP WITH DR. Excell Seltzer IN 4 WEEKS WITH FASTING LIPID AND LIVER PANEL SAME DAY OR DAY BEFORE

## 2012-08-07 NOTE — Progress Notes (Signed)
[address], [address] [place], [place]  [address] Phone: [phone], Fax:  [phone]  Date:  08/07/2012   ID:  Wendy, Reid 1950-07-04, MRN [id number]  PCP:  Rosario Adie, MD  Primary Cardiologist:  Dr. Tonny Bollman     History of Present Illness: Wendy Reid is a 62 y.o. female who returns for follow up after a recent admission to the hospital for an acute anterior STEMI complicated by VF arrest and acute systolic CHF secondary to ischemic cardiomyopathy with EF 30-35% as well as Proteus mirabilis UTI.  She was admitted 2/14-2/20.  She initially presented to [facility] ED with CP. She had anterior ST elevation on ECG and developed VT/VF while awaiting transport to [facility].  She was defibrillated and intubated.  Emergent LHC 07/21/12:  pLAD 100%, pCFX 30-40%, ant and apical AK, EF 30%.  PCI:  Promus Premier DES to pLAD.  She had recurrent pain post cath/PCI and relook cath demonstrated patent LAD stent.  Low BPs limited initiation/titration of CHF medications.  Echo 07/22/12:  Focal basal hypertrophy, EF 30-35%, ant and apical AK, Gr 1 diast dysfn, trivial AI.  She was discharged on a LifeVest for prevention of SCD with plans to repeat an Echo in 3 mos to determine +/- ICD.  She was readmitted 2/24-2/25 with chest pain.  Troponins were elevated but down-trending (from prior MI).  No further evaluation was necessary.    Since discharge, she continues to note chest discomfort. This seems to be improving. She denies any symptoms like her myocardial infarction. She denies significant dyspnea. She has been walking without difficulty. She denies orthopnea, PND or edema. She denies syncope. She is wearing her LifeVest.  Labs (2/14):  K 3.6, creatinine 0.84, ALT 26, LDL 85, Hgb 9.7 (MCV 84.9), TSH 2.109  Wt Readings from Last 3 Encounters:  08/07/12 225 lb 1.9 oz (102.114 kg)  07/31/12 227 lb 15.3 oz (103.4 kg)  07/27/12 228 lb (103.42 kg)     Past Medical History    Diagnosis Date  . Asthma   . GERD (gastroesophageal reflux disease)   . Hypertension   . 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 Barrett's esophagus with esophagitis 11/01/2011    Patient states no evidence of barrett's from last surveillance EGD on 11/2010  . Stress bladder incontinence, female 11/01/2011  . 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 Complex regional pain syndrome of lower limb   . Coronary artery disease     STEMI 07/2012 Cath totally occluded prox LAD s/p DES, otherwise nonobstructive dz in RCA and LCx, EF 30%  . Systolic CHF     Echo 07/22/12 EF 30-35%, grade 1 diastolic dysfunction, akinesis of the mid-distalanterior and apical myocardium  . Cardiac arrest     V.Fib arrest 07/2012 in the setting of acute anterior STEMI; Discharged with Lifevest  . Hyperlipidemia     Current Outpatient Prescriptions  Medication Sig Dispense Refill  . albuterol (PROVENTIL HFA;VENTOLIN HFA) 108 (90 BASE) MCG/ACT inhaler Inhale 2 puffs into the lungs every 4 (four) hours as needed for wheezing or shortness of breath.      Marland Kitchen albuterol (PROVENTIL) (2.5 MG/3ML) 0.083% nebulizer solution Take 2.5 mg by nebulization every 4 (four) hours as needed for wheezing or shortness of breath.      Marland Kitchen aspirin 81 MG chewable tablet Chew 1 tablet (81 mg total) by mouth daily.      Marland Kitchen  atorvastatin (LIPITOR) 80 MG tablet Take 1 tablet (80 mg total) by mouth daily at 6 PM.  30 tablet  6  . carvedilol (COREG) 12.5 MG tablet Take 1 tablet (12.5 mg total) by mouth 2 (two) times daily with a meal.  60 tablet  6  . chlorpheniramine-HYDROcodone (TUSSIONEX) 10-8 MG/5ML LQCR Take 5 mLs by mouth every 8 (eight) hours as needed.  140 mL  0  . nitroGLYCERIN (NITROSTAT) 0.4 MG SL tablet Place 1 tablet (0.4 mg total) under the tongue every 5 (five) minutes as needed for chest pain.  25 tablet  3  . oxyCODONE (OXY IR/ROXICODONE) 5 MG immediate release tablet Take 5 mg by mouth every 6 (six) hours as needed for  pain.      . pantoprazole (PROTONIX) 40 MG tablet Take 40 mg by mouth daily with breakfast.       . prasugrel (EFFIENT) 10 MG TABS Take 1 tablet (10 mg total) by mouth daily.  30 tablet  6  . VITAMIN B COMPLEX-C PO Take 1 tablet by mouth daily.      . nortriptyline (PAMELOR) 10 MG capsule Take 10 mg by mouth at bedtime.       No current facility-administered medications for this visit.    Allergies:    Allergies  Allergen Reactions  . Dilaudid (Hydromorphone Hcl) Nausea And Vomiting  . Vicodin (Hydrocodone-Acetaminophen) Nausea And Vomiting  . Zofran Nausea And Vomiting    Social History:  The patient  reports that she has never smoked. She has never used smokeless tobacco. She reports that she does not drink alcohol or use illicit drugs.   ROS:  Please see the history of present illness.      All other systems reviewed and negative.   PHYSICAL EXAM: VS:  BP 108/72  Pulse 83  Ht 5' 1.5" (1.562 m)  Wt 225 lb 1.9 oz (102.114 kg)  BMI 41.85 kg/m2 Well nourished, well developed, in no acute distress HEENT: normal Neck: no JVD Cardiac:  normal S1, S2; RRR; no murmur Lungs:  clear to auscultation bilaterally, no wheezing, rhonchi or rales Abd: soft, nontender, no hepatomegaly Ext: no edema; right wrist and right groin sites without hematoma or bruit Skin: warm and dry Neuro:  CNs 2-12 intact, no focal abnormalities noted  EKG:   NSR, HR 83, right axis deviation, poor R wave progression, evolving anterior MI, no change from prior tracing     ASSESSMENT AND PLAN:  1. Coronary Artery Disease:  She is stable after recent anterior STEMI complicated by V. Fib arrest treated with primary PCI to the LAD and with DES. No further workup for residual chest pain. We discussed the importance of dual antiplatelet therapy. She continues to wear her LifeVest.  I will set her up with cardiac rehabilitation referral. 2. Ischemic Cardiomyopathy: Doing well. Volume stable. She continues to wear  LifeVest. Continue current dose of carvedilol. We will try to place her on ACE inhibitor by starting lisinopril 2.5 mg one half tablet daily. Check a basic metabolic panel in one week. 3. Ventricular Fibrillation Arrest:  Continue her life vest. She currently cannot drive. Plan repeat echocardiogram 3 months post PCI to determine whether or not she needs ICD. 4. Hyperlipidemia: Check lipids and LFTs in about 4 weeks. 5. Disposition:  Follow up with Dr. Excell Seltzer in 4 weeks.  Signed, Tereso Newcomer, PA-C  2:40 PM 08/07/2012

## 2012-08-07 NOTE — Telephone Encounter (Signed)
I spoke with the pt to reschedule her post hospital appointment. I attempted to bring the pt in today for an appointment but she cannot come today.  I have scheduled the pt for follow-up on 08/09/12.

## 2012-08-09 ENCOUNTER — Encounter: Payer: PRIVATE HEALTH INSURANCE | Admitting: Physician Assistant

## 2012-08-09 ENCOUNTER — Ambulatory Visit: Payer: PRIVATE HEALTH INSURANCE | Admitting: Physician Assistant

## 2012-08-16 ENCOUNTER — Other Ambulatory Visit (INDEPENDENT_AMBULATORY_CARE_PROVIDER_SITE_OTHER): Payer: PRIVATE HEALTH INSURANCE

## 2012-08-16 ENCOUNTER — Telehealth: Payer: Self-pay | Admitting: Cardiovascular Disease

## 2012-08-16 DIAGNOSIS — I2589 Other forms of chronic ischemic heart disease: Secondary | ICD-10-CM

## 2012-08-16 DIAGNOSIS — E785 Hyperlipidemia, unspecified: Secondary | ICD-10-CM

## 2012-08-16 DIAGNOSIS — I2510 Atherosclerotic heart disease of native coronary artery without angina pectoris: Secondary | ICD-10-CM

## 2012-08-16 DIAGNOSIS — I2109 ST elevation (STEMI) myocardial infarction involving other coronary artery of anterior wall: Secondary | ICD-10-CM

## 2012-08-16 LAB — HEPATIC FUNCTION PANEL
Albumin: 3.8 g/dL (ref 3.5–5.2)
Alkaline Phosphatase: 89 U/L (ref 39–117)

## 2012-08-16 LAB — BASIC METABOLIC PANEL
BUN: 16 mg/dL (ref 6–23)
CO2: 27 mEq/L (ref 19–32)
Chloride: 108 mEq/L (ref 96–112)
Creatinine, Ser: 1 mg/dL (ref 0.4–1.2)

## 2012-08-16 LAB — LIPID PANEL
LDL Cholesterol: 55 mg/dL (ref 0–99)
Total CHOL/HDL Ratio: 3
Triglycerides: 105 mg/dL (ref 0.0–149.0)

## 2012-08-16 NOTE — Telephone Encounter (Signed)
Pt would like to have an excuse for her son . Her son Wendy Reid had to leave his work the two time that pt came to the hospital. Pt said her son's work place, needs a proof that pt was in the hospital dose two dates.

## 2012-08-16 NOTE — Telephone Encounter (Signed)
New problem    Need a note saying she was admit on the 2/14- 2/17 or  2/18.  Readmit on that sat or Sunday . For his son job.

## 2012-08-17 ENCOUNTER — Telehealth: Payer: Self-pay | Admitting: *Deleted

## 2012-08-17 NOTE — Telephone Encounter (Signed)
Message copied by Tarri Fuller on Thu Aug 17, 2012 10:56 AM ------      Message from: [place], [place] T      Created: Wed Aug 09, 2012  8:21 AM      Regarding: Nortriptyline       Okey Regal      Please tell JOHNANNA BAKKE that I reviewed Nortriptyline with Dr. Excell Seltzer.      We feel it is best for her to remain off of this medication and she can review with her orthopedist regarding other medications she can use for he regional pain syndrome.      Thanks      Tereso Newcomer, PA-C  8:22 AM 08/09/2012             ----- Message -----         From: Tonny Bollman, MD         Sent: 08/09/2012   1:21 AM           To: Beatrice Lecher, PA-C            Agreed. If I remember correctly she was taking if for some kind of regional pain syndrome and it wasn't helping much anyway. thx Lorin Picket      ----- Message -----         From: Beatrice Lecher, PA-C         Sent: 08/08/2012  10:19 AM           To: Tonny Bollman, MD            [name]      Almost forgot.  She was asking about nortriptyline.      I guess the neurologist told her to stop b/c she presented with VF.      The patient said she was waiting to hear what you thought.  She is not taking it.        I suppose she would be ok to take it b/c VF was in setting of AMI.  But, EF is down.  Nortriptyline can cause QT prolongation.  Might be better to side on caution and just not use it (at least for now).        What do you think?      Scott             ----- Message -----         From: Tonny Bollman, MD         Sent: 08/07/2012   4:27 PM           To: Beatrice Lecher, PA            thx Lorin Picket!      ----- Message -----         From: Beatrice Lecher, PA         Sent: 08/07/2012   4:14 PM           To: Tonny Bollman, MD                                     ------

## 2012-08-17 NOTE — Telephone Encounter (Signed)
lmom per Tereso Newcomer, PA and Dr. Excell Seltzer pt to stay off nortryptilne and if needing further pain meds for regional pain to contact orthopedist.

## 2012-08-18 NOTE — Telephone Encounter (Signed)
Letter completed and placed at the front desk for pick-up. Pt aware.

## 2012-08-21 ENCOUNTER — Telehealth: Payer: Self-pay | Admitting: *Deleted

## 2012-08-21 NOTE — Telephone Encounter (Signed)
lmom labs good, no changes to be made 

## 2012-08-21 NOTE — Telephone Encounter (Signed)
Message copied by Tarri Fuller on Mon Aug 21, 2012  9:07 AM ------      Message from: [place], [place] T      Created: Wed Aug 16, 2012  5:21 PM       Potassium and kidney function look good.      Lipids and LFTs look good.       Sugar mildly elevated.  This is not diagnostic of diabetes but she should get this re-checked with PCP.      Tereso Newcomer, PA-C  4:49 PM 04/20/2012 ------

## 2012-08-23 ENCOUNTER — Other Ambulatory Visit (INDEPENDENT_AMBULATORY_CARE_PROVIDER_SITE_OTHER): Payer: PRIVATE HEALTH INSURANCE

## 2012-08-23 ENCOUNTER — Ambulatory Visit (INDEPENDENT_AMBULATORY_CARE_PROVIDER_SITE_OTHER): Payer: PRIVATE HEALTH INSURANCE | Admitting: Physician Assistant

## 2012-08-23 ENCOUNTER — Encounter: Payer: Self-pay | Admitting: Physician Assistant

## 2012-08-23 ENCOUNTER — Telehealth: Payer: Self-pay | Admitting: *Deleted

## 2012-08-23 ENCOUNTER — Ambulatory Visit (INDEPENDENT_AMBULATORY_CARE_PROVIDER_SITE_OTHER)
Admission: RE | Admit: 2012-08-23 | Discharge: 2012-08-23 | Disposition: A | Discharge status: Home / Self Care | Payer: PRIVATE HEALTH INSURANCE | Source: Ambulatory Visit | Attending: Physician Assistant | Admitting: Physician Assistant

## 2012-08-23 VITALS — BP 118/72 | HR 89 | Ht 61.5 in | Wt 225.0 lb

## 2012-08-23 DIAGNOSIS — I2589 Other forms of chronic ischemic heart disease: Secondary | ICD-10-CM

## 2012-08-23 DIAGNOSIS — R05 Cough: Secondary | ICD-10-CM

## 2012-08-23 DIAGNOSIS — I2510 Atherosclerotic heart disease of native coronary artery without angina pectoris: Secondary | ICD-10-CM

## 2012-08-23 DIAGNOSIS — R0602 Shortness of breath: Secondary | ICD-10-CM

## 2012-08-23 DIAGNOSIS — R609 Edema, unspecified: Secondary | ICD-10-CM

## 2012-08-23 LAB — CBC WITH DIFFERENTIAL/PLATELET
Basophils Relative: 0.4 % (ref 0.0–3.0)
Eosinophils Absolute: 0.2 10*3/uL (ref 0.0–0.7)
HCT: 32.4 % — ABNORMAL LOW (ref 36.0–46.0)
Hemoglobin: 10.9 g/dL — ABNORMAL LOW (ref 12.0–15.0)
Lymphs Abs: 1.6 10*3/uL (ref 0.7–4.0)
MCHC: 33.6 g/dL (ref 30.0–36.0)
MCV: 85.4 fl (ref 78.0–100.0)
Monocytes Absolute: 0.3 10*3/uL (ref 0.1–1.0)
Neutro Abs: 3.2 10*3/uL (ref 1.4–7.7)
Neutrophils Relative %: 59.2 % (ref 43.0–77.0)
RBC: 3.79 Mil/uL — ABNORMAL LOW (ref 3.87–5.11)

## 2012-08-23 LAB — BASIC METABOLIC PANEL
CO2: 29 mEq/L (ref 19–32)
Chloride: 107 mEq/L (ref 96–112)
Creatinine, Ser: 0.9 mg/dL (ref 0.4–1.2)

## 2012-08-23 MED ORDER — FUROSEMIDE 20 MG PO TABS: 20.0000 mg | ORAL_TABLET | Freq: Every day | ORAL | Status: DC

## 2012-08-23 MED ORDER — HYDROCOD POLST-CHLORPHEN POLST 10-8 MG/5ML PO LQCR: 5.0000 mL | Freq: Three times a day (TID) | ORAL | Status: DC | PRN

## 2012-08-23 MED ORDER — ALLEGRA 180 MG PO TABS: 180.0000 mg | ORAL_TABLET | Freq: Every day | ORAL | Status: DC

## 2012-08-23 MED ORDER — FLUTICASONE PROPIONATE 50 MCG/ACT NA SUSP: 1.0000 | Freq: Every day | NASAL | Status: DC

## 2012-08-23 NOTE — Patient Instructions (Addendum)
KEEP APPT WITH DR. Excell Seltzer 09/06/12  LABS TODAY (BMET, CBC W.DIFF, BNP) WITH CXR AT THE [facility] ON [address].  YOU HAVE BEEN GIVEN AN RX FOR TUSSIONEX START FLONASE NASAL SPRAY 1 SPRAY EACH NOSTRIL ONE TIME DAILY START ALLEGRA 180 MG DAILY

## 2012-08-23 NOTE — Progress Notes (Signed)
[address], [address] [place], [place]  [address] Phone: [phone], Fax:  [phone]  Date:  08/23/2012   ID:  [name], [name] [birth year], MRN [id number]  PCP:  Wendy Adie, MD  Primary Cardiologist:  Dr. Tonny Bollman     History of Present Illness: Wendy Reid is a 62 y.o. female who returns for evaluation of cough.    She had a recent admission to the hospital for an acute anterior STEMI complicated by VF arrest and acute systolic CHF secondary to ischemic cardiomyopathy with EF 30-35% as well as Proteus mirabilis UTI.   Emergent LHC 07/21/12:  pLAD 100%, pCFX 30-40%, ant and apical AK, EF 30%.  PCI:  Promus Premier DES to pLAD.  She had recurrent pain post cath/PCI and relook cath demonstrated patent LAD stent. Echo 07/22/12:  Focal basal hypertrophy, EF 30-35%, ant and apical AK, Gr 1 diast dysfn, trivial AI.  She was discharged on a LifeVest for prevention of SCD with plans to repeat an Echo in 3 mos to determine +/- ICD.  She was readmitted 2/24-2/25 with chest pain.  Troponins were elevated but down-trending (from prior MI).  No further evaluation was necessary.    She notes a cough with clear sputum production since she was extubated in the hospital.  It is not worse since starting the ACE inhibitor.  She does have seasonal allergies.  She often has a cough with this.  She thinks she has been wheezing.  Does notes some DOE.  Probably NYHA Class IIb.  She has occasional twinges of CP.  No recurrent angina.  She notes continued chest soreness from CPR.  She denies pleuritic CP or CP with lying supine.  No orthopnea, PND, edema.  No weight gain.  No fevers.  No syncope.  She did note some diaphoresis one day.    Labs (2/14):  K 3.6, creatinine 0.84, ALT 26, LDL 85, Hgb 9.7 (MCV 84.9), TSH 2.109 Labs (3/14):  K 3.8, creatinine 1.0, ALT 19, LDL 55   Wt Readings from Last 3 Encounters:  08/23/12 225 lb (102.059 kg)  08/07/12 225 lb 1.9 oz (102.114  kg)  07/31/12 227 lb 15.3 oz (103.4 kg)     Past Medical History  Diagnosis Date  . Asthma   . GERD (gastroesophageal reflux disease)   . Hypertension   . 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 Barrett's esophagus with esophagitis 11/01/2011    Patient states no evidence of barrett's from last surveillance EGD on 11/2010  . Stress bladder incontinence, female 11/01/2011  . 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 Complex regional pain syndrome of lower limb   . Coronary artery disease     STEMI 07/2012 Cath totally occluded prox LAD s/p DES, otherwise nonobstructive dz in RCA and LCx, EF 30%  . Systolic CHF     Echo 07/22/12 EF 30-35%, grade 1 diastolic dysfunction, akinesis of the mid-distalanterior and apical myocardium  . Cardiac arrest     V.Fib arrest 07/2012 in the setting of acute anterior STEMI; Discharged with Lifevest  . Hyperlipidemia     Current Outpatient Prescriptions  Medication Sig Dispense Refill  . albuterol (PROVENTIL HFA;VENTOLIN HFA) 108 (90 BASE) MCG/ACT inhaler Inhale 2 puffs into the lungs every 4 (four) hours as needed for wheezing or shortness of breath.      Marland Kitchen albuterol (PROVENTIL) (2.5 MG/3ML) 0.083% nebulizer solution Take 2.5 mg by nebulization  every 4 (four) hours as needed for wheezing or shortness of breath.      Marland Kitchen aspirin 81 MG chewable tablet Chew 1 tablet (81 mg total) by mouth daily.      Marland Kitchen atorvastatin (LIPITOR) 80 MG tablet Take 1 tablet (80 mg total) by mouth daily at 6 PM.  30 tablet  6  . carvedilol (COREG) 12.5 MG tablet Take 1 tablet (12.5 mg total) by mouth 2 (two) times daily with a meal.  60 tablet  6  . chlorpheniramine-HYDROcodone (TUSSIONEX) 10-8 MG/5ML LQCR Take 5 mLs by mouth every 8 (eight) hours as needed.  140 mL  0  . lisinopril (PRINIVIL,ZESTRIL) 2.5 MG tablet Take 0.5 tablets (1.25 mg total) by mouth daily.  45 tablet  3  . nitroGLYCERIN (NITROSTAT) 0.4 MG SL tablet Place 1 tablet (0.4 mg total) under the tongue  every 5 (five) minutes as needed for chest pain.  25 tablet  3  . nortriptyline (PAMELOR) 10 MG capsule Take 10 mg by mouth at bedtime.      Marland Kitchen oxyCODONE (OXY IR/ROXICODONE) 5 MG immediate release tablet Take 5 mg by mouth every 6 (six) hours as needed for pain.      . pantoprazole (PROTONIX) 40 MG tablet Take 40 mg by mouth daily with breakfast.       . prasugrel (EFFIENT) 10 MG TABS Take 1 tablet (10 mg total) by mouth daily.  30 tablet  6  . VITAMIN B COMPLEX-C PO Take 1 tablet by mouth daily.       No current facility-administered medications for this visit.    Allergies:    Allergies  Allergen Reactions  . Dilaudid (Hydromorphone Hcl) Nausea And Vomiting  . Vicodin (Hydrocodone-Acetaminophen) Nausea And Vomiting  . Zofran Nausea And Vomiting    Social History:  The patient  reports that she has never smoked. She has never used smokeless tobacco. She reports that she does not drink alcohol or use illicit drugs.   ROS:  Please see the history of present illness.    All other systems reviewed and negative.   PHYSICAL EXAM: VS:  BP 118/72  Pulse 89  Ht 5' 1.5" (1.562 m)  Wt 255 lb 12.8 oz (116.03 kg)  BMI 47.56 kg/m2  SpO2 98% Well nourished, well developed, in no acute distress HEENT: normal Neck: minimally elevated JVD Cardiac:  normal S1, S2; RRR; no murmur; no rub; no gallop Lungs:  clear to auscultation bilaterally, no wheezing, rhonchi or rales Abd: soft, nontender, no hepatomegaly Ext: no edema  Skin: warm and dry Neuro:  CNs 2-12 intact, no focal abnormalities noted  EKG:   NSR, HR 89, LAD, inf and anterolat Q waves, no change from prior tracing     ASSESSMENT AND PLAN:  1. Cough:  Suspect upper airway inflammation from endotracheal tube that is slow to recover.  She also has a hx of allergies that cause similar symptoms.  She has a pulmonologist in [place].  She is somewhat SOB.  No weight gain or edema.  She does have slightly elevated JVD.  Will check a CXR, BMET,  BNP, CBC today.  If BNP high, add Lasix.  Also, restart Flonase and Allegra.  May need to return to see pulmonologist for further eval.  Has had to take prednisone in the past.  She is not actively wheezing.  Do not think she needs prednisone now.  Other considerations would be to change her ACE to an ARB (but cough predated starting  this) or changing Coreg to more cardioselective beta blocker (ie Toprol XL).  No rub on exam and no pleuritic CP so doubt pericarditis or pericardial effusion.  Do not think she needs an earlier echo at this time.  2. Coronary Artery Disease:  Stable.  No angina.  Continue ASA and Effient 3. Ischemic Cardiomyopathy:  Continue ACE and beta blocker.  Echo pending in May to re-eval EF. 4. Ventricular Fibrillation Arrest:  Continue her life vest. She currently cannot drive. Plan repeat echocardiogram 3 months post PCI to determine whether or not she needs ICD.   5. Hyperlipidemia:  Continue statin.  6. Disposition:  Follow up with Dr. Excell Seltzer as planned.   Luna Glasgow, PA-C  8:52 AM 08/23/2012

## 2012-08-23 NOTE — Telephone Encounter (Signed)
lmptcb to go over lab results and to start lasix 20 qd, bmet, bnp 1 week; rx sent to [facility]

## 2012-08-23 NOTE — Telephone Encounter (Signed)
Message copied by Tarri Fuller on Wed Aug 23, 2012  6:00 PM ------      Message from: [place], [place] T      Created: Wed Aug 23, 2012  5:24 PM       Potassium and kidney function look good.      Hgb stable to improved.      BNP high.      Add Lasix 20 mg QD.      Check BMET and BNP in 1 week.      Tereso Newcomer, PA-C  4:49 PM 04/20/2012 ------

## 2012-08-31 ENCOUNTER — Emergency Department (HOSPITAL_COMMUNITY): Payer: PRIVATE HEALTH INSURANCE

## 2012-08-31 ENCOUNTER — Observation Stay (HOSPITAL_COMMUNITY): Payer: PRIVATE HEALTH INSURANCE

## 2012-08-31 ENCOUNTER — Encounter (HOSPITAL_COMMUNITY): Payer: Self-pay | Admitting: Emergency Medicine

## 2012-08-31 ENCOUNTER — Ambulatory Visit (HOSPITAL_COMMUNITY): Payer: PRIVATE HEALTH INSURANCE

## 2012-08-31 ENCOUNTER — Inpatient Hospital Stay (HOSPITAL_COMMUNITY)
Admission: EM | Admit: 2012-08-31 | Discharge: 2012-09-01 | DRG: 292 | Disposition: A | Discharge status: Home / Self Care | Payer: PRIVATE HEALTH INSURANCE | Attending: Cardiovascular Disease | Admitting: Cardiovascular Disease

## 2012-08-31 DIAGNOSIS — M1710 Unilateral primary osteoarthritis, unspecified knee: Secondary | ICD-10-CM | POA: Diagnosis present

## 2012-08-31 DIAGNOSIS — Z9861 Coronary angioplasty status: Secondary | ICD-10-CM

## 2012-08-31 DIAGNOSIS — E785 Hyperlipidemia, unspecified: Secondary | ICD-10-CM | POA: Diagnosis present

## 2012-08-31 DIAGNOSIS — Z8674 Personal history of sudden cardiac arrest: Secondary | ICD-10-CM

## 2012-08-31 DIAGNOSIS — Z79899 Other long term (current) drug therapy: Secondary | ICD-10-CM

## 2012-08-31 DIAGNOSIS — I10 Essential (primary) hypertension: Secondary | ICD-10-CM | POA: Diagnosis present

## 2012-08-31 DIAGNOSIS — R079 Chest pain, unspecified: Secondary | ICD-10-CM

## 2012-08-31 DIAGNOSIS — I498 Other specified cardiac arrhythmias: Secondary | ICD-10-CM | POA: Diagnosis present

## 2012-08-31 DIAGNOSIS — G894 Chronic pain syndrome: Secondary | ICD-10-CM | POA: Diagnosis present

## 2012-08-31 DIAGNOSIS — Z90710 Acquired absence of both cervix and uterus: Secondary | ICD-10-CM

## 2012-08-31 DIAGNOSIS — I2510 Atherosclerotic heart disease of native coronary artery without angina pectoris: Secondary | ICD-10-CM | POA: Diagnosis present

## 2012-08-31 DIAGNOSIS — I5023 Acute on chronic systolic (congestive) heart failure: Principal | ICD-10-CM | POA: Diagnosis present

## 2012-08-31 DIAGNOSIS — R0902 Hypoxemia: Secondary | ICD-10-CM

## 2012-08-31 DIAGNOSIS — D599 Acquired hemolytic anemia, unspecified: Secondary | ICD-10-CM

## 2012-08-31 DIAGNOSIS — R05 Cough: Secondary | ICD-10-CM

## 2012-08-31 DIAGNOSIS — I319 Disease of pericardium, unspecified: Secondary | ICD-10-CM | POA: Diagnosis present

## 2012-08-31 DIAGNOSIS — Z8249 Family history of ischemic heart disease and other diseases of the circulatory system: Secondary | ICD-10-CM

## 2012-08-31 DIAGNOSIS — I509 Heart failure, unspecified: Secondary | ICD-10-CM | POA: Diagnosis present

## 2012-08-31 DIAGNOSIS — I2589 Other forms of chronic ischemic heart disease: Secondary | ICD-10-CM | POA: Diagnosis present

## 2012-08-31 DIAGNOSIS — E669 Obesity, unspecified: Secondary | ICD-10-CM | POA: Diagnosis present

## 2012-08-31 DIAGNOSIS — S2220XA Unspecified fracture of sternum, initial encounter for closed fracture: Secondary | ICD-10-CM

## 2012-08-31 DIAGNOSIS — Z7982 Long term (current) use of aspirin: Secondary | ICD-10-CM

## 2012-08-31 DIAGNOSIS — R059 Cough, unspecified: Secondary | ICD-10-CM | POA: Diagnosis present

## 2012-08-31 DIAGNOSIS — K219 Gastro-esophageal reflux disease without esophagitis: Secondary | ICD-10-CM | POA: Diagnosis present

## 2012-08-31 DIAGNOSIS — J45909 Unspecified asthma, uncomplicated: Secondary | ICD-10-CM | POA: Diagnosis present

## 2012-08-31 DIAGNOSIS — I252 Old myocardial infarction: Secondary | ICD-10-CM

## 2012-08-31 DIAGNOSIS — Z885 Allergy status to narcotic agent status: Secondary | ICD-10-CM

## 2012-08-31 DIAGNOSIS — IMO0001 Reserved for inherently not codable concepts without codable children: Secondary | ICD-10-CM

## 2012-08-31 DIAGNOSIS — D649 Anemia, unspecified: Secondary | ICD-10-CM | POA: Diagnosis present

## 2012-08-31 DIAGNOSIS — Z9089 Acquired absence of other organs: Secondary | ICD-10-CM

## 2012-08-31 DIAGNOSIS — Z888 Allergy status to other drugs, medicaments and biological substances status: Secondary | ICD-10-CM

## 2012-08-31 DIAGNOSIS — D589 Hereditary hemolytic anemia, unspecified: Secondary | ICD-10-CM

## 2012-08-31 HISTORY — DX: Ischemic cardiomyopathy: I25.5

## 2012-08-31 HISTORY — DX: Other allergy status, other than to drugs and biological substances: Z91.09

## 2012-08-31 LAB — COMPREHENSIVE METABOLIC PANEL
ALT: 16 U/L (ref 0–35)
AST: 17 U/L (ref 0–37)
Alkaline Phosphatase: 109 U/L (ref 39–117)
CO2: 23 mEq/L (ref 19–32)
Chloride: 104 mEq/L (ref 96–112)
GFR calc non Af Amer: 77 mL/min — ABNORMAL LOW (ref >90)
Potassium: 3.7 mEq/L (ref 3.5–5.1)
Sodium: 141 mEq/L (ref 135–145)
Total Bilirubin: 1 mg/dL (ref 0.3–1.2)

## 2012-08-31 LAB — CBC WITH DIFFERENTIAL/PLATELET
Basophils Absolute: 0 10*3/uL (ref 0.0–0.1)
HCT: 33.6 % — ABNORMAL LOW (ref 36.0–46.0)
Lymphocytes Relative: 33 % (ref 12–46)
Monocytes Absolute: 0.2 10*3/uL (ref 0.1–1.0)
Neutro Abs: 2.7 10*3/uL (ref 1.7–7.7)
Platelets: 268 10*3/uL (ref 150–400)
RDW: 13.7 % (ref 11.5–15.5)
WBC: 4.6 10*3/uL (ref 4.0–10.5)

## 2012-08-31 LAB — POCT I-STAT TROPONIN I

## 2012-08-31 LAB — TROPONIN I: Troponin I: <0.3 ng/mL (ref <0.30)

## 2012-08-31 LAB — PRO B NATRIURETIC PEPTIDE: Pro B Natriuretic peptide (BNP): 2623 pg/mL — ABNORMAL HIGH (ref 0–125)

## 2012-08-31 MED ORDER — LISINOPRIL 2.5 MG PO TABS
1.2500 mg | ORAL_TABLET | Freq: Every day | ORAL | Status: DC
  Administered 2012-08-31 – 2012-09-01 (×2): 1.25 mg via ORAL
  Filled 2012-08-31 (×2): qty 0.5

## 2012-08-31 MED ORDER — FUROSEMIDE 20 MG PO TABS
20.0000 mg | ORAL_TABLET | Freq: Every day | ORAL | Status: DC
  Administered 2012-09-01: 20 mg via ORAL
  Filled 2012-08-31: qty 1

## 2012-08-31 MED ORDER — PANTOPRAZOLE SODIUM 40 MG PO TBEC
40.0000 mg | DELAYED_RELEASE_TABLET | Freq: Every day | ORAL | Status: DC
  Administered 2012-09-01: 40 mg via ORAL
  Filled 2012-08-31: qty 1

## 2012-08-31 MED ORDER — HEPARIN BOLUS VIA INFUSION: 4000.0000 [IU] | Freq: Once | INTRAVENOUS | Status: DC

## 2012-08-31 MED ORDER — LORATADINE 10 MG PO TABS
10.0000 mg | ORAL_TABLET | Freq: Every day | ORAL | Status: DC | PRN
  Filled 2012-08-31: qty 1

## 2012-08-31 MED ORDER — FLUTICASONE PROPIONATE 50 MCG/ACT NA SUSP
1.0000 | Freq: Every day | NASAL | Status: DC
  Filled 2012-08-31: qty 16

## 2012-08-31 MED ORDER — ASPIRIN 81 MG PO CHEW
324.0000 mg | CHEWABLE_TABLET | ORAL | Status: AC
  Administered 2012-08-31: 324 mg via ORAL
  Filled 2012-08-31: qty 4

## 2012-08-31 MED ORDER — ALBUTEROL SULFATE HFA 108 (90 BASE) MCG/ACT IN AERS
2.0000 | INHALATION_SPRAY | RESPIRATORY_TRACT | Status: DC | PRN
  Filled 2012-08-31: qty 6.7

## 2012-08-31 MED ORDER — IOHEXOL 350 MG/ML SOLN
100.0000 mL | Freq: Once | INTRAVENOUS | Status: AC | PRN
  Administered 2012-08-31: 100 mL via INTRAVENOUS

## 2012-08-31 MED ORDER — SODIUM CHLORIDE 0.9 % IJ SOLN
3.0000 mL | Freq: Two times a day (BID) | INTRAMUSCULAR | Status: DC
  Administered 2012-09-01: 3 mL via INTRAVENOUS

## 2012-08-31 MED ORDER — ONDANSETRON HCL 4 MG/2ML IJ SOLN: 4.0000 mg | Freq: Four times a day (QID) | INTRAMUSCULAR | Status: DC | PRN

## 2012-08-31 MED ORDER — HYDROCOD POLST-CHLORPHEN POLST 10-8 MG/5ML PO LQCR
5.0000 mL | Freq: Two times a day (BID) | ORAL | Status: DC | PRN
  Administered 2012-08-31 – 2012-09-01 (×3): 5 mL via ORAL
  Filled 2012-08-31 (×3): qty 5

## 2012-08-31 MED ORDER — CARVEDILOL 12.5 MG PO TABS
12.5000 mg | ORAL_TABLET | Freq: Two times a day (BID) | ORAL | Status: DC
  Administered 2012-08-31 – 2012-09-01 (×3): 12.5 mg via ORAL
  Filled 2012-08-31 (×3): qty 1

## 2012-08-31 MED ORDER — ATORVASTATIN CALCIUM 80 MG PO TABS
80.0000 mg | ORAL_TABLET | Freq: Every day | ORAL | Status: DC
  Administered 2012-08-31 – 2012-09-01 (×2): 80 mg via ORAL
  Filled 2012-08-31 (×2): qty 1

## 2012-08-31 MED ORDER — ALBUTEROL SULFATE (5 MG/ML) 0.5% IN NEBU: 2.5000 mg | INHALATION_SOLUTION | RESPIRATORY_TRACT | Status: DC | PRN

## 2012-08-31 MED ORDER — FUROSEMIDE 20 MG PO TABS
20.0000 mg | ORAL_TABLET | Freq: Once | ORAL | Status: AC
  Administered 2012-08-31: 20 mg via ORAL
  Filled 2012-08-31: qty 1

## 2012-08-31 MED ORDER — PRASUGREL HCL 10 MG PO TABS
10.0000 mg | ORAL_TABLET | Freq: Every day | ORAL | Status: DC
  Administered 2012-08-31 – 2012-09-01 (×2): 10 mg via ORAL
  Filled 2012-08-31 (×2): qty 1

## 2012-08-31 MED ORDER — SODIUM CHLORIDE 0.9 % IJ SOLN: 3.0000 mL | INTRAMUSCULAR | Status: DC | PRN

## 2012-08-31 MED ORDER — NITROGLYCERIN 0.4 MG SL SUBL: 0.4000 mg | SUBLINGUAL_TABLET | SUBLINGUAL | Status: DC | PRN

## 2012-08-31 MED ORDER — ASPIRIN 81 MG PO CHEW
81.0000 mg | CHEWABLE_TABLET | Freq: Every day | ORAL | Status: DC
  Administered 2012-09-01: 81 mg via ORAL
  Filled 2012-08-31: qty 1

## 2012-08-31 MED ORDER — SODIUM CHLORIDE 0.9 % IV SOLN: 250.0000 mL | INTRAVENOUS | Status: DC | PRN

## 2012-08-31 MED ORDER — OXYCODONE HCL 5 MG PO TABS: 5.0000 mg | ORAL_TABLET | Freq: Four times a day (QID) | ORAL | Status: DC | PRN

## 2012-08-31 MED ORDER — HEPARIN (PORCINE) IN NACL 100-0.45 UNIT/ML-% IJ SOLN
1200.0000 [IU]/h | INTRAMUSCULAR | Status: DC
  Filled 2012-08-31: qty 250

## 2012-08-31 NOTE — Progress Notes (Signed)
Pt was sent to CT angio for hypoxemia.  She was found to have a pericardial effusion as well as a fractured sternum.  I suspect this may be from her CPR during her last admission.  No signs of cardiac tamponade at present.  Will get an echo tomorrow.   Vesta Mixer, Montez Hageman., MD, [facility] 08/31/2012, 6:17 PM Office - [phone] Pager [phone]

## 2012-08-31 NOTE — ED Notes (Signed)
Cardiologists at bedside

## 2012-08-31 NOTE — ED Notes (Signed)
Pt has defib pack

## 2012-08-31 NOTE — ED Notes (Signed)
Sob x 2 hours no n/v but states has had cp on and off was told to expect that ? Has hx of mi in jan . Has had a cough and has been taking tussinex

## 2012-08-31 NOTE — Progress Notes (Signed)
ANTICOAGULATION CONSULT NOTE - Initial Consult  Pharmacy Consult for heparin Indication: r/o PE  Allergies  Allergen Reactions  . Dilaudid (Hydromorphone Hcl) Nausea And Vomiting  . Vicodin (Hydrocodone-Acetaminophen) Nausea And Vomiting  . Zofran Nausea And Vomiting    Patient Measurements: Height: 5\' 2"  (157.5 cm) Weight: 225 lb (102.059 kg) IBW/kg (Calculated) : 50.1 Heparin Dosing Weight: 74  Vital Signs: Temp: 98.2 F (36.8 C) (03/27 1048) Temp src: Oral (03/27 1048) BP: 124/85 mmHg (03/27 1500) Pulse Rate: 101 (03/27 1500)  Labs:  Recent Labs  08/31/12 1110  HGB 11.2*  HCT 33.6*  PLT 268  CREATININE 0.81    Estimated Creatinine Clearance: 82.7 ml/min (by C-G formula based on Cr of 0.81).   Medical History: Past Medical History  Diagnosis Date  . Asthma   . GERD (gastroesophageal reflux disease)   . Hypertension   . 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 Barrett's esophagus with esophagitis 11/01/2011    Patient states no evidence of barrett's from last surveillance EGD on 11/2010  . Stress bladder incontinence, female 11/01/2011  . 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 Complex regional pain syndrome of lower limb   . Coronary artery disease     STEMI 07/2012 Cath totally occluded prox LAD s/p DES, otherwise nonobstructive dz in RCA and LCx, EF 30%  . Systolic CHF     Echo 07/22/12 EF 30-35%, grade 1 diastolic dysfunction, akinesis of the mid-distalanterior and apical myocardium  . Cardiac arrest     V.Fib arrest 07/2012 in the setting of acute anterior STEMI; Discharged with Lifevest  . Hyperlipidemia   . Environmental allergies     Uses albuterol due to wheezing.    Medications:   (Not in a hospital admission) Scheduled:  . aspirin  324 mg Oral NOW  . prasugrel  10 mg Oral Daily    Assessment: 62 yo with hx CAD who was admitted for SOB. Was ready for dc but develop SOB. IV heparin has been ordered to r/o PE. F/u with CT  scan  Goal of Therapy:  Heparin level 0.3-0.7 units/ml Monitor platelets by anticoagulation protocol: Yes   Plan:  Heparin bolus 4000 units x1 Heparin drip at 1200 units/hr  F/u with 6 hr heparin level  Uly[place] 08/31/2012,3:47 PM

## 2012-08-31 NOTE — ED Notes (Signed)
Jody, NT informed me that pt needed to use restroom but was having chest pain.  Pt was asked to stay in bed and use bedpan. Pt refused.

## 2012-08-31 NOTE — ED Notes (Signed)
Pts o2 was 83 when ambulating.3:18pm JG.

## 2012-08-31 NOTE — Consult Note (Addendum)
History and Physical  Patient ID: NAHOMI HEGNER MRN: [id number], DOB: May 06, 1951 Date of Encounter: 08/31/2012, 2:46 PM Primary Physician: Rosario Adie, MD Primary Cardiologist: Excell Seltzer  Chief Complaint: SOB, chest pain  HPI: Ms. Maske is a 62 y/o F with history of CAD who presented to [facility] today with primarily SOB but also fleeting CP. She had a STEMI 07/21/12 complicated by VF arrest s/p DES to pLAD and acute systolic CHF secondary to ischemic cardiomyopathy with EF 30-35%. She had recurrent pain that admission with relook cath that showed patent stent. She was discharged on a Lifevest with plans to repeat echo in 3 months to determine +/- ICD - still wearing this. She has been feeling well lately. She began to feel sudden SOB out of the blue this morning and this has persisted so she came to the ED. She did not have any other symptoms at that time including nausea, diaphoresis, or palpitations. Troponin neg x 1, CXR without acute CP abnormality, EKG nonacute. About an hour ago, she had a few seconds of squeezing chest pain but has felt this on/off since her STEMI. (Was admitted and ruled out 2/24 for CP.) This pain is not elicited with inspiration, palpation, or exertion. She gets a little winded with the beginning of exertion but after walking further, she feels stable and is able to continue. She has been compliant with her medications. She has not yet taken her medicines yet today except Lasix. She did have a salty meal of beans several days ago. No orthopnea, PND, LEE, palpitations, Lifevest gel deployment/shock, syncope, weight changes. HR in the ER is 95-105 here but she missed her morning Coreg due to being in the ED. Hgb stable at 11.5 but she has h/o hemolytic anemia.  Past Medical History  Diagnosis Date  . Asthma   . GERD (gastroesophageal reflux disease)   . Hypertension   . 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 Barrett's  esophagus with esophagitis 11/01/2011    Patient states no evidence of barrett's from last surveillance EGD on 11/2010  . Stress bladder incontinence, female 11/01/2011  . 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 Complex regional pain syndrome of lower limb   . Coronary artery disease     STEMI 07/2012 Cath totally occluded prox LAD s/p DES, otherwise nonobstructive dz in RCA and LCx, EF 30%  . Systolic CHF     Echo 07/22/12 EF 30-35%, grade 1 diastolic dysfunction, akinesis of the mid-distalanterior and apical myocardium  . Cardiac arrest     V.Fib arrest 07/2012 in the setting of acute anterior STEMI; Discharged with Lifevest  . Hyperlipidemia     Surgical History:  Past Surgical History  Procedure Laterality Date  . Cholecystectomy  nov 2011  . Appendectomy  as teenager  . Tonsillectomy  as teenager  . Abdominal hysterectomy  1999    complete  . Both knees arthroscopic sugery last done 2007    . Total knee arthroplasty  10/29/2011    Procedure: TOTAL KNEE ARTHROPLASTY;  Surgeon: Eugenia Mcalpine, MD;  Location: [facility];  Service: Orthopedics;  Laterality: Right;  . Coronary angioplasty with stent placement Left 07/21/2012    LAD x 1      Home Meds: Prior to Admission medications   Medication Sig Start Date End Date Taking? Authorizing Provider  albuterol (PROVENTIL HFA;VENTOLIN HFA) 108 (90 BASE) MCG/ACT inhaler Inhale 2 puffs into the lungs every 4 (  four) hours as needed for wheezing or shortness of breath.   Yes Historical Provider, MD  albuterol (PROVENTIL) (2.5 MG/3ML) 0.083% nebulizer solution Take 2.5 mg by nebulization every 4 (four) hours as needed for wheezing or shortness of breath.   Yes Historical Provider, MD  aspirin 81 MG chewable tablet Chew 81 mg by mouth daily.   Yes Historical Provider, MD  atorvastatin (LIPITOR) 80 MG tablet Take 80 mg by mouth daily.   Yes Historical Provider, MD  carvedilol (COREG) 12.5 MG tablet Take 12.5 mg by mouth 2 (two) times daily.   Yes Historical  Provider, MD  chlorpheniramine-HYDROcodone (TUSSIONEX) 10-8 MG/5ML LQCR Take 5 mLs by mouth every 8 (eight) hours as needed (for cough).   Yes Historical Provider, MD  fexofenadine (ALLEGRA) 180 MG tablet Take 180 mg by mouth daily as needed (for allergies).   Yes Historical Provider, MD  fluticasone (FLONASE) 50 MCG/ACT nasal spray Place 1 spray into the nose daily.   Yes Historical Provider, MD  furosemide (LASIX) 20 MG tablet Take 20 mg by mouth daily.   Yes Historical Provider, MD  lisinopril (PRINIVIL,ZESTRIL) 2.5 MG tablet Take 1.25 mg by mouth daily.   Yes Historical Provider, MD  oxyCODONE (OXY IR/ROXICODONE) 5 MG immediate release tablet Take 5 mg by mouth every 6 (six) hours as needed for pain.   Yes Historical Provider, MD  pantoprazole (PROTONIX) 40 MG tablet Take 40 mg by mouth daily with breakfast.    Yes Historical Provider, MD  prasugrel (EFFIENT) 10 MG TABS Take 10 mg by mouth daily.   Yes Historical Provider, MD  nitroGLYCERIN (NITROSTAT) 0.4 MG SL tablet Place 0.4 mg under the tongue every 5 (five) minutes as needed for chest pain.    Historical Provider, MD    Allergies:  Allergies  Allergen Reactions  . Dilaudid (Hydromorphone Hcl) Nausea And Vomiting  . Vicodin (Hydrocodone-Acetaminophen) Nausea And Vomiting  . Zofran Nausea And Vomiting    History   Social History  . Marital Status: Widowed    Spouse Name: N/A    Number of Children: N/A  . Years of Education: N/A   Occupational History  . Not on file.   Social History Main Topics  . Smoking status: Never Smoker   . Smokeless tobacco: Never Used  . Alcohol Use: No  . Drug Use: No  . Sexually Active: Not on file   Other Topics Concern  . Not on file   Social History Narrative  . No narrative on file     Family History  Problem Relation Age of Onset  . Heart disease Mother     MI at age 59s    Review of Systems: General: negative for chills, fever, night sweats Cardiovascular: see  above Dermatological: negative for rash Respiratory: negative for wheezing related to allergies. occ cough, nonproductive that seems to be improving. Urologic: negative for hematuria Abdominal: negative for nausea, vomiting, diarrhea, bright red blood per rectum, melena, or hematemesis Neurologic: negative for visual changes, syncope, or dizziness All other systems reviewed and are otherwise negative except as noted above.  Labs:   Lab Results  Component Value Date   WBC 4.6 08/31/2012   HGB 11.2* 08/31/2012   HCT 33.6* 08/31/2012   MCV 85.1 08/31/2012   PLT 268 08/31/2012     Recent Labs Lab 08/31/12 1110  NA 141  K 3.7  CL 104  CO2 23  BUN 13  CREATININE 0.81  CALCIUM 10.2  PROT 8.9*  BILITOT  1.0  ALKPHOS 109  ALT 16  AST 17  GLUCOSE 105*   Troponin neg x 1  Lab Results  Component Value Date   CHOL 124 08/16/2012   HDL 48.00 08/16/2012   LDLCALC 55 08/16/2012   TRIG 105.0 08/16/2012     Radiology/Studies:  Dg Chest 2 View 08/31/2012  *RADIOLOGY REPORT*  Clinical Data: 62 year old female left side chest pain, productive cough.  Hypertension.  CHEST - 2 VIEW  Comparison: 08/23/2012 and earlier.  Findings: Stable cardiomegaly and mediastinal contours.  Stable lung volumes. Visualized tracheal air column is within normal limits.  No pneumothorax, pulmonary edema, pleural effusion or confluent pulmonary opacity. No acute osseous abnormality identified.  Stable right upper quadrant surgical clips.  IMPRESSION: Stable cardiomegaly.  No acute cardiopulmonary abnormality.   Original Report Authenticated By: Erskine Speed, M.D.    Dg Chest 2 View  08/23/2012  *RADIOLOGY REPORT*  Clinical Data: Cough for 1 month, asthma, recent MI  CHEST - 2 VIEW  Comparison: 07/31/2012  Findings: Borderline cardiomegaly. No acute infiltrate or pleural effusion.  No pulmonary edema.  Mild degenerative changes thoracic spine.  Post cholecystectomy surgical clips are noted in the right upper abdomen.   IMPRESSION: No active disease.  Borderline cardiomegaly.   Original Report Authenticated By: Natasha Mead, M.D.    EKG: sinus tach 104bpm inferior infarct age undetermined, anterolateral infarct age undetermined, no acute changes from prior  Physical Exam: Blood pressure 141/101, pulse 105, temperature 98.2 F (36.8 C), temperature source Oral, resp. rate 20, SpO2 100.00%. General: Well developed, well nourished AAF in no acute distress. Comfortable appearing Head: Normocephalic, atraumatic, sclera non-icteric, no xanthomas, nares are without discharge.  Neck: JVD not elevated. Lungs: Clear bilaterally to auscultation without wheezes, rales, or rhonchi. Breathing is unlabored. Heart: Regular rhythm, borderline elevated rate, with S1 S2. No murmurs, rubs, or gallops appreciated. Abdomen: Soft, non-tender, non-distended with normoactive bowel sounds. No hepatomegaly. No rebound/guarding. No obvious abdominal masses. Msk:  Strength and tone appear normal for age. Extremities: No clubbing or cyanosis. No edema.  Distal pedal pulses are 2+ and equal bilaterally. Neuro: Alert and oriented X 3. No focal deficit. No facial asymmetry. Moves all extremities spontaneously. Psych:  Responds to questions appropriately with a normal affect.    ASSESSMENT AND PLAN:   1. Dyspea/chest pain 2. CAD s/p STEMI 07/2012 s/p DES to LAD (c/b V-fib, CHF) 3. ICM/chronic systolic CHF with EF 30%, on Lifevest 4. V fib arrest 07/2011, continue Lifevest 5. HTN 6. Sinus tachycardia, but has not taken BB today yet 7. Seasonal allergies, uses PRN bronchodilator  Signed, Ronie Spies PA-C 08/31/2012, 2:46 PM  Attending Note:   The patient was seen and examined.  Agree with assessment and plan as noted above.  Changes made to the above note as needed.  Brooklynn presents  With some dyspnea but is completely better at this point.  She is mildly tachycardic because she has not taken her coreg this am.  Her O2 sats are 100% on  RA.  No CP. No pleuretic CP.   With ambulation, her sats dropped from 100% to 83%.  She also had some dizziness.  I think her volume status is about normal.  Given the tachycardia, hypoxemia, and dizziness, I am concerned about  A PE.  Will get a CT angio.  Start heparin and then we can DC if the CT angio is normal.    I do not think this is due to coronary ischemia.  Vesta Mixer, Montez Hageman., MD, [facility] 08/31/2012, 3:06 PM

## 2012-08-31 NOTE — H&P (Signed)
Our original cardiology H&P was signed as a consult note since she was initially expected to go home from ED if ambulation was normal. However, she desatted with ambulation and will be admitted overnight. See Consult Note for her H&P. Ronie Spies PA-C   Patient ID: AHNI BRADWELL MRN: [id number], DOB: Apr 15, 1951  Date of Encounter: 08/31/2012, 2:46 PM  Primary Physician: Rosario Adie, MD  Primary Cardiologist: Excell Seltzer  Chief Complaint: SOB, chest pain  HPI: Ms. Hyser is a 62 y/o F with history of CAD who presented to [facility] today with primarily SOB but also fleeting CP. She had a STEMI 07/21/12 complicated by VF arrest s/p DES to pLAD and acute systolic CHF secondary to ischemic cardiomyopathy with EF 30-35%. She had recurrent pain that admission with relook cath that showed patent stent. She was discharged on a Lifevest with plans to repeat echo in 3 months to determine +/- ICD - still wearing this. She has been feeling well lately. She began to feel sudden SOB out of the blue this morning and this has persisted so she came to the ED. She did not have any other symptoms at that time including nausea, diaphoresis, or palpitations. Troponin neg x 1, CXR without acute CP abnormality, EKG nonacute. About an hour ago, she had a few seconds of squeezing chest pain but has felt this on/off since her STEMI. (Was admitted and ruled out 2/24 for CP.) This pain is not elicited with inspiration, palpation, or exertion. She gets a little winded with the beginning of exertion but after walking further, she feels stable and is able to continue. She has been compliant with her medications. She has not yet taken her medicines yet today except Lasix. She did have a salty meal of beans several days ago. No orthopnea, PND, LEE, palpitations, Lifevest gel deployment/shock, syncope, weight changes. HR in the ER is 95-105 here but she missed her morning Coreg due to being in the ED. Hgb stable at 11.5 but  she has h/o hemolytic anemia.  Past Medical History   Diagnosis  Date   .  Asthma    .  GERD (gastroesophageal reflux disease)    .  Hypertension    .  Osteoarthritis of knee  11/01/2011     Endstage OA Bilateral R > L   .  Barrett's esophagus with esophagitis  11/01/2011     Patient states no evidence of barrett's from last surveillance EGD on 11/2010   .  Stress bladder incontinence, female  11/01/2011   .  Hemolytic anemia  11/01/2011   .  Complex regional pain syndrome of lower limb    .  Coronary artery disease      STEMI 07/2012 Cath totally occluded prox LAD s/p DES, otherwise nonobstructive dz in RCA and LCx, EF 30%   .  Systolic CHF      Echo 07/22/12 EF 30-35%, grade 1 diastolic dysfunction, akinesis of the mid-distalanterior and apical myocardium   .  Cardiac arrest      V.Fib arrest 07/2012 in the setting of acute anterior STEMI; Discharged with Lifevest   .  Hyperlipidemia     Surgical History:  Past Surgical History   Procedure  Laterality  Date   .  Cholecystectomy   nov 2011   .  Appendectomy   as teenager   .  Tonsillectomy   as teenager   .  Abdominal hysterectomy   1999     complete   .  Both knees arthroscopic sugery last done 2007     .  Total knee arthroplasty   10/29/2011     Procedure: TOTAL KNEE ARTHROPLASTY; Surgeon: Eugenia Mcalpine, MD; Location: [facility]; Service: Orthopedics; Laterality: Right;   .  Coronary angioplasty with stent placement  Left  07/21/2012     LAD x 1    Home Meds:  Prior to Admission medications   Medication  Sig  Start Date  End Date  Taking?  Authorizing Provider   albuterol (PROVENTIL HFA;VENTOLIN HFA) 108 (90 BASE) MCG/ACT inhaler  Inhale 2 puffs into the lungs every 4 (four) hours as needed for wheezing or shortness of breath.    Yes  Historical Provider, MD   albuterol (PROVENTIL) (2.5 MG/3ML) 0.083% nebulizer solution  Take 2.5 mg by nebulization every 4 (four) hours as needed for wheezing or shortness of breath.    Yes  Historical  Provider, MD   aspirin 81 MG chewable tablet  Chew 81 mg by mouth daily.    Yes  Historical Provider, MD   atorvastatin (LIPITOR) 80 MG tablet  Take 80 mg by mouth daily.    Yes  Historical Provider, MD   carvedilol (COREG) 12.5 MG tablet  Take 12.5 mg by mouth 2 (two) times daily.    Yes  Historical Provider, MD   chlorpheniramine-HYDROcodone (TUSSIONEX) 10-8 MG/5ML LQCR  Take 5 mLs by mouth every 8 (eight) hours as needed (for cough).    Yes  Historical Provider, MD   fexofenadine (ALLEGRA) 180 MG tablet  Take 180 mg by mouth daily as needed (for allergies).    Yes  Historical Provider, MD   fluticasone (FLONASE) 50 MCG/ACT nasal spray  Place 1 spray into the nose daily.    Yes  Historical Provider, MD   furosemide (LASIX) 20 MG tablet  Take 20 mg by mouth daily.    Yes  Historical Provider, MD   lisinopril (PRINIVIL,ZESTRIL) 2.5 MG tablet  Take 1.25 mg by mouth daily.    Yes  Historical Provider, MD   oxyCODONE (OXY IR/ROXICODONE) 5 MG immediate release tablet  Take 5 mg by mouth every 6 (six) hours as needed for pain.    Yes  Historical Provider, MD   pantoprazole (PROTONIX) 40 MG tablet  Take 40 mg by mouth daily with breakfast.    Yes  Historical Provider, MD   prasugrel (EFFIENT) 10 MG TABS  Take 10 mg by mouth daily.    Yes  Historical Provider, MD   nitroGLYCERIN (NITROSTAT) 0.4 MG SL tablet  Place 0.4 mg under the tongue every 5 (five) minutes as needed for chest pain.     Historical Provider, MD   Allergies:  Allergies   Allergen  Reactions   .  Dilaudid (Hydromorphone Hcl)  Nausea And Vomiting   .  Vicodin (Hydrocodone-Acetaminophen)  Nausea And Vomiting   .  Zofran  Nausea And Vomiting    History    Social History   .  Marital Status:  Widowed     Spouse Name:  N/A     Number of Children:  N/A   .  Years of Education:  N/A    Occupational History   .  Not on file.    Social History Main Topics   .  Smoking status:  Never Smoker   .  Smokeless tobacco:  Never Used   .   Alcohol Use:  No   .  Drug Use:  No   .  Sexually Active:  Not on file    Other Topics  Concern   .  Not on file    Social History Narrative   .  No narrative on file    Family History   Problem  Relation  Age of Onset   .  Heart disease  Mother      MI at age 15s    Review of Systems:  General: negative for chills, fever, night sweats  Cardiovascular: see above  Dermatological: negative for rash  Respiratory: negative for wheezing related to allergies. occ cough, nonproductive that seems to be improving.  Urologic: negative for hematuria  Abdominal: negative for nausea, vomiting, diarrhea, bright red blood per rectum, melena, or hematemesis  Neurologic: negative for visual changes, syncope, or dizziness  All other systems reviewed and are otherwise negative except as noted above.  Labs:  Lab Results   Component  Value  Date    WBC  4.6  08/31/2012    HGB  11.2*  08/31/2012    HCT  33.6*  08/31/2012    MCV  85.1  08/31/2012    PLT  268  08/31/2012     Recent Labs  Lab  08/31/12 1110   NA  141   K  3.7   CL  104   CO2  23   BUN  13   CREATININE  0.81   CALCIUM  10.2   PROT  8.9*   BILITOT  1.0   ALKPHOS  109   ALT  16   AST  17   GLUCOSE  105*    Troponin neg x 1  Lab Results   Component  Value  Date    CHOL  124  08/16/2012    HDL  48.00  08/16/2012    LDLCALC  55  08/16/2012    TRIG  105.0  08/16/2012    Radiology/Studies:  Dg Chest 2 View 08/31/2012 *RADIOLOGY REPORT* Clinical Data: 62 year old female left side chest pain, productive cough. Hypertension. CHEST - 2 VIEW Comparison: 08/23/2012 and earlier. Findings: Stable cardiomegaly and mediastinal contours. Stable lung volumes. Visualized tracheal air column is within normal limits. No pneumothorax, pulmonary edema, pleural effusion or confluent pulmonary opacity. No acute osseous abnormality identified. Stable right upper quadrant surgical clips. IMPRESSION: Stable cardiomegaly. No acute cardiopulmonary  abnormality. Original Report Authenticated By: Erskine Speed, M.D.  Dg Chest 2 View  08/23/2012 *RADIOLOGY REPORT* Clinical Data: Cough for 1 month, asthma, recent MI CHEST - 2 VIEW Comparison: 07/31/2012 Findings: Borderline cardiomegaly. No acute infiltrate or pleural effusion. No pulmonary edema. Mild degenerative changes thoracic spine. Post cholecystectomy surgical clips are noted in the right upper abdomen. IMPRESSION: No active disease. Borderline cardiomegaly. Original Report Authenticated By: Natasha Mead, M.D.  EKG: sinus tach 104bpm inferior infarct age undetermined, anterolateral infarct age undetermined, no acute changes from prior  Physical Exam:  Blood pressure 141/101, pulse 105, temperature 98.2 F (36.8 C), temperature source Oral, resp. rate 20, SpO2 100.00%.  General: Well developed, well nourished AAF in no acute distress. Comfortable appearing  Head: Normocephalic, atraumatic, sclera non-icteric, no xanthomas, nares are without discharge.  Neck: JVD not elevated.  Lungs: Clear bilaterally to auscultation without wheezes, rales, or rhonchi. Breathing is unlabored.  Heart: Regular rhythm, borderline elevated rate, with S1 S2. No murmurs, rubs, or gallops appreciated.  Abdomen: Soft, non-tender, non-distended with normoactive bowel sounds. No hepatomegaly. No rebound/guarding. No obvious abdominal masses.  Msk: Strength and tone appear normal for age.  Extremities: No clubbing or cyanosis. No edema. Distal pedal pulses are 2+ and equal bilaterally.  Neuro: Alert and oriented X 3. No focal deficit. No facial asymmetry. Moves all extremities spontaneously.  Psych: Responds to questions appropriately with a normal affect.   ASSESSMENT AND PLAN:  1. Dyspea/chest pain  2. CAD s/p STEMI 07/2012 s/p DES to LAD (c/b V-fib, CHF)  3. ICM/chronic systolic CHF with EF 30%, on Lifevest  4. V fib arrest 07/2011, continue Lifevest  5. HTN  6. Sinus tachycardia, but has not taken BB today yet  7.  Seasonal allergies, uses PRN bronchodilator  Signed,  Ronie Spies PA-C  08/31/2012, 2:46 PM  Attending Note:  The patient was seen and examined. Agree with assessment and plan as noted above. Changes made to the above note as needed.  Kaena presents With some dyspnea but is completely better at this point. She is mildly tachycardic because she has not taken her coreg this am. Her O2 sats are 100% on RA. No CP. No pleuretic CP. With ambulation, her sats dropped from 100% to 83%. She also had some dizziness.  I think her volume status is about normal. Given the tachycardia, hypoxemia, and dizziness, I am concerned about A PE. Will get a CT angio. Start heparin and then we can DC if the CT angio is normal.  I do not think this is due to coronary ischemia.

## 2012-08-31 NOTE — ED Notes (Signed)
Pt called out to get asst to restroom. Pt explained to me she was having chest pains as I was starting to unhook her from the monitor. I told pt that she should stay in the bed and use a bedpan, pt refused and said no she was walking to the bathroom and pt explained she had her external defibrillator and she was walking to restroom. I let pts nurse Kahla know. 2:10pm JG.

## 2012-08-31 NOTE — ED Provider Notes (Signed)
History     CSN: [id number]  Arrival date & time 08/31/12  1040   First MD Initiated Contact with Patient 08/31/12 1156      Chief Complaint  Patient presents with  . Shortness of Breath    HPI Sob x 2 hours no n/v but states has had cp on and off was told to expect that ? Has hx of mi in jan . Has had a cough and has been taking tussinex  Past Medical History  Diagnosis Date  . Asthma   . GERD (gastroesophageal reflux disease)   . Hypertension   . 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 Barrett's esophagus with esophagitis 11/01/2011    Patient states no evidence of barrett's from last surveillance EGD on 11/2010  . Stress bladder incontinence, female 11/01/2011  . 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 Complex regional pain syndrome of lower limb   . Coronary artery disease     STEMI 07/2012 Cath totally occluded prox LAD s/p DES, otherwise nonobstructive dz in RCA and LCx, EF 30%  . Systolic CHF     Echo 07/22/12 EF 30-35%, grade 1 diastolic dysfunction, akinesis of the mid-distalanterior and apical myocardium  . Cardiac arrest     V.Fib arrest 07/2012 in the setting of acute anterior STEMI; Discharged with Lifevest  . Hyperlipidemia   . Environmental allergies     Uses albuterol due to wheezing.  . CHF (congestive heart failure)     Past Surgical History  Procedure Laterality Date  . Cholecystectomy  nov 2011  . Appendectomy  as teenager  . Tonsillectomy  as teenager  . Abdominal hysterectomy  1999    complete  . Both knees arthroscopic sugery last done 2007    . Total knee arthroplasty  10/29/2011    Procedure: TOTAL KNEE ARTHROPLASTY;  Surgeon: Eugenia Mcalpine, MD;  Location: [facility];  Service: Orthopedics;  Laterality: Right;  . Coronary angioplasty with stent placement Left 07/21/2012    LAD x 1     Family History  Problem Relation Age of Onset  . Heart disease Mother     MI at age 76s    History  Substance Use Topics  . Smoking status:  Never Smoker   . Smokeless tobacco: Never Used  . Alcohol Use: No    OB History   Grav Para Term Preterm Abortions TAB SAB Ect Mult Living                  Review of Systems  All other systems reviewed and are negative.    Allergies  Dilaudid; Vicodin; and Zofran  Home Medications   No current outpatient prescriptions on file.  BP 106/63  Pulse 101  Temp(Src) 97.6 F (36.4 C) (Oral)  Resp 18  Ht 5\' 2"  (1.575 m)  Wt 217 lb 9.5 oz (98.7 kg)  BMI 39.79 kg/m2  SpO2 93%  Physical Exam  Nursing note and vitals reviewed. Constitutional: She is oriented to person, place, and time. She appears well-developed and well-nourished. No distress.  HENT:  Head: Normocephalic and atraumatic.  Eyes: Pupils are equal, round, and reactive to light.  Neck: Normal range of motion.  Cardiovascular: Intact distal pulses.  Tachycardia present.   No murmur heard. Pulmonary/Chest: Effort normal. No respiratory distress. She has no wheezes.  Abdominal: Normal appearance. She exhibits no distension. There is no tenderness. There is no rebound.  Musculoskeletal: Normal range of motion.  Neurological: She is alert and oriented to person, place, and time. No cranial nerve deficit.  Skin: Skin is warm and dry. No rash noted.  Psychiatric: She has a normal mood and affect. Her behavior is normal.    ED Course  Procedures (including critical care time) Cardiology consulted Labs Reviewed  CBC WITH DIFFERENTIAL - Abnormal; Notable for the following:    Hemoglobin 11.2 (*)    HCT 33.6 (*)    All other components within normal limits  COMPREHENSIVE METABOLIC PANEL - Abnormal; Notable for the following:    Glucose, Bld 105 (*)    Total Protein 8.9 (*)    GFR calc non Af Amer 77 (*)    GFR calc Af Amer 90 (*)    All other components within normal limits  PRO B NATRIURETIC PEPTIDE - Abnormal; Notable for the following:    Pro B Natriuretic peptide (BNP) 2623.0 (*)    All other components  within normal limits  CBC - Abnormal; Notable for the following:    RBC 3.71 (*)    Hemoglobin 10.5 (*)    HCT 31.3 (*)    All other components within normal limits  BASIC METABOLIC PANEL - Abnormal; Notable for the following:    Glucose, Bld 112 (*)    GFR calc non Af Amer 71 (*)    GFR calc Af Amer 82 (*)    All other components within normal limits  TROPONIN I  TROPONIN I  TROPONIN I  TSH  CBC  POCT I-STAT TROPONIN I  POCT I-STAT TROPONIN I   Dg Chest 2 View  08/31/2012  *RADIOLOGY REPORT*  Clinical Data: 62 year old female left side chest pain, productive cough.  Hypertension.  CHEST - 2 VIEW  Comparison: 08/23/2012 and earlier.  Findings: Stable cardiomegaly and mediastinal contours.  Stable lung volumes. Visualized tracheal air column is within normal limits.  No pneumothorax, pulmonary edema, pleural effusion or confluent pulmonary opacity. No acute osseous abnormality identified.  Stable right upper quadrant surgical clips.  IMPRESSION: Stable cardiomegaly.  No acute cardiopulmonary abnormality.   Original Report Authenticated By: Erskine Speed, M.D.    Ct Angio Chest Pe W/cm &/or Wo Cm  08/31/2012  *RADIOLOGY REPORT*  Clinical Data: tachycardia, desaturation. Shortness of breath. Chest pain.  CT ANGIOGRAPHY CHEST  Technique:  Multidetector CT imaging of the chest using the standard protocol during bolus administration of intravenous contrast. Multiplanar reconstructed images including MIPs were obtained and reviewed to evaluate the vascular anatomy.  Contrast: OMNIPAQUE IOHEXOL 350 MG/ML SOLN  Comparison: Multiple exams, including 08/31/2012 and 11/03/2011  Findings: No filling defect is identified in the pulmonary arterial tree to suggest pulmonary embolus.  A partially imaged a small hypodense nodule in the left thyroid lobe, measuring up to 6 mm in diameter.  No aortic dissection observed.  Stent in the left anterior descending coronary artery noted  Abnormal moderate  pericardial effusion. Abnormal stranding/infiltration of the pericardial adipose tissue. Cardiomegaly noted with possible thinning of myocardium along the cardiac apex and apical septal region.  No reversal of the interventricular septal contour.  Small nonspecific hypodense lesion in segment 3 of the liver, 6 mm in diameter, no change from 11/03/2011, likely benign.  Lungs appear clear.  There is an abnormal band of transverse sclerosis in the sternum. This was not present on 11/03/2011.  Appearance raises concern for possible sternal osteomyelitis or sternal fracture which is healing.  IMPRESSION:  1.  No embolus identified. 2.  Abnormal new  pericardial fluid, unusual abnormal stranding of the pericardial fat, and an abnormal transverse band of sclerosis in the mid sternum with anterior sternal lucent line in this band. Appearance raises concern for sternal osteomyelitis with spread into the pericardial space, or possibly a healing sternal stress fracture from prior cardiopulmonary resuscitation with underlying pericarditis.  Constrictive pericarditis is not excluded. Correlate with clinical findings of tamponade and consider cardiac echo. 3.  Thinned myocardium along the left ventricular apex and apical- septal region.  I discussed these findings by telephone with Dr. Nelva Nay, who is covering this patient in the emergency department, at 5:50 p.m. on 08/31/2012.   Original Report Authenticated By: Gaylyn Rong, M.D.      1. Coronary artery disease   2. Obesity (BMI 30-39.9)   3. Hemolytic anemia   4. Hypoxemia    5.  Pericardial effusion new    MDM          Nelia Shi, MD 09/01/12 [phone]

## 2012-09-01 ENCOUNTER — Encounter (HOSPITAL_COMMUNITY): Payer: Self-pay | Admitting: Physician Assistant

## 2012-09-01 DIAGNOSIS — R05 Cough: Secondary | ICD-10-CM

## 2012-09-01 DIAGNOSIS — D649 Anemia, unspecified: Secondary | ICD-10-CM

## 2012-09-01 DIAGNOSIS — I5023 Acute on chronic systolic (congestive) heart failure: Secondary | ICD-10-CM

## 2012-09-01 DIAGNOSIS — S2220XA Unspecified fracture of sternum, initial encounter for closed fracture: Secondary | ICD-10-CM

## 2012-09-01 DIAGNOSIS — I319 Disease of pericardium, unspecified: Secondary | ICD-10-CM

## 2012-09-01 DIAGNOSIS — I509 Heart failure, unspecified: Secondary | ICD-10-CM

## 2012-09-01 LAB — CBC
HCT: 31.3 % — ABNORMAL LOW (ref 36.0–46.0)
Hemoglobin: 10.5 g/dL — ABNORMAL LOW (ref 12.0–15.0)
MCV: 84.4 fL (ref 78.0–100.0)
WBC: 4.8 10*3/uL (ref 4.0–10.5)

## 2012-09-01 LAB — BASIC METABOLIC PANEL
CO2: 24 mEq/L (ref 19–32)
Calcium: 9.7 mg/dL (ref 8.4–10.5)
Chloride: 105 mEq/L (ref 96–112)
Glucose, Bld: 112 mg/dL — ABNORMAL HIGH (ref 70–99)
Potassium: 3.6 mEq/L (ref 3.5–5.1)
Sodium: 140 mEq/L (ref 135–145)

## 2012-09-01 MED ORDER — HYDROCOD POLST-CHLORPHEN POLST 10-8 MG/5ML PO LQCR: 5.0000 mL | Freq: Two times a day (BID) | ORAL | Status: DC | PRN

## 2012-09-01 MED ORDER — LISINOPRIL 2.5 MG PO TABS: 2.5000 mg | ORAL_TABLET | Freq: Every day | ORAL | Status: DC

## 2012-09-01 MED ORDER — FUROSEMIDE 40 MG PO TABS: 40.0000 mg | ORAL_TABLET | Freq: Every day | ORAL | Status: DC

## 2012-09-01 NOTE — Progress Notes (Signed)
  Echocardiogram 2D Echocardiogram has been performed.  Wendy Reid 09/01/2012, 11:59 AM

## 2012-09-01 NOTE — Progress Notes (Signed)
    Subjective:  No chest pain or dyspnea at rest. She feels much better today.  Objective:  Vital Signs in the last 24 hours: Temp:  [97.6 F (36.4 C)-98.8 F (37.1 C)] 97.6 F (36.4 C) (03/28 0419) Pulse Rate:  [79-110] 101 (03/28 1037) Resp:  [18-31] 18 (03/28 1037) BP: (84-137)/(62-91) 106/63 mmHg (03/28 1037) SpO2:  [93 %-100 %] 93 % (03/28 1037) Weight:  [93.8 kg (206 lb 12.7 oz)-102.059 kg (225 lb)] 98.7 kg (217 lb 9.5 oz) (03/28 0419)  Intake/Output from previous day:    Physical Exam: Pt is alert and oriented, NAD HEENT: normal Neck: JVP - normal, carotids 2+= without bruits Lungs: CTA bilaterally CV: RRR without murmur or gallop Abd: soft, NT, Positive BS, no hepatomegaly Ext: no C/C/E, distal pulses intact and equal Skin: warm/dry no rash   Lab Results:  Recent Labs  08/31/12 1110 09/01/12 0435  WBC 4.6 4.8  HGB 11.2* 10.5*  PLT 268 261    Recent Labs  08/31/12 1110 09/01/12 0435  NA 141 140  K 3.7 3.6  CL 104 105  CO2 23 24  GLUCOSE 105* 112*  BUN 13 13  CREATININE 0.81 0.87    Recent Labs  08/31/12 2230 09/01/12 0435  TROPONINI <0.30 <0.30    Cardiac Studies: 2-D echo pending  Tele: Normal sinus rhythm, personally reviewed.  Assessment/Plan:  1. Acute on chronic systolic heart failure. The patient has a markedly elevated pro BNP and clinical symptoms concerning for congestive heart failure. There are not obvious signs of volume overload on physical examination. However, she was noted to desaturate with walking. A CT angiogram of the chest showed no pulmonary embolus. I am going to increase her lisinopril and her Lasix. Otherwise continue current medications without changes.  2. Pericardial Effusion. Followup echocardiogram today.  3. Coronary artery disease status post anterior wall MI. No further ischemic symptoms. Cardiac enzymes are negative. Continue current program.  Tonny Bollman, M.D. 09/01/2012, 1:44 PM

## 2012-09-01 NOTE — Progress Notes (Signed)
Utilization Review Completed.   Alexxia Stankiewicz, RN, BSN Nurse Case Manager  [phone]  

## 2012-09-01 NOTE — Care Management Note (Unsigned)
    Page 1 of 1   09/01/2012     3:41:18 PM   CARE MANAGEMENT NOTE 09/01/2012  Patient:  Wendy Reid, Wendy Reid   Account Number:  [id number]  Date Initiated:  09/01/2012  Documentation initiated by:  Temiloluwa Recchia  Subjective/Objective Assessment:   PT ADM ON 08/31/12 WITH SOB, PERICARDIAL EFFUSION, FRACTURED STERNUM.  PTA, PT INDEPENDENT, LIVES WITH FAMILY.     Action/Plan:   WILL FOLLOW FOR HOME NEEDS AS PT PROGRESSES.   Anticipated DC Date:  09/04/2012   Anticipated DC Plan:  HOME/SELF CARE      DC Planning Services  CM consult      Choice offered to / List presented to:             Status of service:  In process, will continue to follow Medicare Important Message given?   (If response is "NO", the following Medicare IM given date fields will be blank) Date Medicare IM given:   Date Additional Medicare IM given:    Discharge Disposition:    Per UR Regulation:  Reviewed for med. necessity/level of care/duration of stay  If discussed at Long Length of Stay Meetings, dates discussed:    Comments:

## 2012-09-01 NOTE — Discharge Summary (Signed)
Discharge Summary   Patient ID: Wendy Reid,  MRN: [id number], DOB/AGE: 1951/02/12 62 y.o.  Admit date: 08/31/2012 Discharge date: 09/01/2012  Primary Physician: Rosario Adie, MD Primary Cardiologist: Judie Petit. Excell Seltzer, MD  Discharge Diagnoses Principal Problem:   Acute on chronic systolic CHF (congestive heart failure) Active Problems:   History of acute anterior wall myocardial infarction   Coronary artery disease   HTN (hypertension)   GERD (gastroesophageal reflux disease)   Obesity (BMI 30-39.9)   Normocytic anemia   Cough   Fracture of sternum   Allergies Allergies  Allergen Reactions  . Dilaudid (Hydromorphone Hcl) Nausea And Vomiting  . Vicodin (Hydrocodone-Acetaminophen) Nausea And Vomiting  . Zofran Nausea And Vomiting    Diagnostic Studies/Procedures  PA/LATERAL CHEST X-RAY - 08/31/12  IMPRESSION:  Stable cardiomegaly. No acute cardiopulmonary abnormality.  CT-A CHEST - 08/31/12  1. No embolus identified.  2. Abnormal new pericardial fluid, unusual abnormal stranding of  the pericardial fat, and an abnormal transverse band of sclerosis  in the mid sternum with anterior sternal lucent line in this band.  Appearance raises concern for sternal osteomyelitis with spread  into the pericardial space, or possibly a healing sternal stress  fracture from prior cardiopulmonary resuscitation with underlying  pericarditis. Constrictive pericarditis is not excluded. Correlate  with clinical findings of tamponade and consider cardiac echo.  3. Thinned myocardium along the left ventricular apex and apical-  septal region.  2D ECHOCARDIOGRAM - 09/01/12  Normal LV size with EF 25% and wall motion abnormalities as noted above suggesting LAD infarction. Normal RV size and systolic function. Small circumferential pericardial effusion.  History of Present Illness  Wendy Reid is a 62 y.o. female who was admitted to [facility] on 08/31/12 with the  above problem list.   She has a history of CAD - STEMI s/p DES-pLAD in 07/21/12 and acute systolic CHF secondary to ischemic cardiomyopathy (EF 30-35%). She had recurrent pain that admission with relook cath that showed patent stent. She was discharged on a Lifevest with plans to repeat echo in 3 months to determine +/- ICD. She had been feeling well the date of admission until she felt sudden onset shortness of breath without chest pain or associated symptoms thus prompting her ED admission. She had still been wearing the LifeVest. She denies any shocks. She did note that she had a salty meal a few days prior.   There, EKG revealed no evidence of ischemia. Initial trop-I WNL. pBNP returned elevated at 2623. CXR as above revealed no acute cardiopulmonary abnormalities. HR was mildly elevated (95-105) in the ED due to a missed dose of Coreg that day. She ambulated in the ED, and did become hypoxemic with this (83%). She underwent CT-A chest which revealed no evidence of PE. This was personally reviewed by Dr. Elease Hashimoto. As above, this also revealed a pericardial effusion and fractured sternum s/p CPR during her prior admission. There were no signs of tamponade by his evaluation. She was admitted for further evaluation/managment.   Hospital Course   She was continued on home Lasix dosing. 2D echocardiogram as above revealed persistently low EF (25%), multiple WMAs suggesting LAD infarction, normal RV size and systolic function and a small circumferential pericardial effusion. Lasix PO and ACEi were up-titrated. Her symptoms improved. She was evaluated by Dr. Excell Seltzer today who deemed her to be stable for discharge. She will continue the medications outlined below. She will resume wearing her LifeVest. She has been advised to adhere to  a low-sodium diet. She will follow-up with Dr. Excell Seltzer on 09/06/12 as previously scheduled. This information has been clearly outlined in the discharge AVS.   Of note, the patient  endorses a subacute cough since her prior admission. She related this to intubation at first, then seasonal allergies. However, she has been started on a low-dose ACEi and side effect of cough has been discussed on follow-up. She wishes to continue ACEi for now. She has follow-up with Dr. Excell Seltzer where this can be further evaluated and changes made. ARB may be one option. In the meantime, will provide with a prescription of Tussionex.   Discharge Vitals:  Blood pressure 90/60, pulse 81, temperature 97.9 F (36.6 C), temperature source Oral, resp. rate 18, height 5\' 2"  (1.575 m), weight 98.7 kg (217 lb 9.5 oz), SpO2 96.00%.   Labs: Recent Labs     08/31/12  1110  09/01/12  0435  WBC  4.6  4.8  HGB  11.2*  10.5*  HCT  33.6*  31.3*  MCV  85.1  84.4  PLT  268  261    Recent Labs Lab 08/31/12 1110 09/01/12 0435  NA 141 140  K 3.7 3.6  CL 104 105  CO2 23 24  BUN 13 13  CREATININE 0.81 0.87  CALCIUM 10.2 9.7  PROT 8.9*  --   BILITOT 1.0  --   ALKPHOS 109  --   ALT 16  --   AST 17  --   GLUCOSE 105* 112*   Recent Labs     08/31/12  1633  08/31/12  2230  09/01/12  0435  TROPONINI  <0.30  <0.30  <0.30    Recent Labs  08/31/12 1633  TSH 0.518    Disposition:  Discharge Orders   Future Appointments Provider Department Dept Phone   09/04/2012 1:15 PM Mc-Phase2 Monitor [facility] CARDIAC [facility] [phone]   09/06/2012 11:45 AM Tonny Bollman, MD [facility] Main Office [place]) [phone]   09/06/2012 12:00 PM [facility] [employer] Main Office [place]) [phone]   09/06/2012 1:15 PM Mc-Phase2 Monitor 19 [facility] CARDIAC [facility] [phone]   09/08/2012 1:15 PM Mc-Phase2 Monitor 19 [facility] CARDIAC [facility] [phone]   09/11/2012 1:15 PM Mc-Phase2 Monitor 19 [facility] CARDIAC [facility] [phone]   09/13/2012 1:15 PM Mc-Phase2 Monitor 19 [facility] CARDIAC [facility]  [phone]   09/15/2012 1:15 PM Mc-Phase2 Monitor 19 [facility] CARDIAC [facility] [phone]   09/18/2012 1:15 PM Mc-Phase2 Monitor 19 [facility] CARDIAC [facility] [phone]   09/20/2012 1:15 PM Mc-Phase2 Monitor 19 [facility] CARDIAC [facility] [phone]   09/22/2012 1:15 PM Mc-Phase2 Monitor 19 [facility] CARDIAC [facility] [phone]   09/25/2012 1:15 PM Mc-Phase2 Monitor 19 [facility] CARDIAC [facility] [phone]   09/27/2012 1:15 PM Mc-Phase2 Monitor 19 [facility] CARDIAC [facility] [phone]   09/29/2012 1:15 PM Mc-Phase2 Monitor 19 [facility] CARDIAC [facility] [phone]   10/02/2012 1:15 PM Mc-Phase2 Monitor 19 [facility] CARDIAC [facility] [phone]   10/04/2012 1:15 PM Mc-Phase2 Monitor 19 [facility] CARDIAC [facility] [phone]   10/06/2012 1:15 PM Mc-Phase2 Monitor 19 [facility] CARDIAC [facility] [phone]   10/09/2012 1:15 PM Mc-Phase2 Monitor 19 [facility] CARDIAC [facility] [phone]   10/11/2012 1:15 PM Mc-Phase2 Monitor 19 [facility] CARDIAC [facility] [phone]   10/13/2012 1:15 PM Mc-Phase2  Monitor 19 [facility] CARDIAC [facility] [phone]   10/16/2012 1:15 PM Mc-Phase2 Monitor 19 [facility] CARDIAC [facility] [phone]   10/18/2012 11:30 AM [facility]cho Echo 1 [facility] SITE 3 ECHO LAB [phone]   10/18/2012 1:15 PM Mc-Phase2 Monitor 19 [facility] CARD[facility] [phone]   10/20/2012 1:15 PM Mc-Phase2 Monitor 19 [facility] CARDIAC [facility] [phone]   10/23/2012 1:15 PM Mc-Phase2 Monitor 19 [facility] CARDIAC [facility] [phone]   10/25/2012 1:15 PM Mc-Phase2 Monitor 19 [facility] CARDIAC [facility] [phone]   10/27/2012 1:15 PM Mc-Phase2 Monitor 19 [facility] CARDIAC  [facility] [phone]   10/30/2012 1:15 PM Mc-Phase2 Monitor 19 [facility] CARDIAC [facility] [phone]   11/01/2012 1:15 PM Mc-Phase2 Monitor 19 [facility] CARDIAC [facility] [phone]   11/03/2012 1:15 PM Mc-Phase2 Monitor 19 [facility] CARDIAC [facility] [phone]   11/06/2012 1:15 PM Mc-Phase2 Monitor 19 [facility] CARDIAC [facility] [phone]   11/08/2012 1:15 PM Mc-Phase2 Monitor 19 [facility] CARDIAC [facility] [phone]   11/10/2012 1:15 PM Mc-Phase2 Monitor 19 [facility] CARDIAC [facility] [phone]   11/13/2012 1:15 PM Mc-Phase2 Monitor 19 [facility] CARDIAC [facility] [phone]   11/15/2012 1:15 PM Mc-Phase2 Monitor 19 [facility] CARDI[facility] [phone]   11/17/2012 1:15 PM Mc-Phase2 Monitor 19 [facility] CARDIAC [facility] [phone]   11/20/2012 1:15 PM Mc-Phase2 Monitor 19 [facility] CARDIAC [facility] [phone]   11/22/2012 1:15 PM Mc-Phase2 Monitor 19 [facility] CARDIAC [facility] [phone]   11/24/2012 1:15 PM Mc-Phase2 Monitor 19 [facility] CARDIAC [facility] [phone]   11/27/2012 1:15 PM Mc-Phase2 Monitor 19 [facility] CARDIAC [facility] [phone]   11/29/2012 1:15 PM Mc-Phase2 Monitor 19 [facility] CARDIAC [facility] [phone]   12/01/2012 1:15 PM Mc-Phase2 Monitor 19 [facility] CARDIAC [facility] [phone]   12/04/2012 1:15 PM Mc-Phase2 Monitor 19 [facility] CARDIAC [facility] [phone]   12/06/2012 1:15 PM Mc-Phase2 Monitor 19 [facility] CARDIAC RE[facility] [phone]   Future Orders Complete By Expires     Diet - low sodium heart healthy  As directed     Increase activity slowly  As directed           Follow-up Information   Follow up with Tonny Bollman, MD On 09/06/2012. (At 11:45 AM as previously scheduled. )    Contact  information:   [address] [place] [address] [phone]       Discharge Medications:    Medication List    TAKE these medications       albuterol (2.5 MG/3ML) 0.083% nebulizer solution  Commonly known as:  PROVENTIL  Take 2.5 mg by nebulization every 4 (four) hours as needed for wheezing or shortness of breath.     albuterol 108 (90 BASE) MCG/ACT inhaler  Commonly known as:  PROVENTIL HFA;VENTOLIN HFA  Inhale 2 puffs into the lungs every 4 (four) hours as needed for wheezing or shortness of breath.     aspirin 81 MG chewable tablet  Chew 81 mg by mouth daily.     atorvastatin 80 MG tablet  Commonly known as:  LIPITOR  Take 80 mg by mouth daily.     carvedilol 12.5 MG tablet  Commonly known as:  COREG  Take 12.5 mg by mouth 2 (two)  times daily.     chlorpheniramine-HYDROcodone 10-8 MG/5ML Lqcr  Commonly known as:  TUSSIONEX PENNKINETIC ER  Take 5 mLs by mouth every 12 (twelve) hours as needed.     chlorpheniramine-HYDROcodone 10-8 MG/5ML Lqcr  Commonly known as:  TUSSIONEX  Take 5 mLs by mouth every 8 (eight) hours as needed (for cough).     fexofenadine 180 MG tablet  Commonly known as:  ALLEGRA  Take 180 mg by mouth daily as needed (for allergies).     fluticasone 50 MCG/ACT nasal spray  Commonly known as:  FLONASE  Place 1 spray into the nose daily.     furosemide 40 MG tablet  Commonly known as:  LASIX  Take 1 tablet (40 mg total) by mouth daily.     lisinopril 2.5 MG tablet  Commonly known as:  PRINIVIL,ZESTRIL  Take 1 tablet (2.5 mg total) by mouth daily.  Start taking on:  09/02/2012     nitroGLYCERIN 0.4 MG SL tablet  Commonly known as:  NITROSTAT  Place 0.4 mg under the tongue every 5 (five) minutes as needed for chest pain.     oxyCODONE 5 MG immediate release tablet  Commonly known as:  Oxy IR/ROXICODONE  Take 5 mg by mouth every 6 (six) hours as needed for pain.     pantoprazole 40 MG tablet  Commonly known as:   PROTONIX  Take 40 mg by mouth daily with breakfast.     prasugrel 10 MG Tabs  Commonly known as:  EFFIENT  Take 10 mg by mouth daily.       Outstanding Labs/Studies: Lipid panel, LFTs on 09/06/12  Duration of Discharge Encounter: Greater than 30 minutes including physician time.  Signed, R. Hurman Horn, PA-C 09/01/2012, 4:19 PM

## 2012-09-04 ENCOUNTER — Encounter (HOSPITAL_COMMUNITY): Payer: PRIVATE HEALTH INSURANCE

## 2012-09-05 ENCOUNTER — Telehealth: Payer: Self-pay | Admitting: *Deleted

## 2012-09-05 NOTE — Telephone Encounter (Signed)
TCM patient. Called to check status. Advised that she is due for lab work and office visit tomorrow with Dr.Cooper. She was not aware of lab appointment and since appointment is close to noon she will just eat a light breakfast. Will discuss issue with cough and BP medication at appointment tomorrow.

## 2012-09-06 ENCOUNTER — Encounter (HOSPITAL_COMMUNITY): Payer: PRIVATE HEALTH INSURANCE

## 2012-09-06 ENCOUNTER — Encounter: Payer: Self-pay | Admitting: Cardiovascular Disease

## 2012-09-06 ENCOUNTER — Ambulatory Visit (INDEPENDENT_AMBULATORY_CARE_PROVIDER_SITE_OTHER): Payer: PRIVATE HEALTH INSURANCE | Admitting: Cardiovascular Disease

## 2012-09-06 ENCOUNTER — Other Ambulatory Visit (INDEPENDENT_AMBULATORY_CARE_PROVIDER_SITE_OTHER): Payer: PRIVATE HEALTH INSURANCE

## 2012-09-06 VITALS — BP 104/72 | HR 68 | Ht 61.5 in | Wt 216.0 lb

## 2012-09-06 DIAGNOSIS — I5022 Chronic systolic (congestive) heart failure: Secondary | ICD-10-CM

## 2012-09-06 DIAGNOSIS — R0989 Other specified symptoms and signs involving the circulatory and respiratory systems: Secondary | ICD-10-CM

## 2012-09-06 MED ORDER — LOSARTAN POTASSIUM 25 MG PO TABS: 25.0000 mg | ORAL_TABLET | Freq: Every day | ORAL | Status: DC

## 2012-09-06 NOTE — Patient Instructions (Addendum)
Your physician has recommended you make the following change in your medication: STOP Lisinopril, START Losartan 25mg  take one by mouth daily  Your physician recommends that you return for lab work in: 2 WEEKS (BMP and BNP)  Your physician recommends that you schedule a follow-up appointment in: May with Dr Excell Seltzer to review ECHO results  You have been referred to Dietician.

## 2012-09-07 ENCOUNTER — Ambulatory Visit (HOSPITAL_COMMUNITY): Payer: PRIVATE HEALTH INSURANCE

## 2012-09-07 ENCOUNTER — Encounter: Payer: Self-pay | Admitting: Cardiovascular Disease

## 2012-09-07 NOTE — Progress Notes (Signed)
HPI:   62 year old woman presenting for followup evaluation. The patient initially presented with an acute anterior wall MI complicated by ventricular fibrillation cardiac arrest in February. She was treated with primary PCI. She was discharged home with a lifevest for prevention of sudden cardiac death. She was readmitted the first time February 24 with recurrent chest pain. This was atypical and she was discharged on the following day. She was readmitted again last week with an episode of shortness of breath. This was an isolated event and she otherwise has done well. However, it was noted that her pro BNP was markedly elevated. Her diuretic dose was increased. An echocardiogram was repeated and it demonstrated persistent severe LV dysfunction with an ejection fraction estimated at 20-25%.  The patient is feeling much better. She denies recurrent chest pain or shortness of breath. She does complain of a cough. There is some sputum production. She's had no fever or chills. She denies orthopnea, PND, or leg swelling. She is eager to start cardiac rehabilitation.  Outpatient Encounter Prescriptions as of 09/06/2012  Medication Sig Dispense Refill  . albuterol (PROVENTIL HFA;VENTOLIN HFA) 108 (90 BASE) MCG/ACT inhaler Inhale 2 puffs into the lungs every 4 (four) hours as needed for wheezing or shortness of breath.      Marland Kitchen albuterol (PROVENTIL) (2.5 MG/3ML) 0.083% nebulizer solution Take 2.5 mg by nebulization every 4 (four) hours as needed for wheezing or shortness of breath.      Marland Kitchen aspirin 81 MG chewable tablet Chew 81 mg by mouth daily.      Marland Kitchen atorvastatin (LIPITOR) 80 MG tablet Take 80 mg by mouth daily.      . carvedilol (COREG) 12.5 MG tablet Take 12.5 mg by mouth 2 (two) times daily.      . chlorpheniramine-HYDROcodone (TUSSIONEX PENNKINETIC ER) 10-8 MG/5ML LQCR Take 5 mLs by mouth every 12 (twelve) hours as needed.  140 mL  0  . fexofenadine (ALLEGRA) 180 MG tablet Take 180 mg by mouth daily as  needed (for allergies).      . fluticasone (FLONASE) 50 MCG/ACT nasal spray Place 1 spray into the nose daily.      . furosemide (LASIX) 40 MG tablet Take 1 tablet (40 mg total) by mouth daily.  30 tablet  3  . nitroGLYCERIN (NITROSTAT) 0.4 MG SL tablet Place 0.4 mg under the tongue every 5 (five) minutes as needed for chest pain.      Marland Kitchen oxyCODONE (OXY IR/ROXICODONE) 5 MG immediate release tablet Take 5 mg by mouth every 6 (six) hours as needed for pain.      . pantoprazole (PROTONIX) 40 MG tablet Take 40 mg by mouth daily with breakfast.       . prasugrel (EFFIENT) 10 MG TABS Take 10 mg by mouth daily.      . [DISCONTINUED] lisinopril (PRINIVIL,ZESTRIL) 2.5 MG tablet Take 1 tablet (2.5 mg total) by mouth daily.  30 tablet  3  . losartan (COZAAR) 25 MG tablet Take 1 tablet (25 mg total) by mouth daily.  30 tablet  11  . [DISCONTINUED] chlorpheniramine-HYDROcodone (TUSSIONEX) 10-8 MG/5ML LQCR Take 5 mLs by mouth every 8 (eight) hours as needed (for cough).       No facility-administered encounter medications on file as of 09/06/2012.    Allergies  Allergen Reactions  . Dilaudid (Hydromorphone Hcl) Nausea And Vomiting  . Vicodin (Hydrocodone-Acetaminophen) Nausea And Vomiting  . Zofran Nausea And Vomiting    Past Medical History  Diagnosis Date  .  Asthma   . GERD (gastroesophageal reflux disease)   . Hypertension   . 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 Barrett's esophagus with esophagitis 11/01/2011    Patient states no evidence of barrett's from last surveillance EGD on 11/2010  . Stress bladder incontinence, female 11/01/2011  . 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 Complex regional pain syndrome of lower limb   . Coronary artery disease     STEMI 07/2012 Cath totally occluded prox LAD s/p DES, otherwise nonobstructive dz in RCA and LCx, EF 30%  . Systolic CHF     a. Echo 07/22/12 EF 30-35%, grade 1 diastolic dysfunction, akinesis of the mid-distalanterior and  apical myocardium b. Echo 09/01/12: EF 25%, multiple WMAs suggesting LAD infarction, normal RV size and systolic function and a small circumferential pericardial effusion  . Cardiac arrest     V.Fib arrest 07/2012 in the setting of acute anterior STEMI; Discharged with Life[facility]  . Hyperlipidemia   . Environmental allergies     Uses albuterol due to wheezing.  . Ischemic cardiomyopathy     ROS: Negative except as per HPI  BP 104/72  Pulse 68  Ht 5' 1.5" (1.562 m)  Wt 97.977 kg (216 lb)  BMI 40.16 kg/m2  PHYSICAL EXAM: Pt is alert and oriented, pleasant overweight woman in NAD HEENT: normal Neck: JVP - normal, carotids 2+= without bruits Lungs: CTA bilaterally CV: RRR without murmur or gallop Abd: soft, NT, Positive BS, no hepatomegaly Ext: no C/C/E, distal pulses intact and equal Skin: warm/dry no rash  Echo: 09/01/2012 Left ventricle: The cavity size was normal. Wall thickness was normal. The estimated ejection fraction was 25%. Mid to apical anteroseptal and inferoseptal akinesis, mid to apical anterior akinesis, apical lateral and apical inferior akinesis, akinesis of the true apex. No LV thrombus noted. Doppler parameters are consistent with abnormal left ventricular relaxation (grade 1 diastolic dysfunction).  ------------------------------------------------------------ Aortic valve: Trileaflet. Doppler: There was no stenosis. No regurgitation.  ------------------------------------------------------------ Aorta: Aortic root: The aortic root was normal in size. Ascending aorta: The ascending aorta was normal in size.  ------------------------------------------------------------ Mitral valve: Mildly calcified annulus. Doppler: There was no evidence for stenosis. Trivial regurgitation. Peak gradient: 2mm Hg (D).  ------------------------------------------------------------ Left atrium: The atrium was normal in  size.  ------------------------------------------------------------ Right ventricle: The cavity size was normal. Systolic function was normal.  ------------------------------------------------------------ Pulmonic valve: Structurally normal valve. Cusp separation was normal. Doppler: Transvalvular velocity was within the normal range. Trivial regurgitation.  ------------------------------------------------------------ Tricuspid valve: Doppler: Trivial regurgitation.  ------------------------------------------------------------ Right atrium: The atrium was normal in size.  ------------------------------------------------------------ Pericardium: Small circumferential pericardial effusion.  ------------------------------------------------------------  2D measurements Normal Doppler Normal Left ventricle measurements LVID ED, 54 mm 43-52 Main pulmonary chord, artery PLAX Pressure, S 26 mm =30 LVID ES, 44 mm 23-38 Hg chord, Left ventricle PLAX Ea, med 4 cm/ ------- FS, chord, 19 % >29 ann, tiss s PLAX DP LVPW, ED 10 mm ------ E/Ea, med 19.13 ------- IVS/LVPW 1 <1.3 ann, tiss ratio, ED DP Ventricular septum Mitral valve IVS, ED 10 mm ------ Peak E vel 76.5 cm/ ------- Aorta s Root diam, 30 mm ------ Peak A vel 112 cm/ ------- ED s Left atrium Deceleratio 155 ms 150-230 AP dim 32 mm ------ n time AP dim 1.5 cm/m^2 <2.2 Peak 2 mm ------- index gradient, D Hg Peak E/A 0.7 ------- ratio Tricuspid valve Regurg peak 231 cm/ ------- vel s Peak RV-RA 21 mm ------- gradient,  S Hg  ASSESSMENT AND PLAN: 1. Chronic systolic heart failure secondary to underlying ischemic cardiomyopathy. Considering her cough, I am going to change her lisinopril to losartan 25 mg daily. She will continue on her current dose of carvedilol. Depending on her blood pressure tolerance of losartan, will consider adding Aldactone at her next office visit.  2. Coronary artery disease status post anterior  wall MI. She will remain on dual antiplatelet therapy with aspirin and effient. She is having no recurrent anginal symptoms.  3. Severe ischemic cardiomyopathy. She is scheduled for a three-month echocardiogram. I will see her back after that test is completed. I am doubtful that her LV function will improve and suspect she will require an ICD for primary prevention of sudden cardiac death. For now she will continues with her lifevest.  She will return for followup in about 6 weeks after her echocardiogram is completed. I think she is stable to begin participation in outpatient cardiac rehabilitation.  Tonny Bollman 09/07/2012 12:41 PM

## 2012-09-08 ENCOUNTER — Encounter (HOSPITAL_COMMUNITY): Payer: PRIVATE HEALTH INSURANCE

## 2012-09-08 ENCOUNTER — Telehealth: Payer: Self-pay | Admitting: Cardiovascular Disease

## 2012-09-08 NOTE — Telephone Encounter (Signed)
New Problem:    Patient called in wanting to know what the recovery time was for a pacemaker insertion.  Please call back.

## 2012-09-08 NOTE — Telephone Encounter (Signed)
Called patient back. She may have an AICD implanted depending on her EF in 3 months per last MD note. Advised her that recovery is 10 to 14 days but if lifting can be 3 months or more. She verbalized understanding.

## 2012-09-08 NOTE — Telephone Encounter (Signed)
Left message for pt that phones have been down and we will attempt to contact her again on Monday.  (I don't see any documentation about her being considered for a pacer insertion and no appt have been scheduled to see EP)

## 2012-09-11 ENCOUNTER — Encounter (HOSPITAL_COMMUNITY): Payer: PRIVATE HEALTH INSURANCE

## 2012-09-13 ENCOUNTER — Encounter (HOSPITAL_COMMUNITY): Payer: PRIVATE HEALTH INSURANCE

## 2012-09-14 ENCOUNTER — Encounter (HOSPITAL_COMMUNITY)
Admission: RE | Admit: 2012-09-14 | Discharge: 2012-09-14 | Disposition: A | Discharge status: Home / Self Care | Payer: PRIVATE HEALTH INSURANCE | Source: Ambulatory Visit | Attending: Cardiovascular Disease | Admitting: Cardiovascular Disease

## 2012-09-14 DIAGNOSIS — I252 Old myocardial infarction: Secondary | ICD-10-CM | POA: Insufficient documentation

## 2012-09-14 DIAGNOSIS — I2510 Atherosclerotic heart disease of native coronary artery without angina pectoris: Secondary | ICD-10-CM | POA: Insufficient documentation

## 2012-09-14 DIAGNOSIS — E785 Hyperlipidemia, unspecified: Secondary | ICD-10-CM | POA: Insufficient documentation

## 2012-09-14 DIAGNOSIS — Z5189 Encounter for other specified aftercare: Secondary | ICD-10-CM | POA: Insufficient documentation

## 2012-09-14 DIAGNOSIS — I259 Chronic ischemic heart disease, unspecified: Secondary | ICD-10-CM | POA: Insufficient documentation

## 2012-09-14 NOTE — Progress Notes (Signed)
Cardiac Rehab Medication Review by a Pharmacist  Does the patient  feel that his/her medications are working for him/her?  yes  Has the patient been experiencing any side effects to the medications prescribed?  Yes; experienced cough with lisinopril, not having with losartan  Does the patient measure his/her own blood pressure or blood glucose at home?  yes   Does the patient have any problems obtaining medications due to transportation or finances?   no  Understanding of regimen: excellent Understanding of indications: good Potential of compliance: excellent   Wendy Reid 09/14/2012 8:39 AM

## 2012-09-15 ENCOUNTER — Encounter (HOSPITAL_COMMUNITY): Payer: PRIVATE HEALTH INSURANCE

## 2012-09-18 ENCOUNTER — Encounter (HOSPITAL_COMMUNITY): Payer: Self-pay

## 2012-09-18 ENCOUNTER — Encounter (HOSPITAL_COMMUNITY)
Admission: RE | Admit: 2012-09-18 | Discharge: 2012-09-18 | Disposition: A | Discharge status: Home / Self Care | Payer: PRIVATE HEALTH INSURANCE | Source: Ambulatory Visit | Attending: Cardiovascular Disease | Admitting: Cardiovascular Disease

## 2012-09-18 NOTE — Progress Notes (Signed)
Pt started cardiac rehab today.  Pt tolerated light exercise without difficulty.  Asymptomatic, VSS, telemetry-sinus rhythm, negative QRS.  Pt oriented to exercise equipment and routine.  Pt reports episode of tachycardiac with left arm pain last night at rest, rates 6/10.   Pt states she took NTG SL x1 with minimal relief. Pt reports she called ED, spoke to charge nurse who according to pt reviewed her Zoll tracing and reassured pt.    Will review symptoms with Dr Excell Seltzer.   Understanding verbalized.

## 2012-09-20 ENCOUNTER — Encounter (HOSPITAL_COMMUNITY)
Admission: RE | Admit: 2012-09-20 | Discharge: 2012-09-20 | Disposition: A | Discharge status: Home / Self Care | Payer: PRIVATE HEALTH INSURANCE | Source: Ambulatory Visit | Attending: Cardiovascular Disease | Admitting: Cardiovascular Disease

## 2012-09-20 ENCOUNTER — Other Ambulatory Visit: Payer: PRIVATE HEALTH INSURANCE

## 2012-09-22 ENCOUNTER — Encounter (HOSPITAL_COMMUNITY)
Admission: RE | Admit: 2012-09-22 | Discharge: 2012-09-22 | Disposition: A | Discharge status: Home / Self Care | Payer: PRIVATE HEALTH INSURANCE | Source: Ambulatory Visit | Attending: Cardiovascular Disease | Admitting: Cardiovascular Disease

## 2012-09-22 ENCOUNTER — Ambulatory Visit (INDEPENDENT_AMBULATORY_CARE_PROVIDER_SITE_OTHER): Payer: PRIVATE HEALTH INSURANCE | Admitting: *Deleted

## 2012-09-22 DIAGNOSIS — I5022 Chronic systolic (congestive) heart failure: Secondary | ICD-10-CM

## 2012-09-22 LAB — BASIC METABOLIC PANEL
BUN: 16 mg/dL (ref 6–23)
Creat: 0.93 mg/dL (ref 0.50–1.10)

## 2012-09-23 LAB — BRAIN NATRIURETIC PEPTIDE: Brain Natriuretic Peptide: 163.6 pg/mL — ABNORMAL HIGH (ref 0.0–100.0)

## 2012-09-25 ENCOUNTER — Encounter (HOSPITAL_COMMUNITY)
Admission: RE | Admit: 2012-09-25 | Discharge: 2012-09-25 | Disposition: A | Discharge status: Home / Self Care | Payer: PRIVATE HEALTH INSURANCE | Source: Ambulatory Visit | Attending: Cardiovascular Disease | Admitting: Cardiovascular Disease

## 2012-09-25 NOTE — Progress Notes (Signed)
1350- Home exercise guidelines reviewed with patient by academic intern, Atha Starks including endpoints, temperature precautions, target heart rate and rate of perceived exertion. Pt plans to walk as her mode of home exercise. Pt voices understanding of instructions given.  Cristy Hilts, MS, ACSM CES

## 2012-09-27 ENCOUNTER — Encounter: Payer: Self-pay | Admitting: Cardiovascular Disease

## 2012-09-27 ENCOUNTER — Encounter (HOSPITAL_COMMUNITY)
Admission: RE | Admit: 2012-09-27 | Discharge: 2012-09-27 | Disposition: A | Discharge status: Home / Self Care | Payer: PRIVATE HEALTH INSURANCE | Source: Ambulatory Visit | Attending: Cardiovascular Disease | Admitting: Cardiovascular Disease

## 2012-09-29 ENCOUNTER — Encounter (HOSPITAL_COMMUNITY)
Admission: RE | Admit: 2012-09-29 | Discharge: 2012-09-29 | Disposition: A | Discharge status: Home / Self Care | Payer: PRIVATE HEALTH INSURANCE | Source: Ambulatory Visit | Attending: Cardiovascular Disease | Admitting: Cardiovascular Disease

## 2012-10-02 ENCOUNTER — Encounter (HOSPITAL_COMMUNITY): Payer: PRIVATE HEALTH INSURANCE

## 2012-10-04 ENCOUNTER — Encounter (HOSPITAL_COMMUNITY)
Admission: RE | Admit: 2012-10-04 | Discharge: 2012-10-04 | Disposition: A | Discharge status: Home / Self Care | Payer: PRIVATE HEALTH INSURANCE | Source: Ambulatory Visit | Attending: Cardiovascular Disease | Admitting: Cardiovascular Disease

## 2012-10-04 NOTE — Progress Notes (Signed)
Wendy Reid 62 y.o. female Nutrition Note Spoke with pt.  Nutrition Plan and cholesterol goals reviewed with pt. Pt plans on returning [employer] Friday 10/06/12. Pt wants to lose wt. Pt has been trying to lose wt by "changing my diet completely." Wt loss tips reviewed.  Pt has CHF and is watching sodium intake. Pt expressed understanding of the information reviewed. Pt aware of nutrition education classes offered and plans on attending nutrition classes.  Nutrition Diagnosis   Food-and nutrition-related knowledge deficit related to lack of exposure to information as related to diagnosis of: ? CVD ?    Obesity related to excessive energy intake as evidenced by a BMI of 41.9  Nutrition RX/ Estimated Daily Nutrition Needs for: wt loss  1300-1800 Kcal, 30-45 gm fat, 8-14 gm sat fat, 1.2-1.8 gm trans-fat, <1500 mg sodium  Nutrition Intervention   Pt's individual nutrition plan reviewed with pt.   Pt to attend the Portion Distortion class - met 09/27/12   Pt to attend the  ? Nutrition I class                     ? Nutrition II class   Continue client-centered nutrition education by RD, as part of interdisciplinary care. Goal(s)   Pt to identify and limit food sources of saturated fat, trans fat, and cholesterol   Pt to identify food quantities necessary to achieve: ? wt loss to a goal wt of 197-215 lb (89.6-97.8 kg) at graduation from cardiac rehab.  Monitor and Evaluate progress toward nutrition goal with team. Nutrition Risk: High   Mickle Plumb, M.Ed, RD, LDN, CDE 10/04/2012 2:36 PM

## 2012-10-05 ENCOUNTER — Telehealth: Payer: Self-pay | Admitting: Cardiovascular Disease

## 2012-10-05 NOTE — Telephone Encounter (Signed)
New problem   Pt has been coughing and need some medication for that . Please call pt

## 2012-10-05 NOTE — Telephone Encounter (Signed)
Not sure. PCP is really better to advise on this.

## 2012-10-05 NOTE — Telephone Encounter (Signed)
Pt advised to talk to PCP regarding cough medicine.

## 2012-10-05 NOTE — Telephone Encounter (Signed)
Pt c/o continued cough since intubation in February. Weight has stayed the same, no edema, pt is in cardiac rehab. Requested a cough medication. Told her she could call her pcp but I will forward a note to Dr Excell Seltzer, pt would appreciate a suggestion.

## 2012-10-06 ENCOUNTER — Encounter (HOSPITAL_COMMUNITY)
Admission: RE | Admit: 2012-10-06 | Discharge: 2012-10-06 | Disposition: A | Discharge status: Home / Self Care | Payer: PRIVATE HEALTH INSURANCE | Source: Ambulatory Visit | Attending: Cardiovascular Disease | Admitting: Cardiovascular Disease

## 2012-10-06 DIAGNOSIS — I259 Chronic ischemic heart disease, unspecified: Secondary | ICD-10-CM | POA: Insufficient documentation

## 2012-10-06 DIAGNOSIS — Z5189 Encounter for other specified aftercare: Secondary | ICD-10-CM | POA: Insufficient documentation

## 2012-10-06 DIAGNOSIS — I252 Old myocardial infarction: Secondary | ICD-10-CM | POA: Insufficient documentation

## 2012-10-06 DIAGNOSIS — E785 Hyperlipidemia, unspecified: Secondary | ICD-10-CM | POA: Insufficient documentation

## 2012-10-06 DIAGNOSIS — I2510 Atherosclerotic heart disease of native coronary artery without angina pectoris: Secondary | ICD-10-CM | POA: Insufficient documentation

## 2012-10-09 ENCOUNTER — Encounter (HOSPITAL_COMMUNITY)
Admission: RE | Admit: 2012-10-09 | Discharge: 2012-10-09 | Disposition: A | Discharge status: Home / Self Care | Payer: PRIVATE HEALTH INSURANCE | Source: Ambulatory Visit | Attending: Cardiovascular Disease | Admitting: Cardiovascular Disease

## 2012-10-11 ENCOUNTER — Encounter (HOSPITAL_COMMUNITY)
Admission: RE | Admit: 2012-10-11 | Discharge: 2012-10-11 | Disposition: A | Discharge status: Home / Self Care | Payer: PRIVATE HEALTH INSURANCE | Source: Ambulatory Visit | Attending: Cardiovascular Disease | Admitting: Cardiovascular Disease

## 2012-10-11 NOTE — Progress Notes (Signed)
Wendy Reid 62 y.o. female Nutrition Note Spoke with pt.  Nutrition Survey reviewed with pt. Pt is following step 2 of the Therapeutic Lifestyle Changes diet. Pt expressed understanding of the information reviewed. Pt aware of nutrition education classes offered and plans on attending nutrition classes.  Nutrition Diagnosis   Food-and nutrition-related knowledge deficit related to lack of exposure to information as related to diagnosis of: ? CVD    Obesity related to excessive energy intake as evidenced by a BMI of 41.9  Nutrition RX/ Estimated Daily Nutrition Needs for: wt loss  1300-1800 Kcal, 30-45 gm fat, 8-14 gm sat fat, 1.2-1.8 gm trans-fat, <1500 mg sodium  Nutrition Intervention   Benefits of adopting Therapeutic Lifestyle Changes discussed when Medficts reviewed.   Pt to attend the Portion Distortion class - met 09/27/12   Pt to attend the  ? Nutrition I class                     ? Nutrition II class   Continue client-centered nutrition education by RD, as part of interdisciplinary care. Goal(s)   Pt to identify and limit food sources of saturated fat, trans fat, and cholesterol   Pt to identify food quantities necessary to achieve: ? wt loss to a goal wt of 197-215 lb (89.6-97.8 kg) at graduation from cardiac rehab.  Monitor and Evaluate progress toward nutrition goal with team. Nutrition Risk: High change to Moderate risk  Mickle Plumb, M.Ed, RD, LDN, CDE 10/11/2012 2:22 PM

## 2012-10-13 ENCOUNTER — Encounter (HOSPITAL_COMMUNITY): Payer: PRIVATE HEALTH INSURANCE

## 2012-10-16 ENCOUNTER — Encounter (HOSPITAL_COMMUNITY)
Admission: RE | Admit: 2012-10-16 | Discharge: 2012-10-16 | Disposition: A | Discharge status: Home / Self Care | Payer: PRIVATE HEALTH INSURANCE | Source: Ambulatory Visit | Attending: Cardiovascular Disease | Admitting: Cardiovascular Disease

## 2012-10-18 ENCOUNTER — Encounter (HOSPITAL_COMMUNITY)
Admission: RE | Admit: 2012-10-18 | Discharge: 2012-10-18 | Disposition: A | Discharge status: Home / Self Care | Payer: PRIVATE HEALTH INSURANCE | Source: Ambulatory Visit | Attending: Cardiovascular Disease | Admitting: Cardiovascular Disease

## 2012-10-18 ENCOUNTER — Ambulatory Visit (HOSPITAL_COMMUNITY): Payer: PRIVATE HEALTH INSURANCE | Attending: Family Medicine | Admitting: Radiology

## 2012-10-18 DIAGNOSIS — C50919 Malignant neoplasm of unspecified site of unspecified female breast: Secondary | ICD-10-CM | POA: Insufficient documentation

## 2012-10-18 DIAGNOSIS — I252 Old myocardial infarction: Secondary | ICD-10-CM | POA: Insufficient documentation

## 2012-10-18 DIAGNOSIS — I379 Nonrheumatic pulmonary valve disorder, unspecified: Secondary | ICD-10-CM | POA: Insufficient documentation

## 2012-10-18 DIAGNOSIS — R0609 Other forms of dyspnea: Secondary | ICD-10-CM | POA: Insufficient documentation

## 2012-10-18 DIAGNOSIS — I509 Heart failure, unspecified: Secondary | ICD-10-CM | POA: Insufficient documentation

## 2012-10-18 DIAGNOSIS — I2510 Atherosclerotic heart disease of native coronary artery without angina pectoris: Secondary | ICD-10-CM | POA: Insufficient documentation

## 2012-10-18 DIAGNOSIS — I2589 Other forms of chronic ischemic heart disease: Secondary | ICD-10-CM

## 2012-10-18 DIAGNOSIS — I10 Essential (primary) hypertension: Secondary | ICD-10-CM | POA: Insufficient documentation

## 2012-10-18 DIAGNOSIS — I059 Rheumatic mitral valve disease, unspecified: Secondary | ICD-10-CM | POA: Insufficient documentation

## 2012-10-18 DIAGNOSIS — R05 Cough: Secondary | ICD-10-CM | POA: Insufficient documentation

## 2012-10-18 DIAGNOSIS — I2109 ST elevation (STEMI) myocardial infarction involving other coronary artery of anterior wall: Secondary | ICD-10-CM

## 2012-10-18 DIAGNOSIS — R059 Cough, unspecified: Secondary | ICD-10-CM | POA: Insufficient documentation

## 2012-10-18 DIAGNOSIS — R0989 Other specified symptoms and signs involving the circulatory and respiratory systems: Secondary | ICD-10-CM | POA: Insufficient documentation

## 2012-10-18 DIAGNOSIS — E785 Hyperlipidemia, unspecified: Secondary | ICD-10-CM | POA: Insufficient documentation

## 2012-10-18 DIAGNOSIS — I079 Rheumatic tricuspid valve disease, unspecified: Secondary | ICD-10-CM | POA: Insufficient documentation

## 2012-10-18 NOTE — Progress Notes (Signed)
Echocardiogram performed.  

## 2012-10-19 ENCOUNTER — Encounter: Payer: Self-pay | Admitting: Physician Assistant

## 2012-10-19 ENCOUNTER — Other Ambulatory Visit: Payer: Self-pay | Admitting: *Deleted

## 2012-10-20 ENCOUNTER — Ambulatory Visit (INDEPENDENT_AMBULATORY_CARE_PROVIDER_SITE_OTHER): Payer: PRIVATE HEALTH INSURANCE | Admitting: Cardiovascular Disease

## 2012-10-20 ENCOUNTER — Encounter (HOSPITAL_COMMUNITY)
Admission: RE | Admit: 2012-10-20 | Discharge: 2012-10-20 | Disposition: A | Discharge status: Home / Self Care | Payer: PRIVATE HEALTH INSURANCE | Source: Ambulatory Visit | Attending: Cardiovascular Disease | Admitting: Cardiovascular Disease

## 2012-10-20 ENCOUNTER — Encounter: Payer: Self-pay | Admitting: Cardiovascular Disease

## 2012-10-20 VITALS — BP 126/80 | HR 81 | Ht 61.5 in | Wt 221.0 lb

## 2012-10-20 DIAGNOSIS — R05 Cough: Secondary | ICD-10-CM

## 2012-10-20 DIAGNOSIS — I5022 Chronic systolic (congestive) heart failure: Secondary | ICD-10-CM

## 2012-10-20 MED ORDER — HYDROCOD POLST-CHLORPHEN POLST 10-8 MG/5ML PO LQCR: 5.0000 mL | Freq: Two times a day (BID) | ORAL | Status: DC | PRN

## 2012-10-20 MED ORDER — SPIRONOLACTONE 25 MG PO TABS
12.5000 mg | ORAL_TABLET | Freq: Every day | ORAL | Status: DC
Start: 2012-10-20 — End: 2013-11-10

## 2012-10-20 NOTE — Patient Instructions (Addendum)
You have been referred to Electrophysiology for ICD evaluation  Your physician has recommended you make the following change in your medication: START Spirinolactone 25mg  take one-half tablet by mouth daily  Your physician recommends that you return for lab work in: 2 WEEKS (BMP)  Your physician recommends that you schedule a follow-up appointment in: 3 MONTHS with Dr Excell Seltzer

## 2012-10-23 ENCOUNTER — Encounter (HOSPITAL_COMMUNITY)
Admission: RE | Admit: 2012-10-23 | Discharge: 2012-10-23 | Disposition: A | Discharge status: Home / Self Care | Payer: PRIVATE HEALTH INSURANCE | Source: Ambulatory Visit | Attending: Cardiovascular Disease | Admitting: Cardiovascular Disease

## 2012-10-24 ENCOUNTER — Encounter: Payer: Self-pay | Admitting: Cardiovascular Disease

## 2012-10-24 NOTE — Progress Notes (Signed)
HPI:  62 year old woman presenting for followup evaluation. The patient initially presented with an acute anterior wall MI complicated by ventricular fibrillation cardiac arrest in February. She was treated with primary PCI. She was discharged home with a lifevest for prevention of sudden cardiac death. She has been rehospitalized twice, the first time for atypical chest pain and the second time for shortness of breath. She just had a follow-up echo and this shows severe residual LV dysfunction.  Overall the patient is doing well. She's participitation in cardiac rehab. She complains of cough, nonproductive. No shortness of breath, chest pain, lightheadedness. Denies orthopnea, PND, or leg swelling.   She has been compliant with her Life Vest and her medical program.  Outpatient Encounter Prescriptions as of 10/20/2012  Medication Sig Dispense Refill  . albuterol (PROVENTIL HFA;VENTOLIN HFA) 108 (90 BASE) MCG/ACT inhaler Inhale 2 puffs into the lungs every 4 (four) hours as needed for wheezing or shortness of breath.      Marland Kitchen albuterol (PROVENTIL) (2.5 MG/3ML) 0.083% nebulizer solution Take 2.5 mg by nebulization every 4 (four) hours as needed for wheezing or shortness of breath.      Marland Kitchen aspirin 81 MG chewable tablet Chew 81 mg by mouth daily.      . carvedilol (COREG) 12.5 MG tablet Take 12.5 mg by mouth 2 (two) times daily.      . chlorpheniramine-HYDROcodone (TUSSIONEX PENNKINETIC ER) 10-8 MG/5ML LQCR Take 5 mLs by mouth every 12 (twelve) hours as needed.  115 mL  0  . fexofenadine (ALLEGRA) 180 MG tablet Take 180 mg by mouth daily as needed (for allergies).      . fluticasone (FLONASE) 50 MCG/ACT nasal spray Place 2 sprays into the nose daily as needed for allergies.       . furosemide (LASIX) 40 MG tablet Take 1 tablet (40 mg total) by mouth daily.  30 tablet  3  . losartan (COZAAR) 25 MG tablet Take 1 tablet (25 mg total) by mouth daily.  30 tablet  11  . nitroGLYCERIN (NITROSTAT) 0.4 MG SL  tablet Place 0.4 mg under the tongue every 5 (five) minutes as needed for chest pain.      Marland Kitchen oxyCODONE (OXY IR/ROXICODONE) 5 MG immediate release tablet Take 5 mg by mouth every 6 (six) hours as needed for pain.      . pantoprazole (PROTONIX) 40 MG tablet Take 40 mg by mouth 2 (two) times daily.       . prasugrel (EFFIENT) 10 MG TABS Take 10 mg by mouth daily.      . simvastatin (ZOCOR) 80 MG tablet Take 80 mg by mouth at bedtime.      . [DISCONTINUED] chlorpheniramine-HYDROcodone (TUSSIONEX PENNKINETIC ER) 10-8 MG/5ML LQCR Take 5 mLs by mouth every 12 (twelve) hours as needed.  140 mL  0  . spironolactone (ALDACTONE) 25 MG tablet Take 0.5 tablets (12.5 mg total) by mouth daily.  30 tablet  6   No facility-administered encounter medications on file as of 10/20/2012.    Allergies  Allergen Reactions  . Dilaudid (Hydromorphone Hcl) Nausea And Vomiting  . Vicodin (Hydrocodone-Acetaminophen) Nausea And Vomiting  . Zofran Nausea And Vomiting    Past Medical History  Diagnosis Date  . Asthma   . GERD (gastroesophageal reflux disease)   . Hypertension   . 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 Barrett's esophagus with esophagitis 11/01/2011    Patient states no evidence of  barrett's from last surveillance EGD on 11/2010  . Stress bladder incontinence, female 11/01/2011  . 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 Complex regional pain syndrome of lower limb   . Coronary artery disease     STEMI 07/2012 Cath totally occluded prox LAD s/p DES, otherwise nonobstructive dz in RCA and LCx, EF 30%  . Systolic CHF     a. Echo 07/22/12 EF 30-35%, grade 1 diastolic dysfunction, akinesis of the mid-distalanterior and apical myocardium b. Echo 09/01/12: EF 25%, multiple WMAs suggesting LAD infarction, normal RV size and systolic function and a small circumferential pericardial effusion;  c. Echo 5/14:  EF 25%, mid and dist ant, septum apex and inf apex AK, mild MR, small effusion  .  Cardiac arrest     V.Fib arrest 07/2012 in the setting of acute anterior STEMI; Discharged with [facility]  . Hyperlipidemia   . Environmental allergies     Uses albuterol due to wheezing.  . Ischemic cardiomyopathy     ROS: Negative except as per HPI  BP 126/80  Pulse 81  Ht 5' 1.5" (1.562 m)  Wt 100.245 kg (221 lb)  BMI 41.09 kg/m2  SpO2 98%  PHYSICAL EXAM: Pt is alert and oriented, overweight woman in NAD HEENT: normal Neck: JVP - normal, carotids 2+= without bruits Lungs: CTA bilaterally CV: RRR without murmur or gallop Abd: soft, NT, Positive BS, no hepatomegaly Ext: no C/C/E, distal pulses intact and equal Skin: warm/dry no rash  Echo 10/18/2012: Study Conclusions  - Left ventricle: Basal function normal. Mid and distal anterior wall, septum apex and inferior apex akinetic The cavity size was severely dilated. Wall thickness was normal. The estimated ejection fraction was 25%. - Mitral valve: Mild regurgitation. - Pericardium, extracardiac: Small pericardial effusion Transthoracic echocardiography. M-mode, complete 2D, spectral Doppler, and color Doppler. Height: Height: 154.9cm. Height: 61in. Weight: Weight: 115.7kg. Weight: 254.5lb. Body mass index: BMI: 48.2kg/m^2. Body surface area: BSA: 2.49m^2. Blood pressure: 118/72. Patient status: Outpatient. Location: [place]  ------------------------------------------------------------  ------------------------------------------------------------ Left ventricle: Basal function normal. Mid and distal anterior wall, septum apex and inferior apex akinetic The cavity size was severely dilated. Wall thickness was normal. The estimated ejection fraction was 25%.  ------------------------------------------------------------ Aortic valve: Mildly thickened leaflets.  ------------------------------------------------------------ Mitral valve: Doppler: Mild regurgitation. Peak gradient: 3mm Hg  (D).  ------------------------------------------------------------ Left atrium: The atrium was normal in size.  ------------------------------------------------------------ Atrial septum: Poorly visualized.  ------------------------------------------------------------ Right ventricle: The cavity size was normal. Wall thickness was normal. Systolic function was normal.  ------------------------------------------------------------ Pulmonic valve: Doppler: Mild regurgitation.  ------------------------------------------------------------ Tricuspid valve: Doppler: Mild regurgitation.  ------------------------------------------------------------ Right atrium: The atrium was normal in size.  ------------------------------------------------------------ Pericardium: Small pericardial effusion   ASSESSMENT AND PLAN: 1. Chronic systolic CHF, NYHA Class 2 with severe residual LV dysfunction following AMI. Pt's LVEF has not improved, remains less than 30%. Recommend add aldactone 12.5 mg to her medical regimen. Will need to repeat BMET within 2 weeks. Refer to EP for consideration of ICD.  2. CAD s/p recent MI. No further angina.  3. HTN - continue cozaar and carvedilol  4. Hyperlipidemia - treated with simvastatin at high dose.  For follow-up I'll see her back in 3 months.  Tonny Bollman 10/24/2012 10:01 PM

## 2012-10-25 ENCOUNTER — Encounter (HOSPITAL_COMMUNITY)
Admission: RE | Admit: 2012-10-25 | Discharge: 2012-10-25 | Disposition: A | Discharge status: Home / Self Care | Payer: PRIVATE HEALTH INSURANCE | Source: Ambulatory Visit | Attending: Cardiovascular Disease | Admitting: Cardiovascular Disease

## 2012-10-25 NOTE — Progress Notes (Signed)
PSYCHOSOCIAL ASSESSMENT  Pt psychosocial assessment reveals no barriers to rehab participation.  Pt quality of life is slightly altered by her physical constraints which limits her ability to perform tasks as prior to her illness. Pt reports dissatisfaction with her residence in [place] and is anxiously looking forward to returning to the [facility] to live.  Pt will await her pending medical interventions before she makes a permanent move.   Pt was in excellent mood today as she was able to participate in hobby she enjoys, cake decorating.  However, pt also reports fatigue from overworking.  VS flow sheet forwarded to Dr. Excell Seltzer for review.   Pt exhibits positive coping skills and has supportive family.  Offered emotional reassurance and support.  Will continue to monitor.

## 2012-10-27 ENCOUNTER — Encounter: Payer: Self-pay | Admitting: Cardiovascular Disease

## 2012-10-27 ENCOUNTER — Encounter (HOSPITAL_COMMUNITY)
Admission: RE | Admit: 2012-10-27 | Discharge: 2012-10-27 | Disposition: A | Discharge status: Home / Self Care | Payer: PRIVATE HEALTH INSURANCE | Source: Ambulatory Visit | Attending: Cardiovascular Disease | Admitting: Cardiovascular Disease

## 2012-10-30 ENCOUNTER — Encounter (HOSPITAL_COMMUNITY): Payer: PRIVATE HEALTH INSURANCE

## 2012-11-01 ENCOUNTER — Encounter (HOSPITAL_COMMUNITY)
Admission: RE | Admit: 2012-11-01 | Discharge: 2012-11-01 | Disposition: A | Discharge status: Home / Self Care | Payer: PRIVATE HEALTH INSURANCE | Source: Ambulatory Visit | Attending: Cardiovascular Disease | Admitting: Cardiovascular Disease

## 2012-11-03 ENCOUNTER — Ambulatory Visit: Payer: PRIVATE HEALTH INSURANCE | Admitting: Cardiovascular Disease

## 2012-11-03 ENCOUNTER — Encounter (HOSPITAL_COMMUNITY): Payer: PRIVATE HEALTH INSURANCE

## 2012-11-03 DIAGNOSIS — I200 Unstable angina: Secondary | ICD-10-CM

## 2012-11-03 DIAGNOSIS — I10 Essential (primary) hypertension: Secondary | ICD-10-CM

## 2012-11-03 DIAGNOSIS — I5023 Acute on chronic systolic (congestive) heart failure: Secondary | ICD-10-CM

## 2012-11-03 DIAGNOSIS — I2510 Atherosclerotic heart disease of native coronary artery without angina pectoris: Secondary | ICD-10-CM

## 2012-11-03 DIAGNOSIS — I5022 Chronic systolic (congestive) heart failure: Secondary | ICD-10-CM

## 2012-11-03 LAB — BASIC METABOLIC PANEL
Calcium: 9.6 mg/dL (ref 8.4–10.5)
Glucose, Bld: 92 mg/dL (ref 70–99)
Sodium: 141 mEq/L (ref 135–145)

## 2012-11-06 ENCOUNTER — Encounter (HOSPITAL_COMMUNITY)
Admission: RE | Admit: 2012-11-06 | Discharge: 2012-11-06 | Disposition: A | Discharge status: Home / Self Care | Payer: PRIVATE HEALTH INSURANCE | Source: Ambulatory Visit | Attending: Cardiovascular Disease | Admitting: Cardiovascular Disease

## 2012-11-06 DIAGNOSIS — I259 Chronic ischemic heart disease, unspecified: Secondary | ICD-10-CM | POA: Insufficient documentation

## 2012-11-06 DIAGNOSIS — I2510 Atherosclerotic heart disease of native coronary artery without angina pectoris: Secondary | ICD-10-CM | POA: Insufficient documentation

## 2012-11-06 DIAGNOSIS — E785 Hyperlipidemia, unspecified: Secondary | ICD-10-CM | POA: Insufficient documentation

## 2012-11-06 DIAGNOSIS — Z5189 Encounter for other specified aftercare: Secondary | ICD-10-CM | POA: Insufficient documentation

## 2012-11-06 DIAGNOSIS — I252 Old myocardial infarction: Secondary | ICD-10-CM | POA: Insufficient documentation

## 2012-11-07 ENCOUNTER — Encounter: Payer: Self-pay | Admitting: Cardiovascular Disease

## 2012-11-08 ENCOUNTER — Encounter (HOSPITAL_COMMUNITY)
Admission: RE | Admit: 2012-11-08 | Discharge: 2012-11-08 | Disposition: A | Discharge status: Home / Self Care | Payer: PRIVATE HEALTH INSURANCE | Source: Ambulatory Visit | Attending: Cardiovascular Disease | Admitting: Cardiovascular Disease

## 2012-11-09 ENCOUNTER — Ambulatory Visit (INDEPENDENT_AMBULATORY_CARE_PROVIDER_SITE_OTHER): Payer: PRIVATE HEALTH INSURANCE | Admitting: Internal Medicine

## 2012-11-09 VITALS — BP 104/72 | HR 73 | Ht 61.5 in | Wt 222.0 lb

## 2012-11-09 DIAGNOSIS — I5022 Chronic systolic (congestive) heart failure: Secondary | ICD-10-CM | POA: Insufficient documentation

## 2012-11-09 DIAGNOSIS — I2589 Other forms of chronic ischemic heart disease: Secondary | ICD-10-CM

## 2012-11-09 DIAGNOSIS — I255 Ischemic cardiomyopathy: Secondary | ICD-10-CM

## 2012-11-09 NOTE — Assessment & Plan Note (Signed)
The patient has persistent ischemic cardiomyopathy with left ventricular dysfunction. This is despite guidelines directed medical therapy. She remains at risk for sudden cardiac death.  She is wearing a lifetest.  We discussed endovascular versus subcutaneous ICD  We discussed the risks of endovascular versus subcutaneous infection, the issue of leads were traversing the tricuspid valve, the abbreviated battery of the subcutaneous system, the relative paucity of data regarding effectiveness of the latter. We also discussed for screening in about 90%-95% of patients would be eligible for implantation in about 95% Wendy Reid would have successful implantation, the others requiring explantation of the subcutaneous device insertion of endovascular device.we also discussed inappropriate device therapy with both systems  She would like to pursue subcutaneous ICD screening.

## 2012-11-09 NOTE — Progress Notes (Signed)
[facility]   ELECTROPHYSIOLOGY CONSULT NOTE  Patient ID: Wendy Reid, MRN: [id number], DOB/AGE: [birth year] 62 y.o. Admit date: (Not on file) Date of Consult: 11/09/2012  Primary Physician: Rosario Adie, MD Primary Cardiologist: [facility]  Chief Complaint:  ICD    HPI Wendy Reid is a 62 y.o. female seen for consideration of ICD implantation.  He has a history of ischemic heart disease with anterior wall MI February 2014 treated with PCI. Complicated by ventricular fibrillation. She was discharged home with a LifeVest. Reassessment of LV function may 2014 demonstrated persistent dysfunction with an EF of 25% significant wall motion abnormalities.  ECG 3/14 demonstrated sinus tachycardia     She has struggled with coming to terms with her illness and her role by ignoring symtpoms in making the problem worse  Moderate sob, but no edema  No palps, no syncope   Past Medical History  Diagnosis Date  . Asthma   . GERD (gastroesophageal reflux disease)   . Hypertension   . 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 Barrett's esophagus with esophagitis 11/01/2011    Patient states no evidence of barrett's from last surveillance EGD on 11/2010  . Stress bladder incontinence, female 11/01/2011  . 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 Complex regional pain syndrome of lower limb   . Coronary artery disease     STEMI 07/2012 Cath totally occluded prox LAD s/p DES, otherwise nonobstructive dz in RCA and LCx, EF 30%  . Systolic CHF     a. Echo 07/22/12 EF 30-35%, grade 1 diastolic dysfunction, akinesis of the mid-distalanterior and apical myocardium b. Echo 09/01/12: EF 25%, multiple WMAs suggesting LAD infarction, normal RV size and systolic function and a small circumferential pericardial effusion;  c. Echo 5/14:  EF 25%, mid and dist ant, septum apex and inf apex AK, mild MR, small effusion  . Cardiac arrest     V.Fib arrest 07/2012 in the setting of acute anterior STEMI;  Discharged with Lifevest  . Hyperlipidemia   . Environmental allergies     Uses albuterol due to wheezing.  . Ischemic cardiomyopathy       Surgical History:  Past Surgical History  Procedure Laterality Date  . Cholecystectomy  nov 2011  . Appendectomy  as teenager  . Tonsillectomy  as teenager  . Abdominal hysterectomy  1999    complete  . Both knees arthroscopic sugery last done 2007    . Total knee arthroplasty  10/29/2011    Procedure: TOTAL KNEE ARTHROPLASTY;  Surgeon: Eugenia Mcalpine, MD;  Location: [facility];  Service: Orthopedics;  Laterality: Right;  . Coronary angioplasty with stent placement Left 07/21/2012    LAD x 1      Home Meds: Prior to Admission medications   Medication Sig Start Date End Date Taking? Authorizing Provider  albuterol (PROVENTIL HFA;VENTOLIN HFA) 108 (90 BASE) MCG/ACT inhaler Inhale 2 puffs into the lungs every 4 (four) hours as needed for wheezing or shortness of breath.   Yes Historical Provider, MD  albuterol (PROVENTIL) (2.5 MG/3ML) 0.083% nebulizer solution Take 2.5 mg by nebulization every 4 (four) hours as needed for wheezing or shortness of breath.   Yes Historical Provider, MD  aspirin EC 81 MG tablet Take 81 mg by mouth daily.   Yes Historical Provider, MD  carvedilol (COREG) 12.5 MG tablet Take 12.5 mg by mouth 2 (two) times daily.   Yes Historical Provider, MD  chlorpheniramine-HYDROcodone Stevphen Meuse [facility])  10-8 MG/5ML LQCR Take 5 mLs by mouth every 12 (twelve) hours as needed. 10/20/12  Yes Tonny Bollman, MD  fexofenadine (ALLEGRA) 180 MG tablet Take 180 mg by mouth daily as needed (for allergies).   Yes Historical Provider, MD  fluticasone (FLONASE) 50 MCG/ACT nasal spray Place 2 sprays into the nose daily as needed for allergies.    Yes Historical Provider, MD  furosemide (LASIX) 40 MG tablet Take 1 tablet (40 mg total) by mouth daily. 09/01/12  Yes Roger A Arguello, PA-C  losartan (COZAAR) 25 MG tablet Take 1 tablet (25 mg total) by  mouth daily. 09/06/12  Yes Tonny Bollman, MD  nitroGLYCERIN (NITROSTAT) 0.4 MG SL tablet Place 0.4 mg under the tongue every 5 (five) minutes as needed for chest pain.   Yes Historical Provider, MD  oxyCODONE (OXY IR/ROXICODONE) 5 MG immediate release tablet Take 5 mg by mouth every 6 (six) hours as needed for pain.   Yes Historical Provider, MD  pantoprazole (PROTONIX) 40 MG tablet Take 40 mg by mouth 2 (two) times daily.    Yes Historical Provider, MD  prasugrel (EFFIENT) 10 MG TABS Take 10 mg by mouth daily.   Yes Historical Provider, MD  simvastatin (ZOCOR) 80 MG tablet Take 80 mg by mouth at bedtime.   Yes Historical Provider, MD  spironolactone (ALDACTONE) 25 MG tablet Take 0.5 tablets (12.5 mg total) by mouth daily. 10/20/12  Yes Tonny Bollman, MD     Allergies:  Allergies  Allergen Reactions  . Dilaudid (Hydromorphone Hcl) Nausea And Vomiting  . Vicodin (Hydrocodone-Acetaminophen) Nausea And Vomiting  . Zofran Nausea And Vomiting    History   Social History  . Marital Status: Widowed    Spouse Name: N/A    Number of Children: N/A  . Years of Education: N/A   Occupational History  . Not on file.   Social History Main Topics  . Smoking status: Never Smoker   . Smokeless tobacco: Never Used  . Alcohol Use: No  . Drug Use: No  . Sexually Active: Not on file   Other Topics Concern  . Not on file   Social History Narrative  . No narrative on file     Family History  Problem Relation Age of Onset  . Heart disease Mother     MI at age 53s     ROS:  Please see the history of present illness.   Negative except arthis hiatal hernia allergies   All other systems reviewed and negative.    Physical Exam:   Blood pressure 104/72, pulse 73, height 5' 1.5" (1.562 m), weight 222 lb (100.699 kg). General: Well developed, well nourished female in no acute distress. Head: Normocephalic, atraumatic, sclera non-icteric, no xanthomas, nares are without discharge. EENT:  normal Lymph Nodes:  none Back: without scoliosis/kyphosis , no CVA tendersness Neck: Negative for carotid bruits. JVD 6-7 Lungs: Clear bilaterally to auscultation without wheezes, rales, or rhonchi. Breathing is unlabored. Heart: RRR with S1 S2. No  murmur , rubs, or gallops appreciated. Abdomen: Soft, non-tender, non-distended with normoactive bowel sounds. No hepatomegaly. No rebound/guarding. No obvious abdominal masses. Msk:  Strength and tone appear normal for age. Extremities: No clubbing or cyanosis. No  edema.  Distal pedal pulses are 2+ and equal bilaterally. Skin: Warm and Dry Neuro: Alert and oriented X 3. CN III-XII intact Grossly normal sensory and motor function . Psych:  Responds to questions appropriately with a normal affect.      Labs: Cardiac Enzymes  No results found for this basename: CKTOTAL, CKMB, TROPONINI,  in the last 72 hours CBC Lab Results  Component Value Date   WBC 4.8 09/01/2012   HGB 10.5* 09/01/2012   HCT 31.3* 09/01/2012   MCV 84.4 09/01/2012   PLT 261 09/01/2012   PROTIME: No results found for this basename: LABPROT, INR,  in the last 72 hours Chemistry  Recent Labs Lab 11/03/12 1654  NA 141  K 3.9  CL 106  CO2 25  BUN 14  CREATININE 0.82  CALCIUM 9.6  GLUCOSE 92   Lipids Lab Results  Component Value Date   CHOL 124 08/16/2012   HDL 48.00 08/16/2012   LDLCALC 55 08/16/2012   TRIG 105.0 08/16/2012   BNP Pro B Natriuretic peptide (BNP)  Date/Time Value Range Status  08/31/2012  4:33 PM 2623.0* 0 - 125 pg/mL Final  08/23/2012  9:49 AM 431.0* 0.0 - 100.0 pg/mL Final  07/21/2012  3:14 AM <5.0  0 - 125 pg/mL Final   Miscellaneous Lab Results  Component Value Date   DDIMER 7.16* 11/03/2011    Radiology/Studies:  No results found.  EKG:   NSR 73  15/09/34 LAD  Assessment and Plan: *   Sherryl Manges

## 2012-11-10 ENCOUNTER — Encounter (HOSPITAL_COMMUNITY)
Admission: RE | Admit: 2012-11-10 | Discharge: 2012-11-10 | Disposition: A | Discharge status: Home / Self Care | Payer: PRIVATE HEALTH INSURANCE | Source: Ambulatory Visit | Attending: Cardiovascular Disease | Admitting: Cardiovascular Disease

## 2012-11-10 ENCOUNTER — Encounter: Payer: Self-pay | Admitting: Cardiovascular Disease

## 2012-11-10 NOTE — Telephone Encounter (Signed)
This encounter was created in error - please disregard.

## 2012-11-10 NOTE — Telephone Encounter (Signed)
New Problem:    Patient called in returning your call regarding her recent lab results.  Please call back, will go to cardiac rehab at 12:15pm.

## 2012-11-13 ENCOUNTER — Encounter (HOSPITAL_COMMUNITY)
Admission: RE | Admit: 2012-11-13 | Discharge: 2012-11-13 | Disposition: A | Discharge status: Home / Self Care | Payer: PRIVATE HEALTH INSURANCE | Source: Ambulatory Visit | Attending: Cardiovascular Disease | Admitting: Cardiovascular Disease

## 2012-11-14 ENCOUNTER — Telehealth: Payer: Self-pay | Admitting: Internal Medicine

## 2012-11-14 NOTE — Telephone Encounter (Signed)
Per Joey with [employer] ok to see Thursday this week between 11:30 am and 2:00 pm. Sherri Rad, RN, BSN  I discussed with the patient. She will be here on 6/12 at 11:30 am. Joey aware. Sherri Rad, RN, BSN

## 2012-11-14 NOTE — Telephone Encounter (Signed)
I spoke with the patient and made her aware I have to talk with the device rep to coordinate mapping for her AICD. I have sent Joey a message with [employer] to try to schedule. The patient is in cardiac rehab on M, W, F at 1:15 pm. She is moving to [place] at the end of July.

## 2012-11-14 NOTE — Telephone Encounter (Signed)
Returned call to patient no answer.LMTC. 

## 2012-11-14 NOTE — Telephone Encounter (Signed)
New Problem  Pt wants to speak with you regarding the appt for the mapping for defib.

## 2012-11-15 ENCOUNTER — Encounter (HOSPITAL_COMMUNITY)
Admission: RE | Admit: 2012-11-15 | Discharge: 2012-11-15 | Disposition: A | Discharge status: Home / Self Care | Payer: PRIVATE HEALTH INSURANCE | Source: Ambulatory Visit | Attending: Cardiovascular Disease | Admitting: Cardiovascular Disease

## 2012-11-16 ENCOUNTER — Encounter (HOSPITAL_COMMUNITY): Payer: Self-pay | Admitting: Pharmacy Technician

## 2012-11-16 ENCOUNTER — Ambulatory Visit (INDEPENDENT_AMBULATORY_CARE_PROVIDER_SITE_OTHER): Payer: PRIVATE HEALTH INSURANCE | Admitting: Internal Medicine

## 2012-11-16 ENCOUNTER — Encounter: Payer: Self-pay | Admitting: *Deleted

## 2012-11-16 ENCOUNTER — Other Ambulatory Visit: Payer: Self-pay | Admitting: *Deleted

## 2012-11-16 DIAGNOSIS — I2589 Other forms of chronic ischemic heart disease: Secondary | ICD-10-CM

## 2012-11-16 DIAGNOSIS — I509 Heart failure, unspecified: Secondary | ICD-10-CM

## 2012-11-16 DIAGNOSIS — I255 Ischemic cardiomyopathy: Secondary | ICD-10-CM

## 2012-11-16 LAB — BASIC METABOLIC PANEL
BUN: 17 mg/dL (ref 6–23)
Chloride: 107 mEq/L (ref 96–112)
Potassium: 3.4 mEq/L — ABNORMAL LOW (ref 3.5–5.1)
Sodium: 143 mEq/L (ref 135–145)

## 2012-11-16 LAB — CBC WITH DIFFERENTIAL/PLATELET
Eosinophils Relative: 2.3 % (ref 0.0–5.0)
HCT: 30.2 % — ABNORMAL LOW (ref 36.0–46.0)
Hemoglobin: 10.1 g/dL — ABNORMAL LOW (ref 12.0–15.0)
Lymphs Abs: 1.6 10*3/uL (ref 0.7–4.0)
Monocytes Relative: 5.7 % (ref 3.0–12.0)
Platelets: 245 10*3/uL (ref 150.0–400.0)
WBC: 5.4 10*3/uL (ref 4.5–10.5)

## 2012-11-16 LAB — PROTIME-INR
INR: 1.1 ratio — ABNORMAL HIGH (ref 0.8–1.0)
Prothrombin Time: 11.4 s (ref 10.2–12.4)

## 2012-11-16 NOTE — Patient Instructions (Signed)

## 2012-11-17 ENCOUNTER — Encounter (HOSPITAL_COMMUNITY)
Admission: RE | Admit: 2012-11-17 | Discharge: 2012-11-17 | Disposition: A | Discharge status: Home / Self Care | Payer: PRIVATE HEALTH INSURANCE | Source: Ambulatory Visit | Attending: Cardiovascular Disease | Admitting: Cardiovascular Disease

## 2012-11-20 ENCOUNTER — Encounter (HOSPITAL_COMMUNITY): Payer: PRIVATE HEALTH INSURANCE

## 2012-11-22 ENCOUNTER — Telehealth: Payer: Self-pay | Admitting: Cardiovascular Disease

## 2012-11-22 ENCOUNTER — Ambulatory Visit (INDEPENDENT_AMBULATORY_CARE_PROVIDER_SITE_OTHER): Payer: PRIVATE HEALTH INSURANCE | Admitting: Cardiology

## 2012-11-22 ENCOUNTER — Encounter (HOSPITAL_COMMUNITY): Payer: PRIVATE HEALTH INSURANCE

## 2012-11-22 ENCOUNTER — Encounter: Payer: Self-pay | Admitting: Cardiology

## 2012-11-22 VITALS — BP 102/70 | HR 72 | Ht 61.5 in | Wt 220.0 lb

## 2012-11-22 DIAGNOSIS — I259 Chronic ischemic heart disease, unspecified: Secondary | ICD-10-CM

## 2012-11-22 NOTE — Telephone Encounter (Signed)
New Prob    Pt states she has been having some pain in her arm and would like to speak to nurse regarding this. Please call.

## 2012-11-22 NOTE — Telephone Encounter (Signed)
Pt calls today b/c she has been experiencing left arm pain & had some relief with NTG last pm "but it never really went away" She states she has had this pain again & no other symptoms.   "I want to have this checked out before I have my procedure tomorrow" Pt is having an ICD implanted tomorrow. She is concerned about this & did not want to have to go to the ED unless she could not be seen by our office.  She has experienced tightness in the neck but states that this is nothing new.  Scheduled DOD appointment for today  Mylo Red RN

## 2012-11-22 NOTE — Patient Instructions (Addendum)
KEEP APP FOR ICD PLACEMENT TOMORROW

## 2012-11-22 NOTE — Progress Notes (Signed)
Wendy Reid Date of Birth:  03/30/1951 [name] [address] [place], [place]  [address] [phone]  Fax   [phone]  HPI: This pleasant 62 year old woman is seen as a DOD work in office visit .  She wanted to be checked because she was feeling anxious.  She is scheduled for an ICD implantation tomorrow.  She had experienced some mild left arm discomfort with partial relief with nitroglycerin last night.  She did not have any increased dyspnea or any diaphoresis or nausea and vomiting.  She considered canceling the appointment because she was feeling better but decided to come and be checked.  She has known ischemic heart disease and had an acute anterior wall myocardial infarction complicated by ventricular fibrillation and cardiac arrest in February 2014 and was treated with primary PCI.  She was discharged home with a life test.  She has been rehospitalized twice, once for atypical chest pain and the second time for shortness of breath.  She had a recent echocardiogram which shows that she still has significant left ventricular dysfunction with estimated ejection fraction of 25% on echo done on 10/18/12.  The mid and distal anterior wall, septum, apex, and inferior apex are akinetic.  The cavity size was severely dilated and wall thickness was normal.  Current Outpatient Prescriptions  Medication Sig Dispense Refill  . albuterol (PROVENTIL HFA;VENTOLIN HFA) 108 (90 BASE) MCG/ACT inhaler Inhale 2 puffs into the lungs every 4 (four) hours as needed for wheezing or shortness of breath.      Marland Kitchen albuterol (PROVENTIL) (2.5 MG/3ML) 0.083% nebulizer solution Take 2.5 mg by nebulization every 4 (four) hours as needed for wheezing or shortness of breath.      Marland Kitchen aspirin EC 81 MG tablet Take 81 mg by mouth daily.      Marland Kitchen atorvastatin (LIPITOR) 80 MG tablet Take 80 mg by mouth daily.      . carvedilol (COREG) 12.5 MG tablet Take 12.5 mg by mouth 2 (two) times daily.        . chlorpheniramine-HYDROcodone (TUSSIONEX) 10-8 MG/5ML LQCR Take 5 mLs by mouth every 12 (twelve) hours as needed. For cough      . fexofenadine (ALLEGRA) 180 MG tablet Take 180 mg by mouth daily as needed (for allergies). For allergies      . fluticasone (FLONASE) 50 MCG/ACT nasal spray Place 2 sprays into the nose daily as needed for allergies. For allergies      . furosemide (LASIX) 40 MG tablet Take 1 tablet (40 mg total) by mouth daily.  30 tablet  3  . losartan (COZAAR) 25 MG tablet Take 1 tablet (25 mg total) by mouth daily.  30 tablet  11  . nitroGLYCERIN (NITROSTAT) 0.4 MG SL tablet Place 0.4 mg under the tongue every 5 (five) minutes as needed for chest pain. x3 doses as needed for chest pain      . oxyCODONE (OXY IR/ROXICODONE) 5 MG immediate release tablet Take 5 mg by mouth every 6 (six) hours as needed for pain. For pain      . pantoprazole (PROTONIX) 40 MG tablet Take 40 mg by mouth 2 (two) times daily.       . prasugrel (EFFIENT) 10 MG TABS Take 10 mg by mouth daily.      Marland Kitchen spironolactone (ALDACTONE) 25 MG tablet Take 0.5 tablets (12.5 mg total) by mouth daily.  30 tablet  6   No current facility-administered medications for this visit.    Allergies  Allergen Reactions  . Dilaudid (Hydromorphone Hcl) Nausea And Vomiting  . Vicodin (Hydrocodone-Acetaminophen) Nausea And Vomiting  . Zofran Nausea And Vomiting    Patient Active Problem List   Diagnosis Date Noted  . Ischemic cardiomyopathy 11/09/2012  . Chronic systolic heart failure 11/09/2012  . Normocytic anemia 09/01/2012  . Cough 09/01/2012  . Fracture of sternum 09/01/2012  . Obesity (BMI 30-39.9) 07/31/2012  . Coronary artery disease   . History of acute anterior wall myocardial infarction   . Sleep apnea   . Ulnar nerve compression 07/23/2012  . HTN (hypertension) 11/01/2011  . Osteoarthritis of knee 11/01/2011  . GERD (gastroesophageal reflux disease) 11/01/2011  . Asthma in adult 11/01/2011  . Barrett's  esophagus with esophagitis 11/01/2011  . Stress bladder incontinence, female 11/01/2011  . Hemolytic anemia 11/01/2011    History  Smoking status  . Never Smoker   Smokeless tobacco  . Never Used    History  Alcohol Use No    Family History  Problem Relation Age of Onset  . Heart disease Mother     MI at age 60s    Review of Systems: The patient denies any heat or cold intolerance.  No weight gain or weight loss.  The patient denies headaches or blurry vision.  There is no cough or sputum production.  The patient denies dizziness.  There is no hematuria or hematochezia.  The patient denies any muscle aches or arthritis.  The patient denies any rash.  The patient denies frequent falling or instability.  There is no history of depression or anxiety.  All other systems were reviewed and are negative.   Physical Exam: Filed Vitals:   11/22/12 1524  BP: 102/70  Pulse: 72   the general appearance reveals a pleasant middle-aged African American woman in no distress.  Her left arm discomfort has essentially disappeared now.  She is wearing a life vest. Pupils equal and reactive.   Extraocular Movements are full.  There is no scleral icterus.  The mouth and pharynx are normal.  The neck is supple.  The carotids reveal no bruits.  The jugular venous pressure is normal.  The thyroid is not enlarged.  There is no lymphadenopathy.  The chest is clear to percussion and auscultation. There are no rales or rhonchi. Expansion of the chest is symmetrical.  The precordium is quiet.  The first heart sound is normal.  The second heart sound is physiologically split.  There is no murmur gallop rub or click.  There is no abnormal lift or heave.  The abdomen is soft and nontender. Bowel sounds are normal. The liver and spleen are not enlarged. There Are no abdominal masses. There are no bruits.  The pedal pulses are good.  There is no phlebitis or edema.  There is no cyanosis or clubbing. Strength is  normal and symmetrical in all extremities.  There is no lateralizing weakness.  There are no sensory deficits.  EKG shows normal sinus rhythm with left anterior fascicular block and changes of an old extensive anterolateral myocardial infarction.  The tracing is unchanged from prior tracings    Assessment / Plan: The patient was reassured.  She will continue her medication as prescribed.  She knows not to take her Lasix or her spironolactone tomorrow morning prior to ICD placement.  She expressed gratitude at being checked in the office today.

## 2012-11-23 ENCOUNTER — Encounter (HOSPITAL_COMMUNITY): Payer: Self-pay | Admitting: Anesthesiology

## 2012-11-23 ENCOUNTER — Encounter (HOSPITAL_COMMUNITY)
Admission: RE | Disposition: A | Discharge status: Home / Self Care | Payer: Self-pay | Source: Ambulatory Visit | Attending: Internal Medicine

## 2012-11-23 ENCOUNTER — Ambulatory Visit (HOSPITAL_COMMUNITY)
Admission: RE | Admit: 2012-11-23 | Discharge: 2012-11-23 | Disposition: A | Discharge status: Home / Self Care | Payer: PRIVATE HEALTH INSURANCE | Source: Ambulatory Visit | Attending: Internal Medicine | Admitting: Internal Medicine

## 2012-11-23 DIAGNOSIS — Z538 Procedure and treatment not carried out for other reasons: Secondary | ICD-10-CM | POA: Insufficient documentation

## 2012-11-23 DIAGNOSIS — I2589 Other forms of chronic ischemic heart disease: Secondary | ICD-10-CM | POA: Insufficient documentation

## 2012-11-23 SURGERY — IMPLANTABLE CARDIOVERTER DEFIBRILLATOR IMPLANT
Anesthesia: Monitor Anesthesia Care

## 2012-11-23 NOTE — Progress Notes (Signed)
Procedure cancelled per Dr. Graciela Husbands.  Pt informed that office will call to rescheduled. Pt d/c'd.

## 2012-11-23 NOTE — Progress Notes (Signed)
Pt presented for procedure today with new onset nausea  We have elected to postpone the procedure so taht she can get that evaluated

## 2012-11-23 NOTE — Anesthesia Preprocedure Evaluation (Addendum)
Anesthesia Evaluation  Patient identified by MRN, date of birth, ID band Patient awake    Reviewed: Allergy & Precautions, H&P , NPO status , Patient's Chart, lab work & pertinent test results  History of Anesthesia Complications Negative for: history of anesthetic complications  Airway Mallampati: II TM Distance: >3 FB Neck ROM: Full    Dental  (+) Teeth Intact and Dental Advisory Given   Pulmonary asthma (albuterol MDI) , sleep apnea (STOPBANG 4, no CPAP) ,  breath sounds clear to auscultation  Pulmonary exam normal       Cardiovascular hypertension, Pt. on medications and Pt. on home beta blockers + CAD (cath 2/14: EF 30%, occluded LAD stent), + Past MI and + Cardiac Stents + dysrhythmias (Vfib arrest with STEMI 2/14) Ventricular Fibrillation Rhythm:Regular Rate:Normal  5/14 ECHO: EF 25%, akinesis of septum, anterior, inferior segments   Neuro/Psych negative neurological ROS  negative psych ROS   GI/Hepatic Neg liver ROS, GERD-  Medicated and Controlled,  Endo/Other  negative endocrine ROSMorbid obesity  Renal/GU negative Renal ROS  negative genitourinary   Musculoskeletal negative musculoskeletal ROS (+)   Abdominal (+) + obese,   Peds negative pediatric ROS (+)  Hematology negative hematology ROS (+) Blood dyscrasia (Hb 10.6, INR 1,2), anemia ,   Anesthesia Other Findings   Reproductive/Obstetrics negative OB ROS                         Anesthesia Physical Anesthesia Plan  ASA: III  Anesthesia Plan: MAC   Post-op Pain Management:    Induction: Intravenous  Airway Management Planned: Simple Face Mask and Natural Airway  Additional Equipment:   Intra-op Plan:   Post-operative Plan:   Informed Consent: I have reviewed the patients History and Physical, chart, labs and discussed the procedure including the risks, benefits and alternatives for the proposed anesthesia with the  patient or authorized representative who has indicated his/her understanding and acceptance.   Dental advisory given  Plan Discussed with: CRNA and Surgeon  Anesthesia Plan Comments: (Plan routine monitors, MAC)       Anesthesia Quick Evaluation

## 2012-11-24 ENCOUNTER — Emergency Department (HOSPITAL_COMMUNITY): Payer: PRIVATE HEALTH INSURANCE

## 2012-11-24 ENCOUNTER — Emergency Department (HOSPITAL_COMMUNITY)
Admission: EM | Admit: 2012-11-24 | Discharge: 2012-11-25 | Disposition: A | Discharge status: Home / Self Care | Payer: PRIVATE HEALTH INSURANCE | Attending: Emergency Medicine | Admitting: Emergency Medicine

## 2012-11-24 ENCOUNTER — Encounter (HOSPITAL_COMMUNITY): Payer: Self-pay | Admitting: *Deleted

## 2012-11-24 ENCOUNTER — Encounter (HOSPITAL_COMMUNITY): Payer: PRIVATE HEALTH INSURANCE

## 2012-11-24 DIAGNOSIS — Z9861 Coronary angioplasty status: Secondary | ICD-10-CM | POA: Insufficient documentation

## 2012-11-24 DIAGNOSIS — M1710 Unilateral primary osteoarthritis, unspecified knee: Secondary | ICD-10-CM | POA: Insufficient documentation

## 2012-11-24 DIAGNOSIS — M79609 Pain in unspecified limb: Secondary | ICD-10-CM | POA: Insufficient documentation

## 2012-11-24 DIAGNOSIS — J45909 Unspecified asthma, uncomplicated: Secondary | ICD-10-CM | POA: Insufficient documentation

## 2012-11-24 DIAGNOSIS — K219 Gastro-esophageal reflux disease without esophagitis: Secondary | ICD-10-CM | POA: Insufficient documentation

## 2012-11-24 DIAGNOSIS — IMO0002 Reserved for concepts with insufficient information to code with codable children: Secondary | ICD-10-CM | POA: Insufficient documentation

## 2012-11-24 DIAGNOSIS — E785 Hyperlipidemia, unspecified: Secondary | ICD-10-CM | POA: Insufficient documentation

## 2012-11-24 DIAGNOSIS — Z79899 Other long term (current) drug therapy: Secondary | ICD-10-CM | POA: Insufficient documentation

## 2012-11-24 DIAGNOSIS — I2510 Atherosclerotic heart disease of native coronary artery without angina pectoris: Secondary | ICD-10-CM | POA: Insufficient documentation

## 2012-11-24 DIAGNOSIS — Z8679 Personal history of other diseases of the circulatory system: Secondary | ICD-10-CM | POA: Insufficient documentation

## 2012-11-24 DIAGNOSIS — M79602 Pain in left arm: Secondary | ICD-10-CM

## 2012-11-24 DIAGNOSIS — I5020 Unspecified systolic (congestive) heart failure: Secondary | ICD-10-CM | POA: Insufficient documentation

## 2012-11-24 DIAGNOSIS — R072 Precordial pain: Secondary | ICD-10-CM | POA: Insufficient documentation

## 2012-11-24 DIAGNOSIS — I252 Old myocardial infarction: Secondary | ICD-10-CM | POA: Insufficient documentation

## 2012-11-24 DIAGNOSIS — I10 Essential (primary) hypertension: Secondary | ICD-10-CM | POA: Insufficient documentation

## 2012-11-24 DIAGNOSIS — R110 Nausea: Secondary | ICD-10-CM | POA: Insufficient documentation

## 2012-11-24 DIAGNOSIS — K2270 Barrett's esophagus without dysplasia: Secondary | ICD-10-CM | POA: Insufficient documentation

## 2012-11-24 DIAGNOSIS — K209 Esophagitis, unspecified without bleeding: Secondary | ICD-10-CM | POA: Insufficient documentation

## 2012-11-24 DIAGNOSIS — Z7982 Long term (current) use of aspirin: Secondary | ICD-10-CM | POA: Insufficient documentation

## 2012-11-24 DIAGNOSIS — Z862 Personal history of diseases of the blood and blood-forming organs and certain disorders involving the immune mechanism: Secondary | ICD-10-CM | POA: Insufficient documentation

## 2012-11-24 LAB — CBC
Hemoglobin: 10 g/dL — ABNORMAL LOW (ref 12.0–15.0)
MCH: 28.8 pg (ref 26.0–34.0)
RBC: 3.47 MIL/uL — ABNORMAL LOW (ref 3.87–5.11)

## 2012-11-24 LAB — BASIC METABOLIC PANEL
CO2: 30 mEq/L (ref 19–32)
Glucose, Bld: 125 mg/dL — ABNORMAL HIGH (ref 70–99)
Potassium: 3.2 mEq/L — ABNORMAL LOW (ref 3.5–5.1)
Sodium: 140 mEq/L (ref 135–145)

## 2012-11-24 LAB — POCT I-STAT TROPONIN I

## 2012-11-24 MED ORDER — GI COCKTAIL ~~LOC~~
30.0000 mL | Freq: Once | ORAL | Status: AC
  Administered 2012-11-24: 30 mL via ORAL
  Filled 2012-11-24: qty 30

## 2012-11-24 MED ORDER — FAMOTIDINE 20 MG PO TABS
40.0000 mg | ORAL_TABLET | Freq: Once | ORAL | Status: AC
  Administered 2012-11-24: 40 mg via ORAL
  Filled 2012-11-24: qty 2

## 2012-11-24 NOTE — ED Notes (Signed)
The pt is c/o some lt arm pain today.  She has minimal chest discomfort. Past mi.  She has a defib attached to her

## 2012-11-24 NOTE — ED Notes (Signed)
She has taken a sl nitro maalox and aspirin 81 mgs in the past hour

## 2012-11-24 NOTE — ED Provider Notes (Signed)
History     CSN: [id number]  Arrival date & time 11/24/12  2050   First MD Initiated Contact with Patient 11/24/12 2100      Chief Complaint  Patient presents with  . Arm Pain    (Consider location/radiation/quality/duration/timing/severity/associated sxs/prior treatment) Patient is a 62 y.o. female presenting with extremity pain. The history is provided by the patient.  Extremity Pain This is a new problem. The current episode started in the past 7 days (two days ago). The problem has been unchanged. Associated symptoms include chest pain (pt endorses a substernal discomfort intermittently that has been present multiple times in the past. ). Pertinent negatives include no abdominal pain, chills, congestion, coughing, diaphoresis, fatigue, fever, nausea, numbness, vomiting or weakness. Nothing aggravates the symptoms. Treatments tried: home oxycodone. The treatment provided moderate relief.    Past Medical History  Diagnosis Date  . Asthma   . GERD (gastroesophageal reflux disease)   . Hypertension   . 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 Barrett's esophagus with esophagitis 11/01/2011    Patient states no evidence of barrett's from last surveillance EGD on 11/2010  . Stress bladder incontinence, female 11/01/2011  . 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 Complex regional pain syndrome of lower limb   . Coronary artery disease     STEMI 07/2012 Cath totally occluded prox LAD s/p DES, otherwise nonobstructive dz in RCA and LCx, EF 30%  . Systolic CHF     a. Echo 07/22/12 EF 30-35%, grade 1 diastolic dysfunction, akinesis of the mid-distalanterior and apical myocardium b. Echo 09/01/12: EF 25%, multiple WMAs suggesting LAD infarction, normal RV size and systolic function and a small circumferential pericardial effusion;  c. Echo 5/14:  EF 25%, mid and dist ant, septum apex and inf apex AK, mild MR, small effusion  . Cardiac arrest     V.Fib arrest 07/2012 in the  setting of acute anterior STEMI; Discharged with [employer]  . Hyperlipidemia   . Environmental allergies     Uses albuterol due to wheezing.  . Ischemic cardiomyopathy     Past Surgical History  Procedure Laterality Date  . Cholecystectomy  nov 2011  . Appendectomy  as teenager  . Tonsillectomy  as teenager  . Abdominal hysterectomy  1999    complete  . Both knees arthroscopic sugery last done 2007    . Total knee arthroplasty  10/29/2011    Procedure: TOTAL KNEE ARTHROPLASTY;  Surgeon: Eugenia Mcalpine, MD;  Location: [facility];  Service: Orthopedics;  Laterality: Right;  . Coronary angioplasty with stent placement Left 07/21/2012    LAD x 1     Family History  Problem Relation Age of Onset  . Heart disease Mother     MI at age 69s    History  Substance Use Topics  . Smoking status: Never Smoker   . Smokeless tobacco: Never Used  . Alcohol Use: No    OB History   Grav Para Term Preterm Abortions TAB SAB Ect Mult Living                  Review of Systems  Constitutional: Negative for fever, chills, diaphoresis and fatigue.  HENT: Negative for congestion and rhinorrhea.   Respiratory: Negative for cough, chest tightness and shortness of breath.   Cardiovascular: Positive for chest pain (pt endorses a substernal discomfort intermittently that has been present multiple times in the past. ). Negative for palpitations.  Gastrointestinal:  Negative for nausea, vomiting, abdominal pain, diarrhea and constipation.  Genitourinary: Negative for dysuria.  Neurological: Negative for dizziness, weakness and numbness.  All other systems reviewed and are negative.    Allergies  Dilaudid; Vicodin; and Zofran  Home Medications   Current Outpatient Rx  Name  Route  Sig  Dispense  Refill  . albuterol (PROVENTIL HFA;VENTOLIN HFA) 108 (90 BASE) MCG/ACT inhaler   Inhalation   Inhale 2 puffs into the lungs every 4 (four) hours as needed for wheezing or shortness of breath.         Marland Kitchen  albuterol (PROVENTIL) (2.5 MG/3ML) 0.083% nebulizer solution   Nebulization   Take 2.5 mg by nebulization every 4 (four) hours as needed for wheezing or shortness of breath.         Marland Kitchen aspirin EC 81 MG tablet   Oral   Take 81 mg by mouth daily.         Marland Kitchen atorvastatin (LIPITOR) 80 MG tablet   Oral   Take 80 mg by mouth every evening.          . carvedilol (COREG) 12.5 MG tablet   Oral   Take 12.5 mg by mouth 2 (two) times daily.         . chlorpheniramine-HYDROcodone (TUSSIONEX) 10-8 MG/5ML LQCR   Oral   Take 5 mLs by mouth every 12 (twelve) hours as needed. For cough         . fexofenadine (ALLEGRA) 180 MG tablet   Oral   Take 180 mg by mouth daily as needed (for allergies). For allergies         . fluticasone (FLONASE) 50 MCG/ACT nasal spray   Nasal   Place 2 sprays into the nose daily as needed for allergies. For allergies         . furosemide (LASIX) 40 MG tablet   Oral   Take 1 tablet (40 mg total) by mouth daily.   30 tablet   3   . losartan (COZAAR) 25 MG tablet   Oral   Take 1 tablet (25 mg total) by mouth daily.   30 tablet   11   . nitroGLYCERIN (NITROSTAT) 0.4 MG SL tablet   Sublingual   Place 0.4 mg under the tongue every 5 (five) minutes as needed for chest pain. x3 doses as needed for chest pain         . oxyCODONE (OXY IR/ROXICODONE) 5 MG immediate release tablet   Oral   Take 5 mg by mouth every 6 (six) hours as needed for pain. For pain         . pantoprazole (PROTONIX) 40 MG tablet   Oral   Take 40 mg by mouth 2 (two) times daily.          . prasugrel (EFFIENT) 10 MG TABS   Oral   Take 10 mg by mouth daily.         Marland Kitchen spironolactone (ALDACTONE) 25 MG tablet   Oral   Take 0.5 tablets (12.5 mg total) by mouth daily.   30 tablet   6     BP 138/124  Pulse 79  Temp(Src) 98.4 F (36.9 C) (Oral)  Resp 20  SpO2 98%  Physical Exam  Nursing note and vitals reviewed. Constitutional: She is oriented to person, place,  and time. She appears well-developed and well-nourished. No distress.  HENT:  Head: Normocephalic and atraumatic.  Mouth/Throat: Oropharynx is clear and moist.  Eyes: EOM are normal. Pupils  are equal, round, and reactive to light.  Neck: Normal range of motion. Neck supple.  Cardiovascular: Normal rate, regular rhythm and normal heart sounds.  Exam reveals no friction rub.   No murmur heard. Pulmonary/Chest: Effort normal and breath sounds normal. No respiratory distress. She has no wheezes. She has no rales.  Abdominal: Soft. There is no tenderness. There is no rebound and no guarding.  Musculoskeletal: Normal range of motion. She exhibits no edema and no tenderness.  No external signs of trauma no arm, no swelling, warmth or erythema. Full AROM with shoulder flex/ext. No pain with passive ROM. No TTP.   Lymphadenopathy:    She has no cervical adenopathy.  Neurological: She is alert and oriented to person, place, and time.  Skin: Skin is warm and dry. No rash noted.  Psychiatric: She has a normal mood and affect. Her behavior is normal.    ED Course  Procedures (including critical care time)  Labs Reviewed  CBC - Abnormal; Notable for the following:    RBC 3.47 (*)    Hemoglobin 10.0 (*)    HCT 29.2 (*)    All other components within normal limits  BASIC METABOLIC PANEL - Abnormal; Notable for the following:    Potassium 3.2 (*)    Glucose, Bld 125 (*)    GFR calc non Af Amer 70 (*)    GFR calc Af Amer 81 (*)    All other components within normal limits  POCT I-STAT TROPONIN I   Dg Chest 2 View  11/24/2012   *RADIOLOGY REPORT*  Clinical Data: Left arm pain for 3 days, history asthma, coronary artery disease post MI 4 months ago  CHEST - 2 VIEW  Comparison: 08/31/2012  Findings: Enlargement of cardiac silhouette. Tortuous aorta. Pulmonary vascularity normal. Lungs clear. No pleural effusion or pneumothorax. No acute osseous findings. Surgical clips right upper quadrant question  cholecystectomy.  IMPRESSION: Enlargement of cardiac silhouette. No acute abnormalities.   Original Report Authenticated By: Ulyses Southward, M.D.     Date: 11/24/2012  Rate: 83  Rhythm: normal sinus rhythm  QRS Axis: right  Intervals: normal  ST/T Wave abnormalities: nonspecific ST changes  Conduction Disutrbances:none  Narrative Interpretation: low voltage QRS  Old EKG Reviewed: unchanged   1. Left arm pain       MDM  73:19 PM 62 year old female with history of CAD status post anterior wall MI in February 2014 that led to a V. fib arrest status post PCI presenting with 2 days of intermittent left arm pain. Is also endorsed intermittent nausea. She denies shortness of breath or diaphoresis. She has a vague substernal chest "soreness" she states she's had multiple times in the past. She saw her cardiologist 2 days ago for similar symptoms, and at that time did not think was cardiac. Symptoms are not worse with exertion or activity. She took some "pain pills" which has relieved the pain. Pain is not relieved with nitroglycerin. Arm is nontender to palpation. No trauma.  12:09 AM labs with no significant abnormality. Tn neg. Care transferred pending delta tn with plans to f/u with her cardiologist as scheduled for further discussion of ICD placement.       Caren Hazy, MD 11/25/12 [phone]

## 2012-11-24 NOTE — ED Provider Notes (Signed)
I saw and evaluated the patient, reviewed the resident's note and I agree with the findings and plan.  Pt with left arm x 2 days without assoc anginal sx ( no sob, diaphoresis, or exertional component)--has a holter monitor and did call to have the rhythm checked and per pt it was nl--doubt acs, will cycle troponins and likely d/c  Toy Baker, MD 11/24/12 2258

## 2012-11-25 LAB — POCT I-STAT TROPONIN I: Troponin i, poc: 0.03 ng/mL (ref 0.00–0.08)

## 2012-11-26 NOTE — ED Provider Notes (Signed)
I saw and evaluated the patient, reviewed the resident's note and I agree with the findings and plan.  Veralyn Lopp T Seydina Holliman, MD 11/26/12 0632 

## 2012-11-27 ENCOUNTER — Telehealth (HOSPITAL_COMMUNITY): Payer: Self-pay | Admitting: Cardiac Rehabilitation

## 2012-11-27 ENCOUNTER — Encounter (HOSPITAL_COMMUNITY): Payer: PRIVATE HEALTH INSURANCE

## 2012-11-27 ENCOUNTER — Telehealth (HOSPITAL_COMMUNITY): Payer: Self-pay | Admitting: Family Medicine

## 2012-11-27 ENCOUNTER — Telehealth: Payer: Self-pay | Admitting: Internal Medicine

## 2012-11-27 DIAGNOSIS — I255 Ischemic cardiomyopathy: Secondary | ICD-10-CM

## 2012-11-27 DIAGNOSIS — I509 Heart failure, unspecified: Secondary | ICD-10-CM

## 2012-11-27 NOTE — Telephone Encounter (Signed)
New Prob     Pt would like to  Reschedule her implant with Dr. Graciela Husbands. Please call.

## 2012-11-27 NOTE — Telephone Encounter (Signed)
pc to assess reason for continued absence from cardiac rehab.  Pt reports she has knee pain and last week had GI upset.  Pt states she is feeling better today. She plans to return to rehab on 11/29/12.  Pt states her ICD implant was postponed due to her GI symptoms.

## 2012-11-28 ENCOUNTER — Encounter (HOSPITAL_COMMUNITY): Payer: Self-pay

## 2012-11-28 NOTE — Telephone Encounter (Signed)
I spoke with the patient and she has been rescheduled for her S-ICD implant with anesthesia on 7/3 with Dr. Graciela Husbands. She will have repeat lab work on 6/27.

## 2012-11-28 NOTE — Telephone Encounter (Signed)
New Problem  Pt states that it is urgent that she speaks with you this morning. She said if you can not reach her at home, please call her cell. She would not say what it was concerning.

## 2012-11-29 ENCOUNTER — Encounter (HOSPITAL_COMMUNITY)
Admission: RE | Admit: 2012-11-29 | Discharge: 2012-11-29 | Disposition: A | Discharge status: Home / Self Care | Payer: PRIVATE HEALTH INSURANCE | Source: Ambulatory Visit | Attending: Cardiovascular Disease | Admitting: Cardiovascular Disease

## 2012-11-29 NOTE — Progress Notes (Signed)
Pt returned to cardiac rehab today after absence for GI illness.  Pt states symptoms are relieved.  Her ICD implant has been r/s for 12/07/12.

## 2012-12-01 ENCOUNTER — Other Ambulatory Visit: Payer: PRIVATE HEALTH INSURANCE

## 2012-12-01 ENCOUNTER — Encounter (HOSPITAL_COMMUNITY)
Admission: RE | Admit: 2012-12-01 | Discharge: 2012-12-01 | Disposition: A | Discharge status: Home / Self Care | Payer: PRIVATE HEALTH INSURANCE | Source: Ambulatory Visit | Attending: Cardiovascular Disease | Admitting: Cardiovascular Disease

## 2012-12-04 ENCOUNTER — Encounter (HOSPITAL_COMMUNITY)
Admission: RE | Admit: 2012-12-04 | Discharge: 2012-12-04 | Disposition: A | Discharge status: Home / Self Care | Payer: PRIVATE HEALTH INSURANCE | Source: Ambulatory Visit | Attending: Cardiovascular Disease | Admitting: Cardiovascular Disease

## 2012-12-06 ENCOUNTER — Other Ambulatory Visit: Payer: Self-pay | Admitting: Internal Medicine

## 2012-12-06 ENCOUNTER — Other Ambulatory Visit (INDEPENDENT_AMBULATORY_CARE_PROVIDER_SITE_OTHER): Payer: PRIVATE HEALTH INSURANCE

## 2012-12-06 ENCOUNTER — Encounter (HOSPITAL_COMMUNITY)
Admission: RE | Admit: 2012-12-06 | Discharge: 2012-12-06 | Disposition: A | Discharge status: Home / Self Care | Payer: PRIVATE HEALTH INSURANCE | Source: Ambulatory Visit | Attending: Cardiovascular Disease | Admitting: Cardiovascular Disease

## 2012-12-06 DIAGNOSIS — I428 Other cardiomyopathies: Secondary | ICD-10-CM

## 2012-12-06 DIAGNOSIS — I2510 Atherosclerotic heart disease of native coronary artery without angina pectoris: Secondary | ICD-10-CM | POA: Insufficient documentation

## 2012-12-06 DIAGNOSIS — I255 Ischemic cardiomyopathy: Secondary | ICD-10-CM

## 2012-12-06 DIAGNOSIS — I252 Old myocardial infarction: Secondary | ICD-10-CM | POA: Insufficient documentation

## 2012-12-06 DIAGNOSIS — I259 Chronic ischemic heart disease, unspecified: Secondary | ICD-10-CM | POA: Insufficient documentation

## 2012-12-06 DIAGNOSIS — Z5189 Encounter for other specified aftercare: Secondary | ICD-10-CM | POA: Insufficient documentation

## 2012-12-06 DIAGNOSIS — I2589 Other forms of chronic ischemic heart disease: Secondary | ICD-10-CM

## 2012-12-06 DIAGNOSIS — I509 Heart failure, unspecified: Secondary | ICD-10-CM

## 2012-12-06 DIAGNOSIS — E785 Hyperlipidemia, unspecified: Secondary | ICD-10-CM | POA: Insufficient documentation

## 2012-12-06 LAB — BASIC METABOLIC PANEL
GFR: 70.94 mL/min (ref >60.00)
Potassium: 4.3 mEq/L (ref 3.5–5.1)
Sodium: 141 mEq/L (ref 135–145)

## 2012-12-06 LAB — CBC WITH DIFFERENTIAL/PLATELET
Eosinophils Relative: 1.6 % (ref 0.0–5.0)
HCT: 32.7 % — ABNORMAL LOW (ref 36.0–46.0)
Hemoglobin: 10.6 g/dL — ABNORMAL LOW (ref 12.0–15.0)
Lymphs Abs: 1.7 10*3/uL (ref 0.7–4.0)
Monocytes Relative: 6.4 % (ref 3.0–12.0)
Neutro Abs: 3 10*3/uL (ref 1.4–7.7)
WBC: 5.1 10*3/uL (ref 4.5–10.5)

## 2012-12-06 LAB — PROTIME-INR: Prothrombin Time: 12.2 s (ref 10.2–12.4)

## 2012-12-06 MED ORDER — SODIUM CHLORIDE 0.9 % IR SOLN
80.0000 mg | Status: DC
  Filled 2012-12-06: qty 2

## 2012-12-06 MED ORDER — CEFAZOLIN SODIUM-DEXTROSE 2-3 GM-% IV SOLR
2.0000 g | INTRAVENOUS | Status: AC
  Administered 2012-12-07: 2 g via INTRAVENOUS
  Filled 2012-12-06 (×2): qty 50

## 2012-12-07 ENCOUNTER — Encounter (HOSPITAL_COMMUNITY): Payer: Self-pay | Admitting: Certified Registered"

## 2012-12-07 ENCOUNTER — Ambulatory Visit (HOSPITAL_COMMUNITY): Payer: PRIVATE HEALTH INSURANCE | Admitting: Anesthesiology

## 2012-12-07 ENCOUNTER — Ambulatory Visit (HOSPITAL_COMMUNITY)
Admission: RE | Admit: 2012-12-07 | Discharge: 2012-12-08 | Disposition: A | Discharge status: Home / Self Care | Payer: PRIVATE HEALTH INSURANCE | Source: Ambulatory Visit | Attending: Internal Medicine | Admitting: Internal Medicine

## 2012-12-07 ENCOUNTER — Encounter (HOSPITAL_COMMUNITY)
Admission: RE | Disposition: A | Discharge status: Home / Self Care | Payer: Self-pay | Source: Ambulatory Visit | Attending: Internal Medicine

## 2012-12-07 ENCOUNTER — Encounter (HOSPITAL_COMMUNITY): Payer: Self-pay | Admitting: Anesthesiology

## 2012-12-07 DIAGNOSIS — I2510 Atherosclerotic heart disease of native coronary artery without angina pectoris: Secondary | ICD-10-CM | POA: Insufficient documentation

## 2012-12-07 DIAGNOSIS — Z9861 Coronary angioplasty status: Secondary | ICD-10-CM | POA: Insufficient documentation

## 2012-12-07 DIAGNOSIS — N393 Stress incontinence (female) (male): Secondary | ICD-10-CM | POA: Insufficient documentation

## 2012-12-07 DIAGNOSIS — I252 Old myocardial infarction: Secondary | ICD-10-CM | POA: Insufficient documentation

## 2012-12-07 DIAGNOSIS — E785 Hyperlipidemia, unspecified: Secondary | ICD-10-CM | POA: Insufficient documentation

## 2012-12-07 DIAGNOSIS — I2589 Other forms of chronic ischemic heart disease: Secondary | ICD-10-CM

## 2012-12-07 DIAGNOSIS — I10 Essential (primary) hypertension: Secondary | ICD-10-CM | POA: Insufficient documentation

## 2012-12-07 DIAGNOSIS — I509 Heart failure, unspecified: Secondary | ICD-10-CM | POA: Insufficient documentation

## 2012-12-07 DIAGNOSIS — Z8674 Personal history of sudden cardiac arrest: Secondary | ICD-10-CM | POA: Insufficient documentation

## 2012-12-07 DIAGNOSIS — M1710 Unilateral primary osteoarthritis, unspecified knee: Secondary | ICD-10-CM | POA: Insufficient documentation

## 2012-12-07 DIAGNOSIS — Z79899 Other long term (current) drug therapy: Secondary | ICD-10-CM | POA: Insufficient documentation

## 2012-12-07 DIAGNOSIS — Z7902 Long term (current) use of antithrombotics/antiplatelets: Secondary | ICD-10-CM | POA: Insufficient documentation

## 2012-12-07 DIAGNOSIS — D649 Anemia, unspecified: Secondary | ICD-10-CM | POA: Insufficient documentation

## 2012-12-07 DIAGNOSIS — I5022 Chronic systolic (congestive) heart failure: Secondary | ICD-10-CM | POA: Insufficient documentation

## 2012-12-07 DIAGNOSIS — G589 Mononeuropathy, unspecified: Secondary | ICD-10-CM | POA: Insufficient documentation

## 2012-12-07 DIAGNOSIS — I428 Other cardiomyopathies: Secondary | ICD-10-CM

## 2012-12-07 DIAGNOSIS — J45909 Unspecified asthma, uncomplicated: Secondary | ICD-10-CM | POA: Insufficient documentation

## 2012-12-07 DIAGNOSIS — K219 Gastro-esophageal reflux disease without esophagitis: Secondary | ICD-10-CM | POA: Insufficient documentation

## 2012-12-07 HISTORY — PX: IMPLANTABLE CARDIOVERTER DEFIBRILLATOR IMPLANT: SHX5473

## 2012-12-07 HISTORY — DX: Family history of other specified conditions: Z84.89

## 2012-12-07 HISTORY — DX: Acute myocardial infarction, unspecified: I21.9

## 2012-12-07 LAB — SURGICAL PCR SCREEN: Staphylococcus aureus: NEGATIVE

## 2012-12-07 SURGERY — IMPLANTABLE CARDIOVERTER DEFIBRILLATOR IMPLANT
Anesthesia: Monitor Anesthesia Care

## 2012-12-07 MED ORDER — SODIUM CHLORIDE 0.9 % IV SOLN
INTRAVENOUS | Status: DC
  Administered 2012-12-07: 08:00:00 via INTRAVENOUS

## 2012-12-07 MED ORDER — SODIUM CHLORIDE 0.9 % IV SOLN
INTRAVENOUS | Status: DC | PRN
  Administered 2012-12-07 (×2): via INTRAVENOUS

## 2012-12-07 MED ORDER — PRASUGREL HCL 10 MG PO TABS
10.0000 mg | ORAL_TABLET | Freq: Every day | ORAL | Status: DC
Start: 2012-12-08 — End: 2012-12-08
  Administered 2012-12-08: 10 mg via ORAL
  Filled 2012-12-07: qty 1

## 2012-12-07 MED ORDER — MORPHINE SULFATE 4 MG/ML IJ SOLN: 4.0000 mg | INTRAMUSCULAR | Status: DC | PRN

## 2012-12-07 MED ORDER — PANTOPRAZOLE SODIUM 40 MG PO TBEC
40.0000 mg | DELAYED_RELEASE_TABLET | Freq: Two times a day (BID) | ORAL | Status: DC
  Administered 2012-12-07 – 2012-12-08 (×2): 40 mg via ORAL
  Filled 2012-12-07 (×2): qty 1

## 2012-12-07 MED ORDER — PROMETHAZINE HCL 25 MG/ML IJ SOLN
12.5000 mg | Freq: Four times a day (QID) | INTRAMUSCULAR | Status: DC | PRN
  Filled 2012-12-07 (×2): qty 1

## 2012-12-07 MED ORDER — ASPIRIN EC 81 MG PO TBEC
81.0000 mg | DELAYED_RELEASE_TABLET | Freq: Every day | ORAL | Status: DC
  Administered 2012-12-08: 81 mg via ORAL
  Filled 2012-12-07: qty 1

## 2012-12-07 MED ORDER — SPIRONOLACTONE 12.5 MG HALF TABLET
12.5000 mg | ORAL_TABLET | Freq: Every day | ORAL | Status: DC
  Administered 2012-12-07 – 2012-12-08 (×2): 12.5 mg via ORAL
  Filled 2012-12-07 (×2): qty 1

## 2012-12-07 MED ORDER — MIDAZOLAM HCL 5 MG/5ML IJ SOLN
INTRAMUSCULAR | Status: DC | PRN
  Administered 2012-12-07: 2 mg via INTRAVENOUS

## 2012-12-07 MED ORDER — PROMETHAZINE HCL 12.5 MG PO TABS
12.5000 mg | ORAL_TABLET | Freq: Four times a day (QID) | ORAL | Status: DC | PRN
  Filled 2012-12-07 (×2): qty 1

## 2012-12-07 MED ORDER — OXYCODONE HCL 5 MG PO TABS
5.0000 mg | ORAL_TABLET | ORAL | Status: DC | PRN
  Administered 2012-12-07 – 2012-12-08 (×4): 5 mg via ORAL
  Filled 2012-12-07 (×4): qty 1

## 2012-12-07 MED ORDER — LOSARTAN POTASSIUM 25 MG PO TABS
25.0000 mg | ORAL_TABLET | Freq: Every day | ORAL | Status: DC
  Administered 2012-12-08: 25 mg via ORAL
  Filled 2012-12-07: qty 1

## 2012-12-07 MED ORDER — ATORVASTATIN CALCIUM 80 MG PO TABS
80.0000 mg | ORAL_TABLET | Freq: Every evening | ORAL | Status: DC
  Administered 2012-12-07 – 2012-12-08 (×2): 80 mg via ORAL
  Filled 2012-12-07 (×2): qty 1

## 2012-12-07 MED ORDER — ONDANSETRON HCL 4 MG/2ML IJ SOLN
INTRAMUSCULAR | Status: DC | PRN
  Administered 2012-12-07: 4 mg via INTRAVENOUS

## 2012-12-07 MED ORDER — MUPIROCIN 2 % EX OINT
TOPICAL_OINTMENT | CUTANEOUS | Status: AC
Start: 2012-12-07 — End: 2012-12-07
  Filled 2012-12-07: qty 22

## 2012-12-07 MED ORDER — ETOMIDATE 2 MG/ML IV SOLN
INTRAVENOUS | Status: DC | PRN
  Administered 2012-12-07: 12 mg via INTRAVENOUS

## 2012-12-07 MED ORDER — NITROGLYCERIN 0.4 MG SL SUBL: 0.4000 mg | SUBLINGUAL_TABLET | SUBLINGUAL | Status: DC | PRN

## 2012-12-07 MED ORDER — LIDOCAINE HCL (PF) 1 % IJ SOLN
INTRAMUSCULAR | Status: AC
  Filled 2012-12-07: qty 60

## 2012-12-07 MED ORDER — MORPHINE SULFATE 4 MG/ML IJ SOLN
4.0000 mg | INTRAMUSCULAR | Status: DC | PRN
  Administered 2012-12-07 (×2): 4 mg via INTRAVENOUS
  Administered 2012-12-08: 2 mg via INTRAVENOUS
  Administered 2012-12-08 (×2): 4 mg via INTRAVENOUS
  Filled 2012-12-07 (×5): qty 1

## 2012-12-07 MED ORDER — ALBUTEROL SULFATE HFA 108 (90 BASE) MCG/ACT IN AERS
2.0000 | INHALATION_SPRAY | RESPIRATORY_TRACT | Status: DC | PRN
  Filled 2012-12-07: qty 6.7

## 2012-12-07 MED ORDER — CARVEDILOL 12.5 MG PO TABS
12.5000 mg | ORAL_TABLET | Freq: Two times a day (BID) | ORAL | Status: DC
  Administered 2012-12-07 – 2012-12-08 (×2): 12.5 mg via ORAL
  Filled 2012-12-07 (×3): qty 1

## 2012-12-07 MED ORDER — FUROSEMIDE 40 MG PO TABS
40.0000 mg | ORAL_TABLET | Freq: Every day | ORAL | Status: DC
  Administered 2012-12-08: 40 mg via ORAL
  Filled 2012-12-07 (×2): qty 1

## 2012-12-07 MED ORDER — MORPHINE BOLUS VIA INFUSION: 4.0000 mg | INTRAVENOUS | Status: DC | PRN

## 2012-12-07 MED ORDER — MUPIROCIN 2 % EX OINT
TOPICAL_OINTMENT | Freq: Two times a day (BID) | CUTANEOUS | Status: DC
  Administered 2012-12-07: 08:00:00 via NASAL
  Filled 2012-12-07: qty 22

## 2012-12-07 MED ORDER — CHLORHEXIDINE GLUCONATE 4 % EX LIQD
60.0000 mL | Freq: Once | CUTANEOUS | Status: DC
  Filled 2012-12-07: qty 60

## 2012-12-07 MED ORDER — FENTANYL CITRATE 0.05 MG/ML IJ SOLN
INTRAMUSCULAR | Status: DC | PRN
  Administered 2012-12-07 (×2): 25 ug via INTRAVENOUS
  Administered 2012-12-07: 100 ug via INTRAVENOUS
  Administered 2012-12-07 (×4): 25 ug via INTRAVENOUS

## 2012-12-07 NOTE — CV Procedure (Signed)
Wendy Reid [id number]  [id number]  Preop WU:[id number] cardiomyopathy Postop Dx same/   Procedure:subcutaneous ICD insertion  Cx: None   Dictation number font remember  Sherryl Manges, MD 12/07/2012 2:11 PM

## 2012-12-07 NOTE — Preoperative (Signed)
Beta Blockers   Reason not to administer Beta Blockers:Not Applicable 

## 2012-12-07 NOTE — Plan of Care (Signed)
Problem: Phase II Progression Outcomes Goal: Site without bleeding or hematoma Outcome: Progressing Patient with bleeding from sternal site and left axillary site.  EP lab personal up to redress sternum.

## 2012-12-07 NOTE — Transfer of Care (Signed)
Immediate Anesthesia Transfer of Care Note  Patient: Wendy Reid  Procedure(s) Performed: Procedure(s): IMPLANTABLE CARDIOVERTER DEFIBRILLATOR IMPLANT (N/A)  Patient Location: PACU  Anesthesia Type:General  Level of Consciousness: awake, alert , oriented and patient cooperative  Airway & Oxygen Therapy: Patient Spontanous Breathing and Patient connected to nasal cannula oxygen  Post-op Assessment: Report given to PACU RN, Post -op Vital signs reviewed and stable and Patient moving all extremities  Post vital signs: Reviewed and stable  Complications: No apparent anesthesia complications

## 2012-12-07 NOTE — Progress Notes (Signed)
Pt with moderate soreness  BP in the 90-100 which is where she "lives"  Arm felt better with passive manipulation  Continue pain meds Will check cbc in am

## 2012-12-07 NOTE — Op Note (Signed)
NAMEMarland Kitchen  Wendy Reid, Wendy NO.:  [id number]  MEDICAL RECORD NO.:  [id number]  LOCATION:  [id number]                        FACILITY:  [facility]  PHYSICIAN:  Duke Salvia, MD, FACCDATE OF BIRTH:  06-12-1950  DATE OF PROCEDURE:  12/07/2012 DATE OF DISCHARGE:                              OPERATIVE REPORT   PREOPERATIVE DIAGNOSIS:  Ischemic cardiomyopathy, congestive heart failure, and previous ventricular arrhythmia.  POSTOPERATIVE DIAGNOSIS:  Ischemic cardiomyopathy, congestive heart failure, and previous ventricular arrhythmia.  PROCEDURES:  Implantation of subcutaneous implantable defibrillator with intraoperative defibrillation threshold testing.  DESCRIPTION OF PROCEDURE:  Following obtaining informed consent, the patient was brought to electrophysiology laboratory and placed on the fluoroscopic table in supine position.  After routine prep and drape, incision was made in the inframammary groove on the left side.  I had little bit of problems using the smaller blade and I ended up having little skip in the incision.  We ultimately made the incision of about 4 inches in length and dissected down to the fascia.  At this point, we began to dissect along the fascia posteriorly and cephalad to form a pocket for the implantable defibrillator.  This turned out to be quite a challenge and there was a modest amount of bleeding.  Ultimately, the pocket was configured and it was packed.  We then made an incision in the subxiphoid area and this was carried down to layer of the fascia as well.  A tunneling tool was then deployed from the infrasternal incision to the inferior anterior aspect of the pocket along the fascial plane.  At this juncture, a [facility] [id number] defibrillator lead, serial# [id number] was tied to the tunneler and moved from the pocket to the subxiphoid incision.  Prior to this, I should note that there were 2-1 silk sutures placed about 1.5 cm apart in  the subxiphoid incision and allowed to hang loosely.  The lead having been removed from the pocket to the subxiphoid incision, the anchoring sleeve was secured to the device about a 1 cm proximal to the coil.  At this point, the substernal target site was assessed and was made in a vertical plane down to the layer of the fascia sitting upon the sternum. A single suture was placed in the cephalad aspect of this incision and the lead was then tunneled from the subxiphoid pocket to the substernal pocket.  The retaining sutures were removed and the anchoring suture was applied securing the securing the lead to the substernal incision fascial plane.  I should note that prior to this, the lead had been secured in the subxiphoid pocket with the previously placed anchoring sutures.  We then went back to the axillary pocket.  It required further posterior and cephalad dissection to house the defibrillator.  It was then interrupted by bleeding from small arterial, which was then controlled. An anchoring suture was placed at the cephalad anterior aspect of the pocket.  Hemostasis having been obtained.  It was used to secure a [facility] [id number] subcutaneous defibrillator generator serial [id number].  This anchoring suture was then used to secure the device in the pocket in its most cephalad in a position that was superior to the subxiphoid incision and  basically in the mid axillary line.  We then closed all 3 incisions in the first layer.  The pockets were copiously irrigated with antibiotic containing saline solution prior to that, and Surgicel was placed in the defibrillator pocket.  Ventricular fibrillation was then induced via the high frequency burst. After total of 17 seconds and 10 seconds for total time to therapy a 65 joule shock was delivered in the standard configuration through measured resistance of 57 ohms terminating ventricular fibrillation and restoring sinus rhythm.  At this  point, the device was implanted.  This basically included the closing of the 3 incisions.  The patient tolerated all this without apparent complication.  Needle counts, sponge counts, and instrument counts were correct at the end of procedure according to staff.  The patient tolerated it all.     Duke Salvia, MD, [facility]     SCK/MEDQ  D:  12/07/2012  T:  12/07/2012  Job:  [id number]

## 2012-12-07 NOTE — H&P (View-Only) (Signed)
Pt presented for procedure today with new onset nausea  We have elected to postpone the procedure so taht she can get that evaluated

## 2012-12-07 NOTE — Anesthesia Procedure Notes (Signed)
Procedure Name: LMA Insertion Date/Time: 12/07/2012 10:53 AM Performed by: Jerilee Hoh Pre-anesthesia Checklist: Patient identified, Emergency Drugs available, Suction available and Patient being monitored Patient Re-evaluated:Patient Re-evaluated prior to inductionOxygen Delivery Method: Circle system utilized Preoxygenation: Pre-oxygenation with 100% oxygen Intubation Type: IV induction Ventilation: Mask ventilation without difficulty LMA: LMA inserted LMA Size: 4.0 Number of attempts: 1 Placement Confirmation: positive ETCO2 and breath sounds checked- equal and bilateral Tube secured with: Tape Dental Injury: Teeth and Oropharynx as per pre-operative assessment

## 2012-12-07 NOTE — Progress Notes (Signed)
Patient with oozing to upper sternal incision and lower sternal incision when admitted from cath lab.  [name] PA paged and notified.  [name] with EP up to see patient and change upper sternal dressing and applied pressure dressing. Dr. Graciela Husbands up to see patient this evening.  Will continue to monitor.  Wendy Reid

## 2012-12-07 NOTE — Interval H&P Note (Signed)
History and Physical Interval Note:  12/07/2012 9:10 AM  Wendy Reid  has presented today for surgery, with the diagnosis of icm, chf  The various methods of treatment have been discussed with the patient and family. After consideration of risks, benefits and other options for treatment, the patient has consented to  Procedure(s): IMPLANTABLE CARDIOVERTER DEFIBRILLATOR IMPLANT (N/A) as a surgical intervention .  The patient's history has been reviewed, patient examined, no change in status, stable for surgery.  I have reviewed the patient's chart and labs.  Questions were answered to the patient's satisfaction.     Sherryl Manges  Nausea previously postponed procedure, bu thtat has resolved  No sob or chest pain, wearing lifevest

## 2012-12-08 ENCOUNTER — Encounter (HOSPITAL_COMMUNITY): Payer: Self-pay | Admitting: Physician Assistant

## 2012-12-08 ENCOUNTER — Encounter (HOSPITAL_COMMUNITY): Payer: PRIVATE HEALTH INSURANCE

## 2012-12-08 DIAGNOSIS — I428 Other cardiomyopathies: Secondary | ICD-10-CM

## 2012-12-08 LAB — CBC
HCT: 26.9 % — ABNORMAL LOW (ref 36.0–46.0)
Hemoglobin: 8.8 g/dL — ABNORMAL LOW (ref 12.0–15.0)
MCH: 28.2 pg (ref 26.0–34.0)
MCV: 86.2 fL (ref 78.0–100.0)
Platelets: 267 10*3/uL (ref 150–400)
RBC: 3.12 MIL/uL — ABNORMAL LOW (ref 3.87–5.11)
WBC: 7.2 10*3/uL (ref 4.0–10.5)

## 2012-12-08 NOTE — Discharge Summary (Signed)
Discharge Summary   Patient ID: Wendy Reid MRN: [id number], DOB/AGE: [birth year] 62 y.o. Admit date: 12/07/2012 D/C date:     12/08/2012  Primary Cardiologist: Cooper/Klein  Primary Discharge Diagnoses:  1. Chronic systolic CHF s/p SQ ICD 12/07/12  Secondary Discharge Diagnoses:  Past Medical History  Diagnosis Date  . Asthma   . GERD (gastroesophageal reflux disease)   . Hypertension   . 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 Barrett's esophagus with esophagitis 11/01/2011    Patient states no evidence of barrett's from last surveillance EGD on 11/2010  . Stress bladder incontinence, female 11/01/2011  . 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 Complex regional pain syndrome of lower limb   . Coronary artery disease     STEMI 07/2012 Cath totally occluded prox LAD s/p DES, otherwise nonobstructive dz in RCA and LCx, EF 30%  . Systolic CHF     a. Echo 07/22/12 EF 30-35%, grade 1 diastolic dysfunction, akinesis of the mid-distalanterior and apical myocardium b. Echo 09/01/12: EF 25%, multiple WMAs suggesting LAD infarction, normal RV size and systolic function and a small circumferential pericardial effusion;  c. Echo 5/14:  EF 25%, mid and dist ant, septum apex and inf apex AK, mild MR, small effusion. b. s/p SQ ICD 12/07/12.  . Cardiac arrest     V.Fib arrest 07/2012 in the setting of acute anterior STEMI; Discharged with [facility]. s/p SQ ICD 12/07/12  . Hyperlipidemia   . Environmental allergies     Uses albuterol due to wheezing.  . Ischemic cardiomyopathy   . Family history of anesthesia complication     DIFFICULT FOR SON TO WAKE UP "  . Myocardial infarction 07/21/2012     Hospital Course: Wendy Reid is a 62 y/o F with history of known ischemic heart disease and had an acute anterior wall myocardial infarction complicated by ventricular fibrillation and cardiac arrest in February 2014 and was treated with primary PCI. She was discharged home with a life  test. She has been rehospitalized twice, once for atypical chest pain and the second time for shortness of breath. She had a recent echocardiogram which shows that she still has significant left ventricular dysfunction with estimated ejection fraction of 25% on echo done on 10/18/12. The mid and distal anterior wall, septum, apex, and inferior apex are akinetic. The cavity size was severely dilated and wall thickness was normal. She has been felt to be a candidate for ICD implantation. She presented 12/07/12 to [facility] to receive ICD and underwent successful SQ ICD implantation by Dr. Graciela Husbands. Today she is feeling well. Dr. Ladona Ridgel has seen and examined the patient today and feels she is stable for discharge. EP team already scheduled wound check. I left msg with scheduler voicemail for f/u Dr. Graciela Husbands in 3 months. Of note, labs this admission showed anemia. She may require attention to this on follow-up. She was instructed to discuss at her wound check visit. She was also instructed to call immediately if any bleeding is noticed.  Discharge Vitals: Blood pressure 115/65, pulse 81, temperature 97.3 F (36.3 C), temperature source Oral, resp. rate 18, height 5\' 1"  (1.549 m), weight 240 lb (108.863 kg), SpO2 97.00%.  Labs: Lab Results  Component Value Date   WBC 7.2 12/08/2012   HGB 8.8* 12/08/2012   HCT 26.9* 12/08/2012   MCV 86.2 12/08/2012   PLT 267 12/08/2012     Recent Labs Lab 12/06/12 1255  NA 141  K 4.3  CL 105  CO2 22  BUN 13  CREATININE 1.0  CALCIUM 10.2  GLUCOSE 100*    Lab Results  Component Value Date   CHOL 124 08/16/2012   HDL 48.00 08/16/2012   LDLCALC 55 08/16/2012   TRIG 105.0 08/16/2012     Diagnostic Studies/Procedures  ICD implant as above  Dg Chest 2 View 11/24/2012   *RADIOLOGY REPORT*  Clinical Data: Left arm pain for 3 days, history asthma, coronary artery disease post MI 4 months ago  CHEST - 2 VIEW  Comparison: 08/31/2012  Findings: Enlargement of cardiac silhouette.  Tortuous aorta. Pulmonary vascularity normal. Lungs clear. No pleural effusion or pneumothorax. No acute osseous findings. Surgical clips right upper quadrant question cholecystectomy.  IMPRESSION: Enlargement of cardiac silhouette. No acute abnormalities.   Original Report Authenticated By: Ulyses Southward, M.D.    Discharge Medications     Medication List         albuterol (2.5 MG/3ML) 0.083% nebulizer solution  Commonly known as:  PROVENTIL  Take 2.5 mg by nebulization every 4 (four) hours as needed for wheezing or shortness of breath.     albuterol 108 (90 BASE) MCG/ACT inhaler  Commonly known as:  PROVENTIL HFA;VENTOLIN HFA  Inhale 2 puffs into the lungs every 4 (four) hours as needed for wheezing or shortness of breath.     aspirin EC 81 MG tablet  Take 81 mg by mouth daily.     atorvastatin 80 MG tablet  Commonly known as:  LIPITOR  Take 80 mg by mouth every evening.     carvedilol 12.5 MG tablet  Commonly known as:  COREG  Take 12.5 mg by mouth 2 (two) times daily.     chlorpheniramine-HYDROcodone 10-8 MG/5ML Lqcr  Commonly known as:  TUSSIONEX  Take 5 mLs by mouth every 12 (twelve) hours as needed. For cough     fexofenadine 180 MG tablet  Commonly known as:  ALLEGRA  Take 180 mg by mouth daily as needed (for allergies). For allergies     fluticasone 50 MCG/ACT nasal spray  Commonly known as:  FLONASE  Place 2 sprays into the nose daily as needed for allergies. For allergies     furosemide 40 MG tablet  Commonly known as:  LASIX  Take 1 tablet (40 mg total) by mouth daily.     losartan 25 MG tablet  Commonly known as:  COZAAR  Take 1 tablet (25 mg total) by mouth daily.     nitroGLYCERIN 0.4 MG SL tablet  Commonly known as:  NITROSTAT  Place 0.4 mg under the tongue every 5 (five) minutes as needed for chest pain. x3 doses as needed for chest pain     oxyCODONE 5 MG immediate release tablet  Commonly known as:  Oxy IR/ROXICODONE  Take 5 mg by mouth every 6  (six) hours as needed for pain. For pain     pantoprazole 40 MG tablet  Commonly known as:  PROTONIX  Take 40 mg by mouth 2 (two) times daily.     prasugrel 10 MG Tabs  Commonly known as:  EFFIENT  Take 10 mg by mouth daily.     spironolactone 25 MG tablet  Commonly known as:  ALDACTONE  Take 0.5 tablets (12.5 mg total) by mouth daily.        Disposition   The patient will be discharged in stable condition to home. Discharge Orders   Future Appointments Provider Department Dept Phone  12/18/2012 10:00 AM [facility] [employer] Main Office [place]) [phone]   01/26/2013 12:15 PM Tonny Bollman, MD [facility] Main Office [place]) [phone]   Future Orders Complete By Expires     Diet - low sodium heart healthy  As directed     Increase activity slowly  As directed     Comments:      Please see attached sheet at the end of your After-Visit Summary for instructions on wound care, activity, and bathing.      Follow-up Information   Follow up with [facility]. (Wound check 12/18/12 at 10am. You were mildly anemic by your labwork this admission - please discuss at this appointment if they want you to have repeat bloodwork. Call your doctor immediately if you notice any unusual bleeding.)    Contact information:   [address] [place] [address] [phone]      Follow up with Tonny Bollman, MD. (01/26/13 at 12:15pm)    Contact information:   [address] [place] [address] [phone]       Follow up with Sherryl Manges, MD. (Our office will call you for a follow-up appointment. Please call the office if you have not heard from [place] within 5 days.)    Contact information:   [address] [place] [address] [phone]         Duration of Discharge Encounter: Greater than 30 minutes including physician and PA time.  Signed, Sherrell Weir PA-C 12/08/2012, 11:40 AM

## 2012-12-08 NOTE — Progress Notes (Signed)
Patient ID: Wendy Reid, female   DOB: 05/08/1951, 62 y.o.   MRN: [id number] Subjective:  No chest pain. Moderate incisional soreness  Objective:  Vital Signs in the last 24 hours: Temp:  [97.3 F (36.3 C)-98 F (36.7 C)] 97.3 F (36.3 C) (07/04 0626) Pulse Rate:  [56-73] 73 (07/04 0626) Resp:  [18] 18 (07/04 0626) BP: (91-110)/(53-82) 110/73 mmHg (07/04 0626) SpO2:  [97 %] 97 % (07/04 0626)  Intake/Output from previous day: 07/03 0701 - 07/04 0700 In: 1200 [I.V.:1200] Out: 50 [Blood:50] Intake/Output from this shift: Total I/O In: 240 [P.O.:240] Out: -   Physical Exam: Well appearing NAD HEENT: Unremarkable Neck:  No JVD, no thyromegally Lymphatics:  No adenopathy Back:  No CVA tenderness Lungs:  Clear, with no wheezes. No hematoma, bleeding or drainage from incision sites. HEART:  Regular rate rhythm, no murmurs, no rubs, no clicks Abd:  obese, positive bowel sounds, no organomegally, no rebound, no guarding Ext:  2 plus pulses, no edema, no cyanosis, no clubbing Skin:  No rashes no nodules Neuro:  CN II through XII intact, motor grossly intact  Lab Results:  Recent Labs  12/06/12 1255 12/08/12 0515  WBC 5.1 7.2  HGB 10.6* 8.8*  PLT 255.0 267    Recent Labs  12/06/12 1255  NA 141  K 4.3  CL 105  CO2 22  GLUCOSE 100*  BUN 13  CREATININE 1.0   No results found for this basename: TROPONINI, CK, MB,  in the last 72 hours Hepatic Function Panel No results found for this basename: PROT, ALBUMIN, AST, ALT, ALKPHOS, BILITOT, BILIDIR, IBILI,  in the last 72 hours No results found for this basename: CHOL,  in the last 72 hours No results found for this basename: PROTIME,  in the last 72 hours  Imaging: No results found.  Cardiac Studies: Tele - nsr Assessment/Plan:  1. Chronic systolic CHF 2. S/p SQ ICD Rec: ok for discharge home. She has narcotic pain meds at home. Usual followup  LOS: 1 day    Sharin Altidor,M.D. 12/08/2012, 9:33 AM

## 2012-12-11 MED FILL — Morphine Sulfate Inj 2 MG/ML: INTRAMUSCULAR | Qty: 2 | Status: AC

## 2012-12-12 ENCOUNTER — Telehealth: Payer: Self-pay | Admitting: Internal Medicine

## 2012-12-12 MED ORDER — LEVOFLOXACIN 500 MG PO TABS: 500.0000 mg | ORAL_TABLET | Freq: Every day | ORAL | Status: DC

## 2012-12-12 NOTE — Telephone Encounter (Signed)
Spoke with pt, she called her PCP and they requested we take care of this for the pt. Will discuss with dr Graciela Husbands

## 2012-12-12 NOTE — Telephone Encounter (Signed)
Follow-up: ° ° ° °Patient called in returning your call.  Please call back. °

## 2012-12-12 NOTE — Telephone Encounter (Signed)
Spoke with pt, aware script will be called into the pharm.

## 2012-12-12 NOTE — Telephone Encounter (Signed)
Spoke with pt, she has had a low grade fever, 100.1 to 100.7, for the last two days. The pressure drsg over the device incision is itching badly and the pt was told she could remove that drsg. The steri strips are still intact. There is no swelling, drainage or redness at the insertion site. She has a productive cough of yellow tinged sputum. Discussed with paula odell, device nurse, pt will cont to monitor incision site. She was told to call her PCP regarding the cough and fever.

## 2012-12-12 NOTE — Telephone Encounter (Signed)
Discussed with dr Graciela Husbands, pt to take leviquin 500 mg once daily x 7 days. Left message for pt to call

## 2012-12-12 NOTE — Telephone Encounter (Signed)
New problem  Pt states she believes she is running a fever.

## 2012-12-14 ENCOUNTER — Telehealth: Payer: Self-pay | Admitting: Internal Medicine

## 2012-12-14 NOTE — Telephone Encounter (Signed)
New problem  Pt states that she has misplaced her instruction sheet on what she is suppose to do following her surgery. She asked if you could give her a call back when you get a chance.

## 2012-12-14 NOTE — Telephone Encounter (Signed)
Spoke with pt, she had her device placed on the 3rd of July, she wonders if she can shower today. Per dr Graciela Husbands she can shower today with the steri strips in place. Pt voiced understanding.

## 2012-12-15 ENCOUNTER — Telehealth: Payer: Self-pay | Admitting: Cardiovascular Disease

## 2012-12-15 NOTE — Telephone Encounter (Signed)
New Prob      Pt states she is experiencing some light headedness and would like to be seen before available 7/29 openings. Please call.

## 2012-12-15 NOTE — Telephone Encounter (Signed)
I spoke with the pt and she complains of being light headed with walking and position changes.  This symptom is not new and has been on going for some time.  The pt said this is a mild occurrence but the frequency has increased.  The pt denies any palpitations or ICD shocks at this time.  The pt is scheduled for a wound/device check on Monday and will keep this appointment.  The pt will also keep appointment with Tereso Newcomer PA-C on 01/03/13.

## 2012-12-18 ENCOUNTER — Ambulatory Visit (INDEPENDENT_AMBULATORY_CARE_PROVIDER_SITE_OTHER): Payer: PRIVATE HEALTH INSURANCE | Admitting: *Deleted

## 2012-12-18 DIAGNOSIS — I2589 Other forms of chronic ischemic heart disease: Secondary | ICD-10-CM

## 2012-12-18 DIAGNOSIS — I255 Ischemic cardiomyopathy: Secondary | ICD-10-CM

## 2012-12-18 LAB — ICD DEVICE OBSERVATION: DEV-0020ICD: NEGATIVE

## 2012-12-18 NOTE — Progress Notes (Signed)
Pt seen in device clinic for follow up of recently implanted subcutaneous ICD.  Wound well healed.  No redness, swelling, or edema.  Steri-strips removed today.   Device interrogated by industry and found to be functioning normally.  No changes made today. See PaceArt for full details.  Pt to follow up with Dr. Graciela Husbands 01-23-13 prior to moving to [facility].   Peggy Monk 12/18/2012 11:22 AM

## 2012-12-25 ENCOUNTER — Encounter (HOSPITAL_COMMUNITY)
Admission: RE | Admit: 2012-12-25 | Discharge: 2012-12-25 | Disposition: A | Discharge status: Home / Self Care | Payer: PRIVATE HEALTH INSURANCE | Source: Ambulatory Visit | Attending: Cardiovascular Disease | Admitting: Cardiovascular Disease

## 2012-12-25 NOTE — Progress Notes (Signed)
Pt returned to cardaic rehab today fol.lowing her recnet ICD implant. When applying electrodes, approximately 2 inch area of redness noted under left breast mid clavicular line.  Pt reports tenderness and yeast odor noted today after her shower. Incision line looks well healed, no redness, drainage swelling noted.  Phone call to Debra, Dr. Odessa Fleming nurse to inform of possible yeast under pt breast.  Per Stanton Kidney, pt advised appropriate to apply nystatin cream to affected area.  Pt also informed to keep area dry and if possible exposed to air.  Pt states she is planning to start wearing a sports bra to help elevate breast off the skin.  Pt advised to contact MD if s/s of infection at incision site occurs or yeast area symptoms unimproved or worsened. understanding verbalized

## 2012-12-27 ENCOUNTER — Encounter (HOSPITAL_COMMUNITY)
Admission: RE | Admit: 2012-12-27 | Discharge: 2012-12-27 | Disposition: A | Discharge status: Home / Self Care | Payer: PRIVATE HEALTH INSURANCE | Source: Ambulatory Visit | Attending: Cardiovascular Disease | Admitting: Cardiovascular Disease

## 2012-12-29 ENCOUNTER — Encounter (HOSPITAL_COMMUNITY)
Admission: RE | Admit: 2012-12-29 | Discharge: 2012-12-29 | Disposition: A | Discharge status: Home / Self Care | Payer: PRIVATE HEALTH INSURANCE | Source: Ambulatory Visit | Attending: Cardiovascular Disease | Admitting: Cardiovascular Disease

## 2013-01-01 ENCOUNTER — Encounter (HOSPITAL_COMMUNITY)
Admission: RE | Admit: 2013-01-01 | Discharge: 2013-01-01 | Disposition: A | Discharge status: Home / Self Care | Payer: PRIVATE HEALTH INSURANCE | Source: Ambulatory Visit | Attending: Cardiovascular Disease | Admitting: Cardiovascular Disease

## 2013-01-01 ENCOUNTER — Telehealth: Payer: Self-pay | Admitting: Cardiovascular Disease

## 2013-01-01 NOTE — Telephone Encounter (Signed)
Patient requesting a letter from Dr. Excell Seltzer that indicates that her Effient 10mg  is prescribed as a "non-maintenance/prophyylatic" medication and not a "maintenance" medication. Her insurance company approves/charges for this medication based on this factor. Routing to Dr. Excell Seltzer and Maceo Pro., RN, to address this specific request. Thank you!

## 2013-01-01 NOTE — Telephone Encounter (Signed)
New problem   Pt needs a letter for her insurance for effient-insurance changed med from maintenance to a non maintenance which makes it more expensive

## 2013-01-03 ENCOUNTER — Encounter (HOSPITAL_COMMUNITY)
Admission: RE | Admit: 2013-01-03 | Discharge: 2013-01-03 | Disposition: A | Discharge status: Home / Self Care | Payer: PRIVATE HEALTH INSURANCE | Source: Ambulatory Visit | Attending: Cardiovascular Disease | Admitting: Cardiovascular Disease

## 2013-01-03 ENCOUNTER — Ambulatory Visit: Payer: PRIVATE HEALTH INSURANCE | Admitting: Physician Assistant

## 2013-01-03 NOTE — Anesthesia Postprocedure Evaluation (Signed)
  Anesthesia Post-op Note  Patient: Wendy Reid  Procedure(s) Performed: Procedure(s): IMPLANTABLE CARDIOVERTER DEFIBRILLATOR IMPLANT (N/A)  Patient discharged prior to post op visit.  No apparent anesthetic complications

## 2013-01-05 ENCOUNTER — Encounter (HOSPITAL_COMMUNITY)
Admission: RE | Admit: 2013-01-05 | Discharge: 2013-01-05 | Disposition: A | Discharge status: Home / Self Care | Payer: PRIVATE HEALTH INSURANCE | Source: Ambulatory Visit | Attending: Cardiovascular Disease | Admitting: Cardiovascular Disease

## 2013-01-05 DIAGNOSIS — I252 Old myocardial infarction: Secondary | ICD-10-CM | POA: Insufficient documentation

## 2013-01-05 DIAGNOSIS — Z5189 Encounter for other specified aftercare: Secondary | ICD-10-CM | POA: Insufficient documentation

## 2013-01-05 DIAGNOSIS — I2510 Atherosclerotic heart disease of native coronary artery without angina pectoris: Secondary | ICD-10-CM | POA: Insufficient documentation

## 2013-01-05 DIAGNOSIS — E785 Hyperlipidemia, unspecified: Secondary | ICD-10-CM | POA: Insufficient documentation

## 2013-01-05 DIAGNOSIS — I259 Chronic ischemic heart disease, unspecified: Secondary | ICD-10-CM | POA: Insufficient documentation

## 2013-01-08 ENCOUNTER — Encounter (HOSPITAL_COMMUNITY)
Admission: RE | Admit: 2013-01-08 | Discharge: 2013-01-08 | Disposition: A | Discharge status: Home / Self Care | Payer: PRIVATE HEALTH INSURANCE | Source: Ambulatory Visit | Attending: Cardiovascular Disease | Admitting: Cardiovascular Disease

## 2013-01-10 ENCOUNTER — Encounter (HOSPITAL_COMMUNITY)
Admission: RE | Admit: 2013-01-10 | Discharge: 2013-01-10 | Disposition: A | Discharge status: Home / Self Care | Payer: PRIVATE HEALTH INSURANCE | Source: Ambulatory Visit | Attending: Cardiovascular Disease | Admitting: Cardiovascular Disease

## 2013-01-11 ENCOUNTER — Other Ambulatory Visit: Payer: Self-pay | Admitting: Cardiovascular Disease

## 2013-01-12 ENCOUNTER — Encounter (HOSPITAL_COMMUNITY)
Admission: RE | Admit: 2013-01-12 | Discharge: 2013-01-12 | Disposition: A | Discharge status: Home / Self Care | Payer: PRIVATE HEALTH INSURANCE | Source: Ambulatory Visit | Attending: Cardiovascular Disease | Admitting: Cardiovascular Disease

## 2013-01-12 ENCOUNTER — Encounter (HOSPITAL_COMMUNITY): Payer: Self-pay

## 2013-01-12 ENCOUNTER — Encounter: Payer: Self-pay | Admitting: Cardiovascular Disease

## 2013-01-12 NOTE — Progress Notes (Signed)
Pt graduated from cardiac rehab program today.  Medication list reconciled.  PHQ9 score- 0 .  Pt has made significant lifestyle changes and should be commended for her success however will need MD encouragement to continue exercise program.  Exercise is somewhat limited by joint pain.  Pt plans to continue exercise by walking and [employer].

## 2013-01-15 ENCOUNTER — Encounter (HOSPITAL_COMMUNITY): Payer: PRIVATE HEALTH INSURANCE

## 2013-01-17 ENCOUNTER — Ambulatory Visit (INDEPENDENT_AMBULATORY_CARE_PROVIDER_SITE_OTHER): Payer: PRIVATE HEALTH INSURANCE | Admitting: Cardiovascular Disease

## 2013-01-17 ENCOUNTER — Encounter: Payer: Self-pay | Admitting: *Deleted

## 2013-01-17 ENCOUNTER — Encounter (HOSPITAL_COMMUNITY): Payer: PRIVATE HEALTH INSURANCE

## 2013-01-17 ENCOUNTER — Encounter: Payer: Self-pay | Admitting: Cardiovascular Disease

## 2013-01-17 VITALS — BP 114/68 | HR 78 | Ht 61.5 in | Wt 217.0 lb

## 2013-01-17 DIAGNOSIS — I5022 Chronic systolic (congestive) heart failure: Secondary | ICD-10-CM

## 2013-01-17 MED ORDER — LOSARTAN POTASSIUM 50 MG PO TABS: 50.0000 mg | ORAL_TABLET | Freq: Every day | ORAL | Status: DC

## 2013-01-17 NOTE — Patient Instructions (Addendum)
Your physician has recommended you make the following change in your medication: INCREASE Losartan to 50mg  take one by mouth daily  Your physician recommends that you return for lab work on: 01/23/13 (BMP and CBC)  Please call the office for an appointment when you are going to be back in [place].

## 2013-01-17 NOTE — Progress Notes (Signed)
HPI:  Wendy Reid is a 62 year old woman presenting for followup evaluation. The patient is followed for coronary artery disease status post anterior wall MI, ischemic cardiomyopathy, and chronic systolic heart failure. She recently underwent subcutaneous ICD implantation. She tolerated this well. She feels well at the present time. She's had a little more trouble with acid reflux and would like to change her proton expected Prilosec as this was more effective.  She denies chest pain or pressure, palpitations, or shortness of breath. She denies leg swelling, orthopnea, or PND. She's been compliant with her medical program. She's had no lightheadedness or near syncope.  She plans to move to [facility] in the near future. She will still come back to [place] periodically to visit her son.  Outpatient Encounter Prescriptions as of 01/17/2013  Medication Sig Dispense Refill  . albuterol (PROVENTIL HFA;VENTOLIN HFA) 108 (90 BASE) MCG/ACT inhaler Inhale 2 puffs into the lungs every 4 (four) hours as needed for wheezing or shortness of breath.      Marland Kitchen albuterol (PROVENTIL) (2.5 MG/3ML) 0.083% nebulizer solution Take 2.5 mg by nebulization every 4 (four) hours as needed for wheezing or shortness of breath.      Marland Kitchen aspirin EC 81 MG tablet Take 81 mg by mouth daily.      Marland Kitchen atorvastatin (LIPITOR) 80 MG tablet Take 80 mg by mouth every evening.       . carvedilol (COREG) 12.5 MG tablet Take 12.5 mg by mouth 2 (two) times daily.      . chlorpheniramine-HYDROcodone (TUSSIONEX) 10-8 MG/5ML LQCR Take 5 mLs by mouth every 12 (twelve) hours as needed. For cough      . fexofenadine (ALLEGRA) 180 MG tablet Take 180 mg by mouth daily as needed (for allergies). For allergies      . fluticasone (FLONASE) 50 MCG/ACT nasal spray Place 2 sprays into the nose daily as needed for allergies. For allergies      . furosemide (LASIX) 40 MG tablet TAKE 1 TABLET (40 MG TOTAL) BY MOUTH DAILY.  30 tablet  3  . losartan (COZAAR)  25 MG tablet Take 1 tablet (25 mg total) by mouth daily.  30 tablet  11  . nitroGLYCERIN (NITROSTAT) 0.4 MG SL tablet Place 0.4 mg under the tongue every 5 (five) minutes as needed for chest pain. x3 doses as needed for chest pain      . oxyCODONE (OXY IR/ROXICODONE) 5 MG immediate release tablet Take 5 mg by mouth every 6 (six) hours as needed for pain. For pain      . pantoprazole (PROTONIX) 40 MG tablet Take 40 mg by mouth 2 (two) times daily.       . prasugrel (EFFIENT) 10 MG TABS Take 10 mg by mouth daily.      Marland Kitchen spironolactone (ALDACTONE) 25 MG tablet Take 0.5 tablets (12.5 mg total) by mouth daily.  30 tablet  6   No facility-administered encounter medications on file as of 01/17/2013.    Allergies  Allergen Reactions  . Dilaudid [Hydromorphone Hcl] Nausea And Vomiting  . Vicodin [Hydrocodone-Acetaminophen] Nausea And Vomiting  . Zofran Nausea And Vomiting    Past Medical History  Diagnosis Date  . Asthma   . GERD (gastroesophageal reflux disease)   . Hypertension   . 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 Barrett's esophagus with esophagitis 11/01/2011    Patient states no evidence of barrett's from last surveillance EGD on 11/2010  .  Stress bladder incontinence, female 11/01/2011  . 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 Complex regional pain syndrome of lower limb   . Coronary artery disease     STEMI 07/2012 Cath totally occluded prox LAD s/p DES, otherwise nonobstructive dz in RCA and LCx, EF 30%  . Systolic CHF     a. Echo 07/22/12 EF 30-35%, grade 1 diastolic dysfunction, akinesis of the mid-distalanterior and apical myocardium b. Echo 09/01/12: EF 25%, multiple WMAs suggesting LAD infarction, normal RV size and systolic function and a small circumferential pericardial effusion;  c. Echo 5/14:  EF 25%, mid and dist ant, septum apex and inf apex AK, mild MR, small effusion. b. s/p SQ ICD 12/07/12.  . Cardiac arrest     V.Fib arrest 07/2012 in the setting of  acute anterior STEMI; Discharged with [facility]. s/p SQ ICD 12/07/12  . Hyperlipidemia   . Environmental allergies     Uses albuterol due to wheezing.  . Ischemic cardiomyopathy   . Family history of anesthesia complication     DIFFICULT FOR SON TO WAKE UP "  . Myocardial infarction 07/21/2012    ROS: Negative except as per HPI  BP 114/68  Pulse 78  Ht 5' 1.5" (1.562 m)  Wt 98.431 kg (217 lb)  BMI 40.34 kg/m2  SpO2 94%  PHYSICAL EXAM: Pt is alert and oriented, NAD HEENT: normal Neck: JVP - normal, carotids 2+= without bruits Lungs: CTA bilaterally CV: RRR without murmur or gallop Abd: soft, NT, Positive BS, no hepatomegaly. Subcutaneous ICD implantation site over the left lateral abdominal area is well healed Ext: no C/C/E, distal pulses intact and equal Skin: warm/dry no rash  ASSESSMENT AND PLAN: 1. Chronic systolic heart failure, New York Heart Association class II. The patient is tolerating her medical program well. She's had improvement in her symptoms. I recommended that we increase her losartan to 50 mg daily. If she tolerates this well would consider increase of carvedilol to 25 mg twice daily when she sees Dr. Graciela Husbands next week. The remainder of her medical therapy was reviewed and is appropriate.  2. Coronary artery disease, native vessel. The patient is stable without anginal symptoms. She will remain on aspirin and effient for one year after PCI. Her stop date for effient will be 07/21/2013.  3. Hyperlipidemia. The patient is on atorvastatin 80 mg. Last lipid panel 08/16/2012 showed a cholesterol of 124, HDL 48, LDL 55, and triglycerides 562.  Disposition: She follows up with Dr. Graciela Husbands next week. Would consider increase of her carvedilol at that time if she has tolerated the increase in losartan. She will call back in the future for followup if she would like to be seen when she returns to [place].  Tonny Bollman 01/19/2013 6:19 AM

## 2013-01-19 ENCOUNTER — Encounter: Payer: Self-pay | Admitting: Cardiovascular Disease

## 2013-01-19 ENCOUNTER — Encounter (HOSPITAL_COMMUNITY): Payer: PRIVATE HEALTH INSURANCE

## 2013-01-22 ENCOUNTER — Encounter (HOSPITAL_COMMUNITY): Payer: PRIVATE HEALTH INSURANCE

## 2013-01-23 ENCOUNTER — Other Ambulatory Visit (INDEPENDENT_AMBULATORY_CARE_PROVIDER_SITE_OTHER): Payer: PRIVATE HEALTH INSURANCE

## 2013-01-23 ENCOUNTER — Ambulatory Visit (INDEPENDENT_AMBULATORY_CARE_PROVIDER_SITE_OTHER): Payer: PRIVATE HEALTH INSURANCE | Admitting: Internal Medicine

## 2013-01-23 ENCOUNTER — Encounter: Payer: Self-pay | Admitting: Internal Medicine

## 2013-01-23 VITALS — BP 105/73 | HR 81 | Ht 61.5 in | Wt 219.0 lb

## 2013-01-23 DIAGNOSIS — I5022 Chronic systolic (congestive) heart failure: Secondary | ICD-10-CM

## 2013-01-23 DIAGNOSIS — I255 Ischemic cardiomyopathy: Secondary | ICD-10-CM

## 2013-01-23 DIAGNOSIS — I2589 Other forms of chronic ischemic heart disease: Secondary | ICD-10-CM

## 2013-01-23 DIAGNOSIS — Z95810 Presence of automatic (implantable) cardiac defibrillator: Secondary | ICD-10-CM

## 2013-01-23 LAB — BASIC METABOLIC PANEL
BUN: 12 mg/dL (ref 6–23)
Chloride: 105 mEq/L (ref 96–112)
GFR: 79.88 mL/min (ref >60.00)
Potassium: 3.8 mEq/L (ref 3.5–5.1)
Sodium: 139 mEq/L (ref 135–145)

## 2013-01-23 LAB — CBC WITH DIFFERENTIAL/PLATELET
Basophils Relative: 0.3 % (ref 0.0–3.0)
Eosinophils Relative: 1.5 % (ref 0.0–5.0)
HCT: 30.5 % — ABNORMAL LOW (ref 36.0–46.0)
Lymphs Abs: 1.5 10*3/uL (ref 0.7–4.0)
MCV: 86.3 fl (ref 78.0–100.0)
Monocytes Relative: 5 % (ref 3.0–12.0)
Platelets: 259 10*3/uL (ref 150.0–400.0)
RBC: 3.54 Mil/uL — ABNORMAL LOW (ref 3.87–5.11)
WBC: 5.7 10*3/uL (ref 4.5–10.5)

## 2013-01-23 LAB — ICD DEVICE OBSERVATION: DEVICE MODEL ICD: 11487

## 2013-01-23 MED ORDER — OMEPRAZOLE 40 MG PO CPDR: 40.0000 mg | DELAYED_RELEASE_CAPSULE | Freq: Two times a day (BID) | ORAL | Status: DC

## 2013-01-23 MED ORDER — CARVEDILOL 25 MG PO TABS: 25.0000 mg | ORAL_TABLET | Freq: Two times a day (BID) | ORAL | Status: AC

## 2013-01-23 NOTE — Progress Notes (Signed)
Patient Care Team: Rosario Adie as PCP - General (Family Medicine)   HPI  Wendy Reid is a 62 y.o. female Seen following S. ICD implantation for primary prevention of ischemic heart disease in the context of an anterior wall MI February 2014 with residual left ventricular dysfunction. Echo May 2014 EF 25%.  She is overall doing well , also of late she has noted some discomfort at her defibrillator site motion. There is no tenderness. She is also increasingly aware of all of the device  Otherwise she denies chest pain or edema; mild shortness of breath with effort. She's having some pain in her footthis  Past Medical History  Diagnosis Date  . Asthma   . GERD (gastroesophageal reflux disease)   . Hypertension   . 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 Barrett's esophagus with esophagitis 11/01/2011    Patient states no evidence of barrett's from last surveillance EGD on 11/2010  . Stress bladder incontinence, female 11/01/2011  . 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 Complex regional pain syndrome of lower limb   . Coronary artery disease     STEMI 07/2012 Cath totally occluded prox LAD s/p DES, otherwise nonobstructive dz in RCA and LCx, EF 30%  . Systolic CHF     a. Echo 07/22/12 EF 30-35%, grade 1 diastolic dysfunction, akinesis of the mid-distalanterior and apical myocardium b. Echo 09/01/12: EF 25%, multiple WMAs suggesting LAD infarction, normal RV size and systolic function and a small circumferential pericardial effusion;  c. Echo 5/14:  EF 25%, mid and dist ant, septum apex and inf apex AK, mild MR, small effusion. b. s/p SQ ICD 12/07/12.  . Cardiac arrest     V.Fib arrest 07/2012 in the setting of acute anterior STEMI; Discharged with [employer]. s/p SQ ICD 12/07/12  . Hyperlipidemia   . Environmental allergies     Uses albuterol due to wheezing.  . Ischemic cardiomyopathy   . Family history of anesthesia complication     DIFFICULT FOR SON TO WAKE UP "  .  Myocardial infarction 07/21/2012    Past Surgical History  Procedure Laterality Date  . Cholecystectomy  nov 2011  . Appendectomy  as teenager  . Tonsillectomy  as teenager  . Abdominal hysterectomy  1999    complete  . Both knees arthroscopic sugery last done 2007    . Total knee arthroplasty  10/29/2011    Procedure: TOTAL KNEE ARTHROPLASTY;  Surgeon: Eugenia Mcalpine, MD;  Location: [facility];  Service: Orthopedics;  Laterality: Right;  . Coronary angioplasty with stent placement Left 07/21/2012    LAD x 1     Current Outpatient Prescriptions  Medication Sig Dispense Refill  . albuterol (PROVENTIL HFA;VENTOLIN HFA) 108 (90 BASE) MCG/ACT inhaler Inhale 2 puffs into the lungs every 4 (four) hours as needed for wheezing or shortness of breath.      Marland Kitchen albuterol (PROVENTIL) (2.5 MG/3ML) 0.083% nebulizer solution Take 2.5 mg by nebulization every 4 (four) hours as needed for wheezing or shortness of breath.      Marland Kitchen aspirin EC 81 MG tablet Take 81 mg by mouth daily.      Marland Kitchen atorvastatin (LIPITOR) 80 MG tablet Take 80 mg by mouth every evening.       . carvedilol (COREG) 12.5 MG tablet Take 12.5 mg by mouth 2 (two) times daily.      . chlorpheniramine-HYDROcodone (TUSSIONEX) 10-8 MG/5ML LQCR Take 5 mLs by mouth  every 12 (twelve) hours as needed. For cough      . fexofenadine (ALLEGRA) 180 MG tablet Take 180 mg by mouth daily as needed (for allergies). For allergies      . fluticasone (FLONASE) 50 MCG/ACT nasal spray Place 2 sprays into the nose daily as needed for allergies. For allergies      . furosemide (LASIX) 40 MG tablet TAKE 1 TABLET (40 MG TOTAL) BY MOUTH DAILY.  30 tablet  3  . losartan (COZAAR) 50 MG tablet Take 1 tablet (50 mg total) by mouth daily.  30 tablet  11  . nitroGLYCERIN (NITROSTAT) 0.4 MG SL tablet Place 0.4 mg under the tongue every 5 (five) minutes as needed for chest pain. x3 doses as needed for chest pain      . oxyCODONE (OXY IR/ROXICODONE) 5 MG immediate release tablet  Take 5 mg by mouth every 6 (six) hours as needed for pain. For pain      . pantoprazole (PROTONIX) 40 MG tablet Take 40 mg by mouth 2 (two) times daily.       . prasugrel (EFFIENT) 10 MG TABS Take 10 mg by mouth daily.      Marland Kitchen spironolactone (ALDACTONE) 25 MG tablet Take 0.5 tablets (12.5 mg total) by mouth daily.  30 tablet  6   No current facility-administered medications for this visit.    Allergies  Allergen Reactions  . Dilaudid [Hydromorphone Hcl] Nausea And Vomiting  . Vicodin [Hydrocodone-Acetaminophen] Nausea And Vomiting  . Zofran Nausea And Vomiting    Review of Systems negative except from HPI and PMH  Physical Exam BP 105/73  Pulse 81  Ht 5' 1.5" (1.562 m)  Wt 219 lb (99.338 kg)  BMI 40.71 kg/m2 Well developed and well nourished in no acute distress HENT normal E scleral and icterus clear Neck Supple JVP flat; carotids brisk and full Clear to ausculation Device pocket well healed; without hematoma or erythema.  There is no tethering *Regular rate and rhythm, no murmurs gallops or rub Soft with active bowel sounds No clubbing cyanosis   none Edema Alert and oriented, grossly normal motor and sensory function Skin Warm and Dry  ECG demonstrates sinus rhythm 81 Intervals 15/10/37 anterior wall infarct Inferior wall infarct   Assessment and  Plan

## 2013-01-23 NOTE — Patient Instructions (Signed)
Your physician has recommended you make the following change in your medication:  1) Increase coreg (carvedilol) to 25 mg one tablet by mouth twice daily.  Your physician recommends that you schedule a follow-up appointment in: 3 months with Dr. Graciela Husbands.

## 2013-01-23 NOTE — Assessment & Plan Note (Addendum)
On guideline directed medical therapy. She has tolerated up titration of her losartan. We'll further uptitrate her carvedilol 25--50  We also spent >50% of our visit on counseling re view of deATH assoc catastrphic events and placing of an ICD

## 2013-01-23 NOTE — Assessment & Plan Note (Signed)
The patient's device was interrogated.  The information was reviewed. No changes were made in the programming.    

## 2013-01-24 ENCOUNTER — Encounter (HOSPITAL_COMMUNITY): Payer: PRIVATE HEALTH INSURANCE

## 2013-01-26 ENCOUNTER — Encounter (HOSPITAL_COMMUNITY): Payer: PRIVATE HEALTH INSURANCE

## 2013-01-26 ENCOUNTER — Ambulatory Visit: Payer: PRIVATE HEALTH INSURANCE | Admitting: Cardiovascular Disease

## 2013-01-30 ENCOUNTER — Encounter: Payer: Self-pay | Admitting: Cardiovascular Disease

## 2013-01-30 NOTE — Telephone Encounter (Signed)
Letter is done. It is left at the front desk for her to pick up. I left a message on her voicemail this evening notifying her that the latter is complete and ready for her to pick up.

## 2013-01-31 ENCOUNTER — Other Ambulatory Visit: Payer: Self-pay

## 2013-01-31 DIAGNOSIS — Z1231 Encounter for screening mammogram for malignant neoplasm of breast: Secondary | ICD-10-CM

## 2013-02-01 ENCOUNTER — Ambulatory Visit: Payer: PRIVATE HEALTH INSURANCE

## 2013-02-01 ENCOUNTER — Telehealth: Payer: Self-pay | Admitting: Cardiovascular Disease

## 2013-02-01 ENCOUNTER — Telehealth: Payer: Self-pay | Admitting: *Deleted

## 2013-02-01 NOTE — Telephone Encounter (Signed)
Called in authorization approval for Omeprazole. Approval through 07/31/12

## 2013-02-01 NOTE — Telephone Encounter (Signed)
Pt would like to know about letter for her insurance co, pls call

## 2013-02-01 NOTE — Telephone Encounter (Signed)
Wendy Reid has already spoken with this patient and made her aware that letter was placed at the front desk on 01/30/13.

## 2013-02-14 ENCOUNTER — Encounter: Payer: Self-pay | Admitting: Cardiovascular Disease

## 2013-02-15 ENCOUNTER — Telehealth: Payer: Self-pay | Admitting: Internal Medicine

## 2013-02-15 NOTE — Telephone Encounter (Signed)
Leaving to move to [facility] on 02/26/13. Dr. Graciela Husbands was to do a introduction letter to Dr. Einar Gip. Will pick up the letter next week.

## 2013-02-20 ENCOUNTER — Encounter: Payer: Self-pay | Admitting: *Deleted

## 2013-02-20 NOTE — Telephone Encounter (Signed)
Letter up front for pick-up. Left message to inform patient

## 2013-02-21 ENCOUNTER — Encounter: Payer: Self-pay | Admitting: Internal Medicine

## 2013-02-28 ENCOUNTER — Other Ambulatory Visit: Payer: Self-pay | Admitting: Nurse Practitioner

## 2013-04-30 ENCOUNTER — Encounter: Payer: PRIVATE HEALTH INSURANCE | Admitting: Internal Medicine

## 2013-05-02 ENCOUNTER — Ambulatory Visit: Payer: PRIVATE HEALTH INSURANCE | Admitting: Cardiovascular Disease

## 2013-06-04 ENCOUNTER — Telehealth: Payer: Self-pay | Admitting: Cardiovascular Disease

## 2013-06-04 NOTE — Telephone Encounter (Signed)
New message  Wendy Reid with [employer] (the patients health insurance company called) States that the medications prescribed are maintenance medications after 2-3 refills// Requests a call back to discuss how to move forward.. Please assist

## 2013-06-04 NOTE — Telephone Encounter (Signed)
I reviewed the pt's chart and looked up her insurance card and this is her carrier. I spoke with [facility] and she just called the office to make [place] aware that she had mailed the pt a letter to make her aware that Effient in considered a maintenance medication if it is refilled more than one time. Dr Excell Seltzer had previously written a letter in August to say that this is a non-maintenance medication and that it would need to be taken for one year. Per Jenean Lindau Effient is a maintenance medication because it has been filled more than one time and the pt needs to take it for a year. The insurance company just wanted to make [place] aware of the ruling.

## 2013-06-05 ENCOUNTER — Encounter: Payer: Self-pay | Admitting: Cardiovascular Disease

## 2013-06-05 ENCOUNTER — Encounter: Payer: Self-pay | Admitting: Cardiology

## 2013-06-19 ENCOUNTER — Other Ambulatory Visit: Payer: Self-pay | Admitting: Cardiovascular Disease

## 2013-06-25 ENCOUNTER — Other Ambulatory Visit: Payer: Self-pay

## 2013-06-25 MED ORDER — FUROSEMIDE 40 MG PO TABS: ORAL_TABLET | ORAL | Status: DC

## 2013-06-29 ENCOUNTER — Other Ambulatory Visit: Payer: Self-pay

## 2013-06-29 MED ORDER — ATORVASTATIN CALCIUM 80 MG PO TABS: 80.0000 mg | ORAL_TABLET | Freq: Every evening | ORAL | Status: DC

## 2013-08-24 ENCOUNTER — Telehealth: Payer: Self-pay | Admitting: Internal Medicine

## 2013-08-24 NOTE — Telephone Encounter (Signed)
lmtcb

## 2013-08-24 NOTE — Telephone Encounter (Signed)
New message     Pt want to talk to a nurse regarding her effient

## 2013-08-30 NOTE — Telephone Encounter (Signed)
Called pt, she tells me it was a mistake on her part. Everything is fine, she got confused on some things, but is fine now. She thanked [place] for calling back, and apologized for bothering [place]. I explained it was no bother and glad all is ok.

## 2013-09-10 ENCOUNTER — Encounter: Payer: Self-pay | Admitting: *Deleted

## 2013-09-29 ENCOUNTER — Other Ambulatory Visit: Payer: Self-pay | Admitting: Cardiovascular Disease

## 2013-10-31 ENCOUNTER — Other Ambulatory Visit: Payer: Self-pay | Admitting: Cardiovascular Disease

## 2013-11-10 ENCOUNTER — Other Ambulatory Visit: Payer: Self-pay | Admitting: Cardiovascular Disease

## 2013-12-12 ENCOUNTER — Other Ambulatory Visit: Payer: Self-pay | Admitting: Cardiovascular Disease

## 2013-12-18 ENCOUNTER — Telehealth: Payer: Self-pay | Admitting: Internal Medicine

## 2013-12-18 NOTE — Telephone Encounter (Signed)
12-18-13 lmm @ 419pm to have pt call to rs cxl defib ck with klein from 04-2013/mt

## 2014-01-11 ENCOUNTER — Other Ambulatory Visit: Payer: Self-pay | Admitting: Cardiovascular Disease

## 2014-01-14 ENCOUNTER — Other Ambulatory Visit: Payer: Self-pay | Admitting: Cardiovascular Disease

## 2014-01-27 ENCOUNTER — Other Ambulatory Visit: Payer: Self-pay | Admitting: Internal Medicine

## 2014-01-27 ENCOUNTER — Other Ambulatory Visit: Payer: Self-pay | Admitting: Cardiovascular Disease

## 2014-04-21 ENCOUNTER — Other Ambulatory Visit: Payer: Self-pay | Admitting: Cardiovascular Disease

## 2014-05-16 ENCOUNTER — Encounter (HOSPITAL_COMMUNITY): Payer: Self-pay | Admitting: Cardiovascular Disease

## 2014-05-29 ENCOUNTER — Encounter: Payer: Self-pay | Admitting: *Deleted

## 2014-06-20 ENCOUNTER — Encounter (HOSPITAL_COMMUNITY): Payer: Self-pay | Admitting: Internal Medicine

## 2014-07-27 ENCOUNTER — Other Ambulatory Visit: Payer: Self-pay | Admitting: Internal Medicine

## 2014-08-07 ENCOUNTER — Other Ambulatory Visit: Payer: Self-pay | Admitting: Cardiovascular Disease

## 2014-08-14 IMAGING — CR DG CHEST 1V PORT
1 series · 1 of 1 positions shown · non-contrast
Comparison: Yesterday

CLINICAL DATA: Endotracheal tube.  Chest congestion.  Myocardial
infarction.

PORTABLE CHEST - 1 VIEW

[AP]
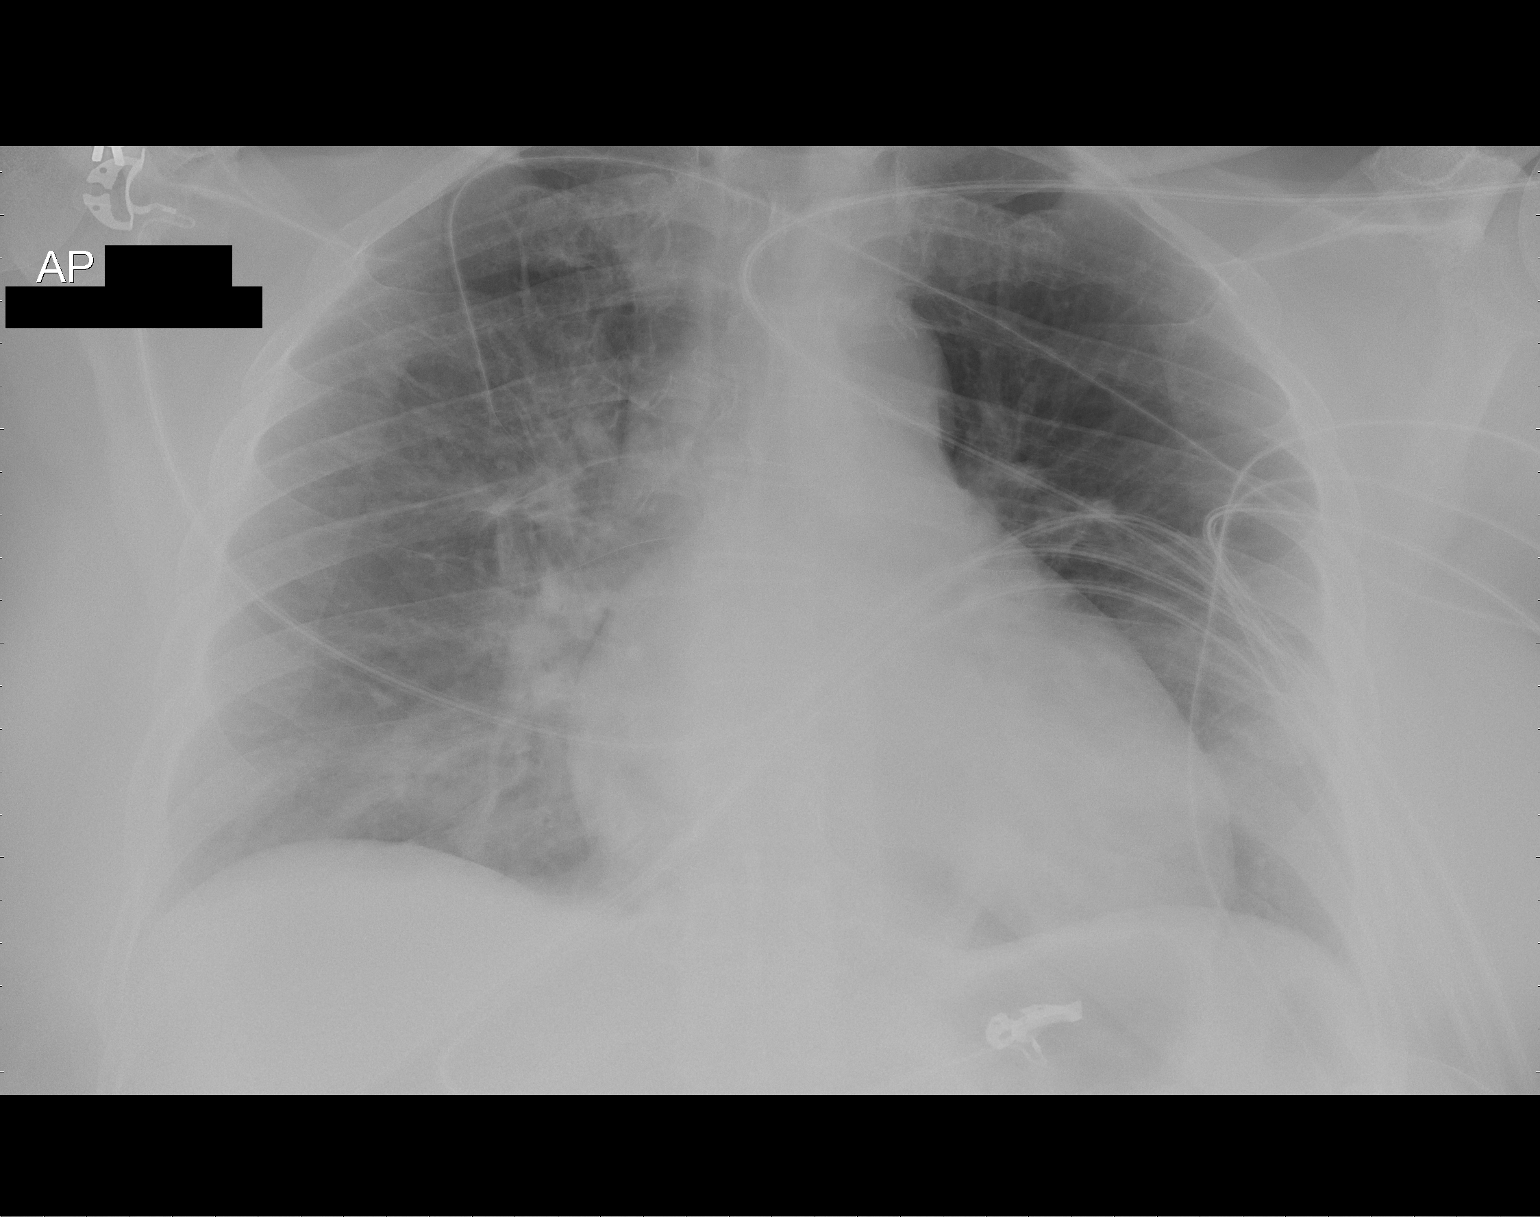

[1 of 1 positions shown; findings below may reference images not displayed]

FINDINGS: Endotracheal and NG tubes removed.  Lungs under aerated
with stable basilar atelectasis.  No pneumothorax.  No edema.
Normal heart size.
IMPRESSION: Extubated.  Stable atelectasis.

## 2014-08-16 IMAGING — CR DG CHEST 1V PORT
1 series · 1 of 1 positions shown · non-contrast
Comparison: 07/22/2012

CLINICAL DATA: Cough

PORTABLE CHEST - 1 VIEW

[AP]
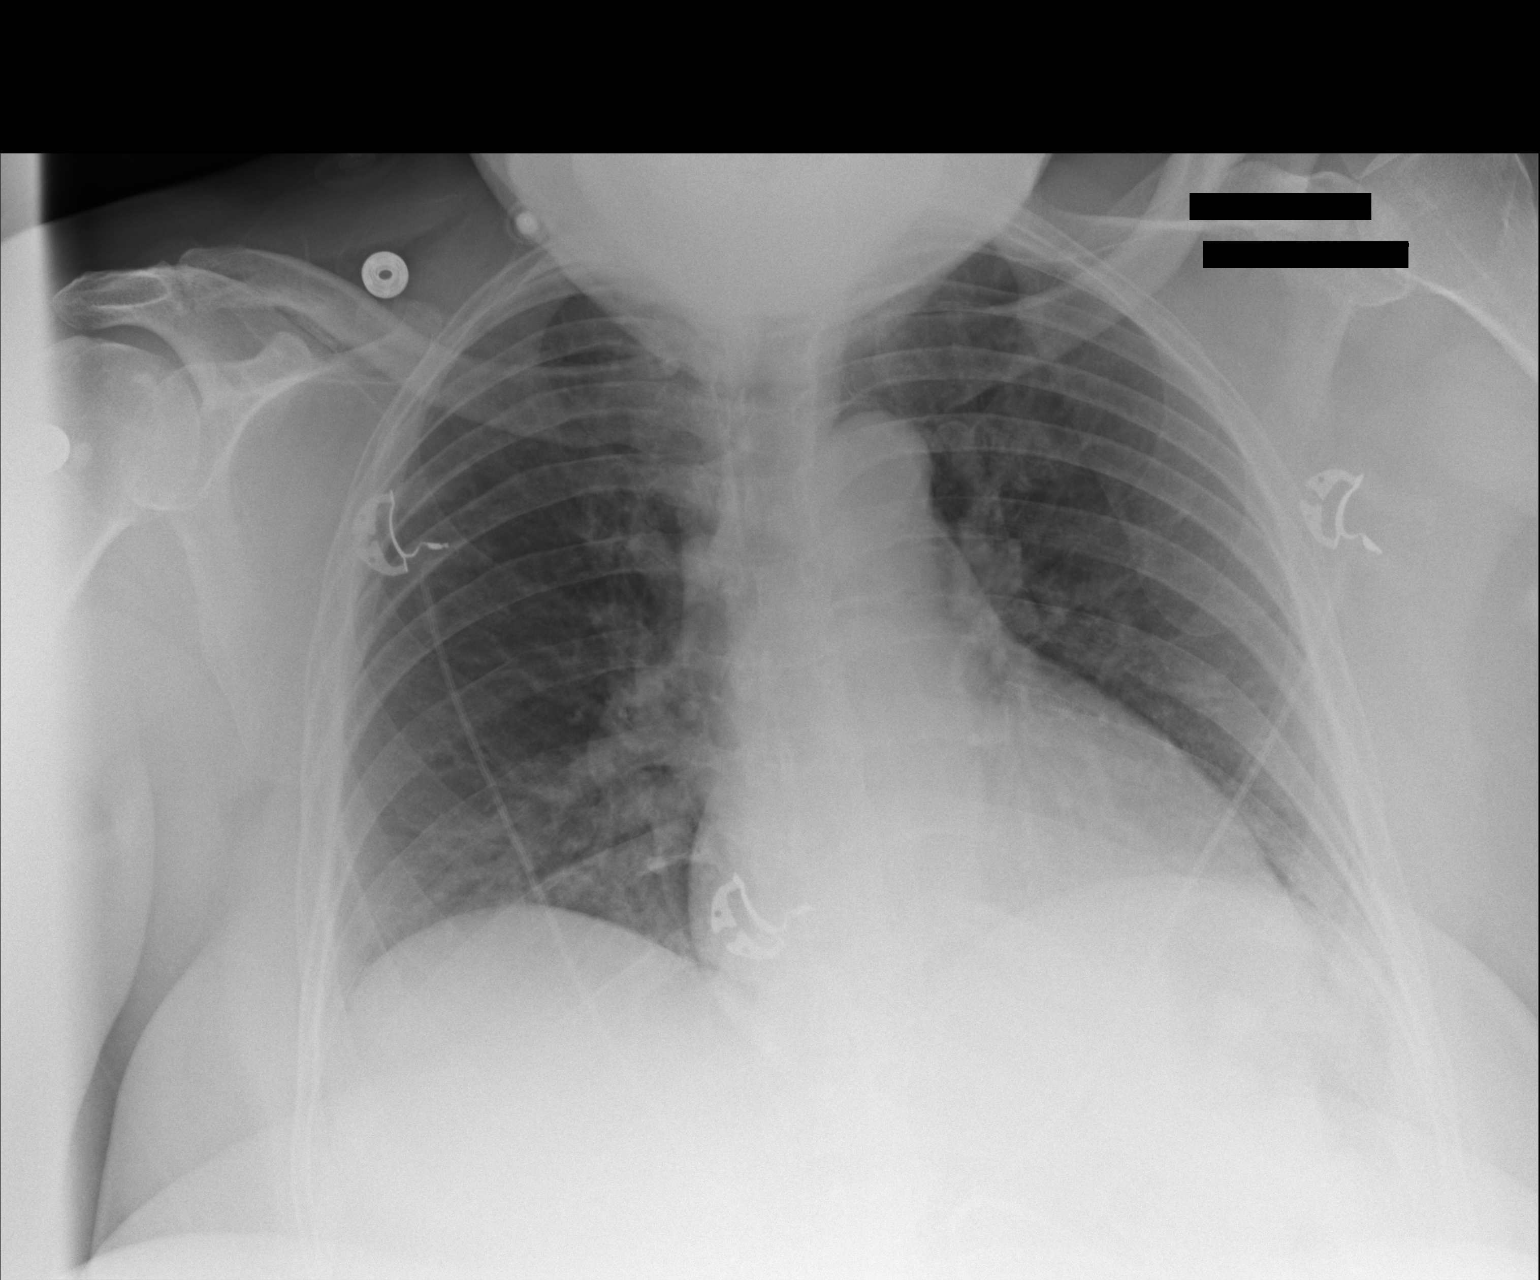

[1 of 1 positions shown; findings below may reference images not displayed]

FINDINGS: Normal heart size.  No pleural effusion or edema.  Lung
volumes are low.  No airspace consolidation identified.
IMPRESSION: 1.  No acute cardiopulmonary abnormalities.

## 2014-08-23 IMAGING — CR DG CHEST 1V PORT
1 series · 1 of 1 positions shown · non-contrast
Comparison: 07/24/2012

CLINICAL DATA: Chest pain tonight.

PORTABLE CHEST - 1 VIEW

[AP]
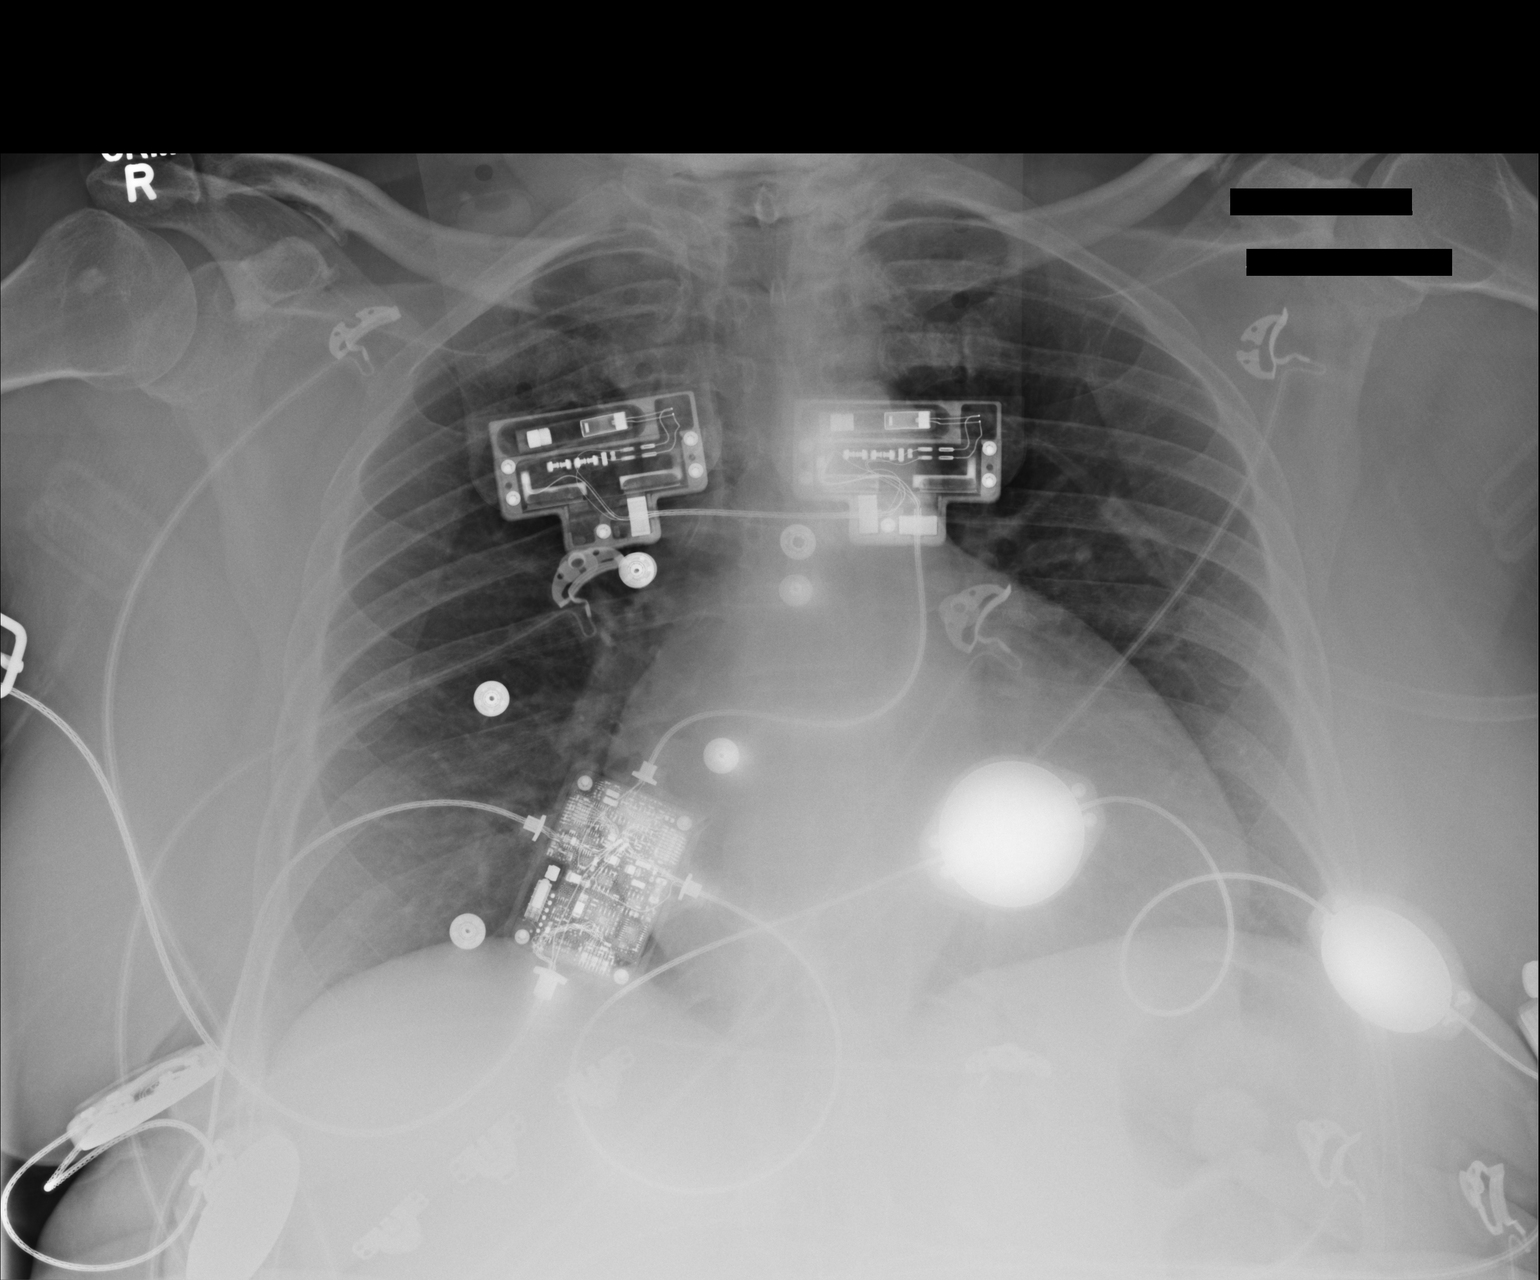

[1 of 1 positions shown; findings below may reference images not displayed]

FINDINGS: External defibrillator projected over the mid chest.
Shallow inspiration.  Heart size and pulmonary vascularity are
normal for technique.  No focal airspace disease or consolidation
in the lungs.  No blunting of costophrenic angles.  No
pneumothorax.  No significant change since previous study.
IMPRESSION: No evidence of active pulmonary disease.

## 2014-08-25 ENCOUNTER — Other Ambulatory Visit: Payer: Self-pay | Admitting: Internal Medicine

## 2014-08-25 ENCOUNTER — Other Ambulatory Visit: Payer: Self-pay | Admitting: Cardiovascular Disease

## 2014-08-26 NOTE — Telephone Encounter (Signed)
This pt was last seen by Dr Burt Knack in August 2014.  The pt has relocated to [facility] since that appointment.  The pt should be having medications filled by MD in her area.  Will deny medication at this time.

## 2014-08-26 NOTE — Telephone Encounter (Signed)
Please advise on refill. Patient is overdue for an appointment. Thanks, MI

## 2014-08-27 NOTE — Telephone Encounter (Signed)
Patient should contact ordering prescriber or PCP for refills.  She has not been seen since 2014, by Dr. Caryl Comes.  And [place] of [place].

## 2014-09-08 ENCOUNTER — Other Ambulatory Visit: Payer: Self-pay | Admitting: Cardiovascular Disease

## 2014-09-23 IMAGING — CT CT ANGIO CHEST
2 of 6 series · 18 of 46 positions shown · IV contrast (APPLIED)
Comparison: Multiple exams, including 08/31/2012 and 11/03/2011

CLINICAL DATA: tachycardia, desaturation. Shortness of breath.
Chest pain.

CT ANGIOGRAPHY CHEST
TECHNIQUE: Multidetector CT imaging of the chest using the
standard protocol during bolus administration of intravenous
contrast. Multiplanar reconstructed images including MIPs were
obtained and reviewed to evaluate the vascular anatomy.
Contrast: 100mL OMNIPAQUE IOHEXOL 350 MG/ML SOLN

[Series 6: pulm embolism 1.0 b25f thin · axial · 0.66mm/px · z∈[-260,-62]mm · 15 of 218 slices shown]
[im 10/218  lung]
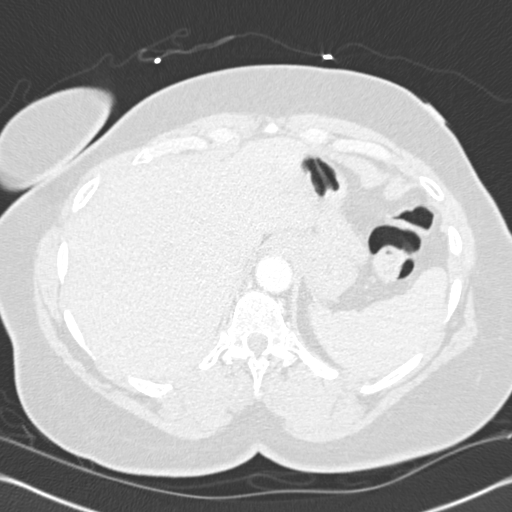
[im 29/218  soft-tissue]
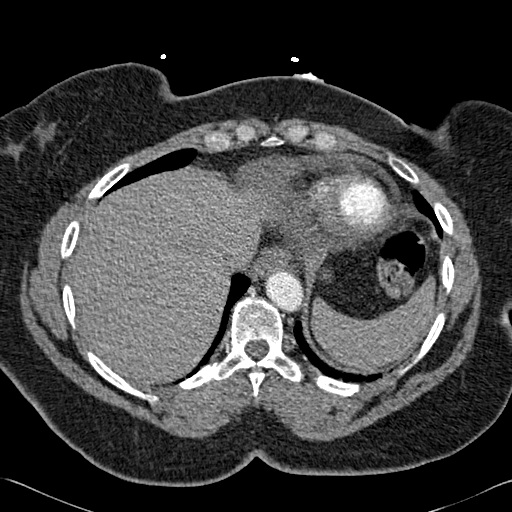
[im 38/218  lung]
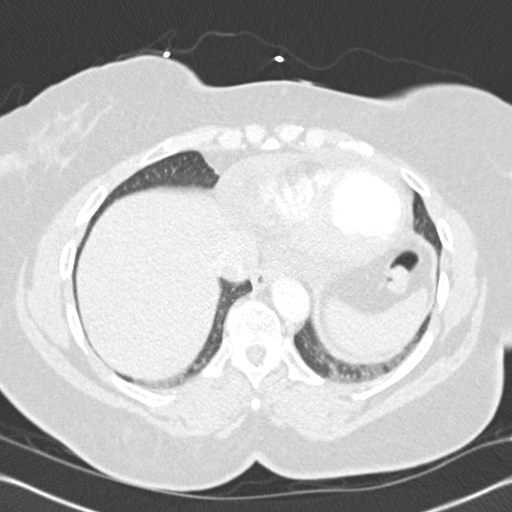
[im 57/218  soft-tissue]
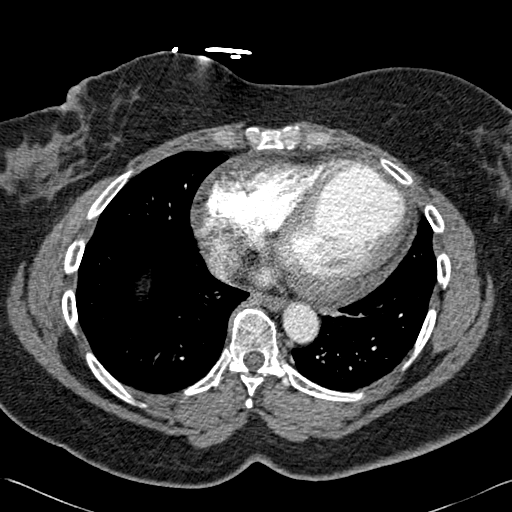
[im 67/218  lung]
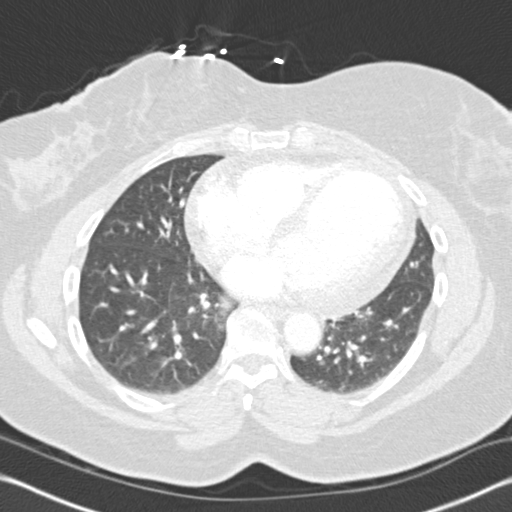
[im 85/218  soft-tissue]
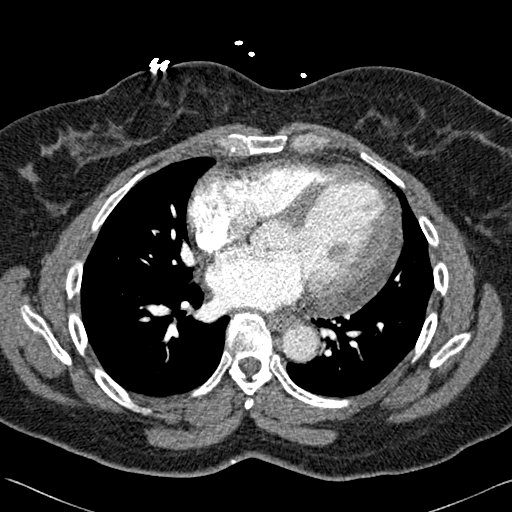
[im 95/218  lung]
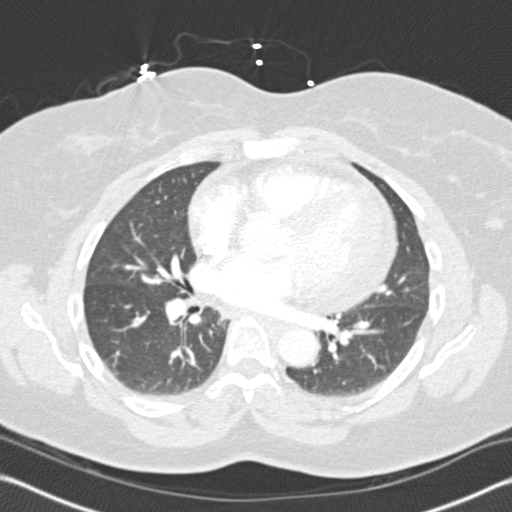
[im 114/218  soft-tissue]
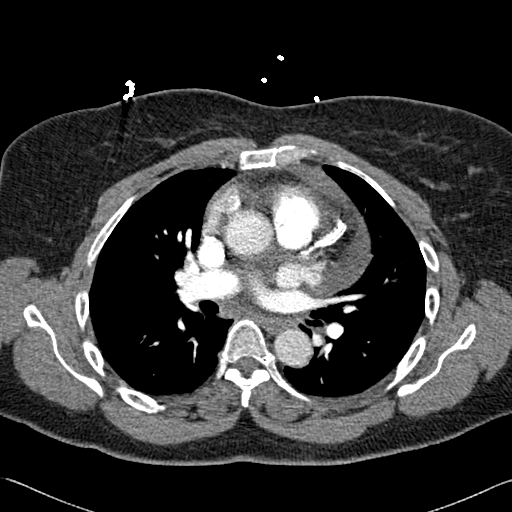
[im 123/218  lung]
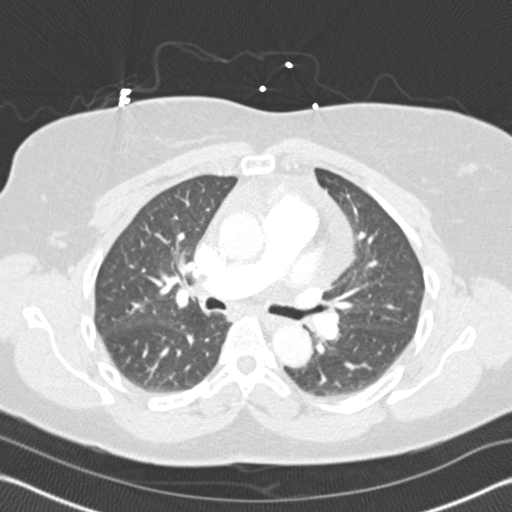
[im 133/218  soft-tissue]
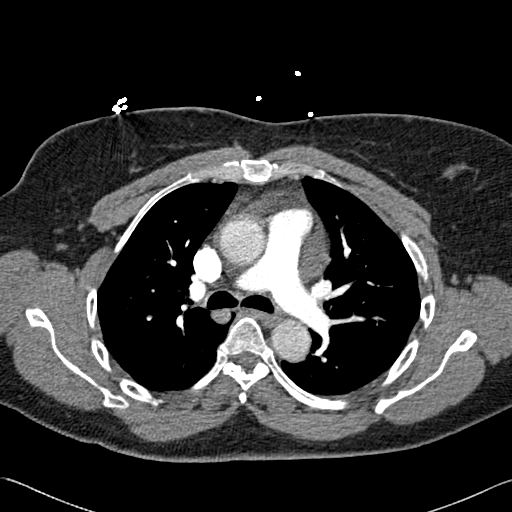
[im 151/218  lung]
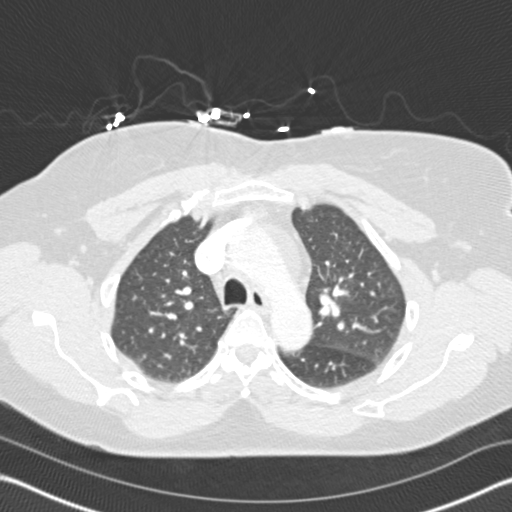
[im 161/218  soft-tissue]
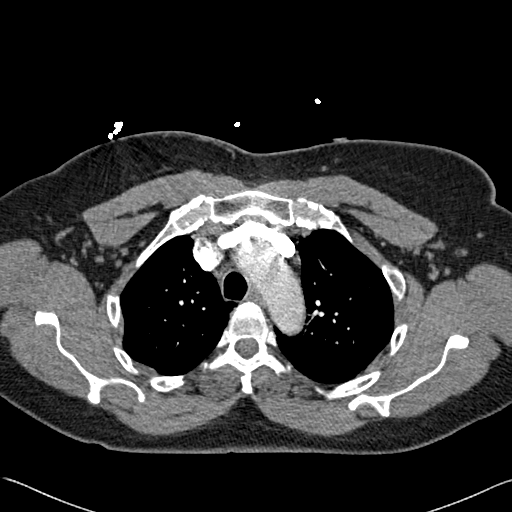
[im 180/218  lung]
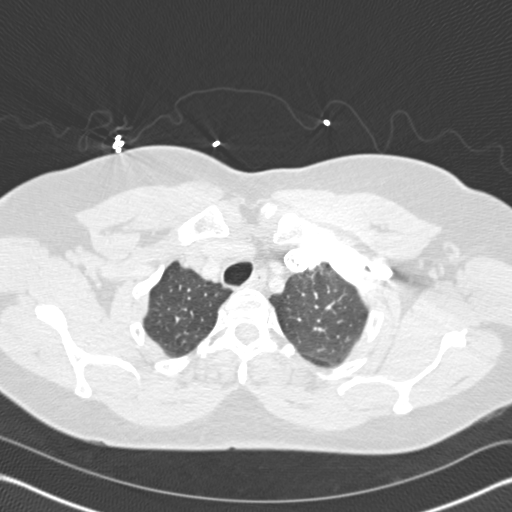
[im 189/218  soft-tissue]
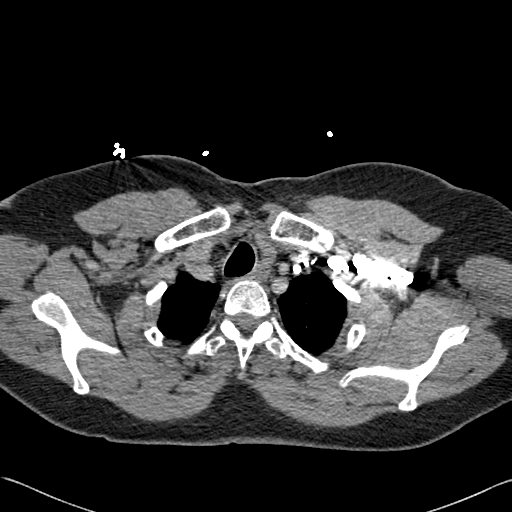
[im 208/218  lung]
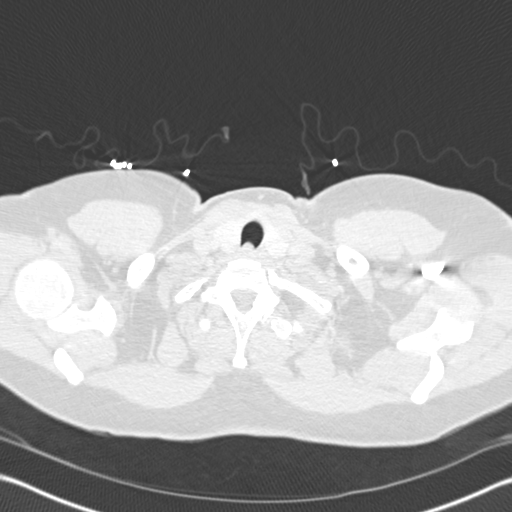

[Series 602: cor · coronal · 0.66mm/px · 3 of 96 slices shown]
[im 24/96  soft-tissue]
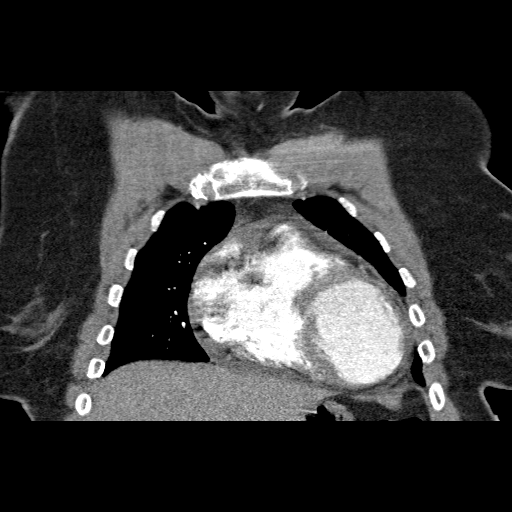
[im 48/96  soft-tissue]
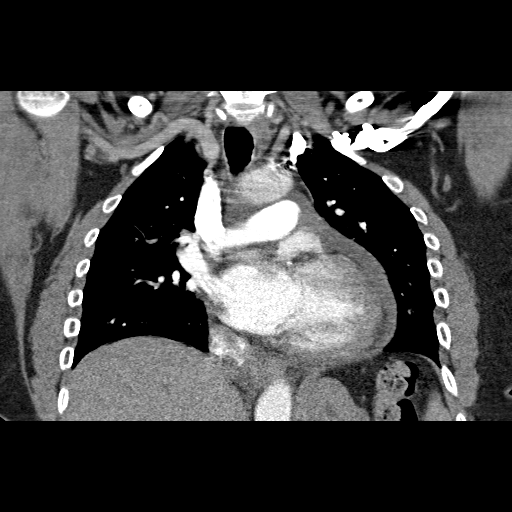
[im 72/96  soft-tissue]
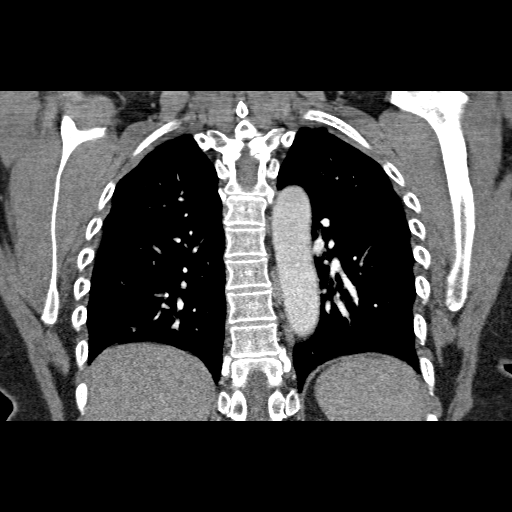

[18 of 46 positions shown; findings below may reference images not displayed]

FINDINGS: No filling defect is identified in the pulmonary arterial
tree to suggest pulmonary embolus.

A partially imaged a small hypodense nodule in the left thyroid
lobe, measuring up to 6 mm in diameter.

No aortic dissection observed.  Stent in the left anterior
descending coronary artery noted

Abnormal moderate pericardial effusion. Abnormal
stranding/infiltration of the pericardial adipose tissue.
Cardiomegaly noted with possible thinning of myocardium along the
cardiac apex and apical septal region.  No reversal of the
interventricular septal contour.

Small nonspecific hypodense lesion in segment 3 of the liver, 6 mm
in diameter, no change from 11/03/2011, likely benign.

Lungs appear clear.

There is an abnormal band of transverse sclerosis in the sternum.
This was not present on 11/03/2011.  Appearance raises concern for
possible sternal osteomyelitis or sternal fracture which is
healing.
IMPRESSION: 1.  No embolus identified.
2.  Abnormal new pericardial fluid, unusual abnormal stranding of
the pericardial fat, and an abnormal transverse band of sclerosis
in the mid sternum with anterior sternal lucent line in this band.
Appearance raises concern for sternal osteomyelitis with spread
into the pericardial space, or possibly a healing sternal stress
fracture from prior cardiopulmonary resuscitation with underlying
pericarditis.  Constrictive pericarditis is not excluded. Correlate
with clinical findings of tamponade and consider cardiac echo.
3.  Thinned myocardium along the left ventricular apex and apical-
septal region.

I discussed these findings by telephone with Dr. Bella Jumper, who
is covering this patient in the emergency department, at [DATE] p.m.
on 08/31/2012.

## 2015-02-02 ENCOUNTER — Other Ambulatory Visit: Payer: Self-pay | Admitting: Cardiovascular Disease

## 2015-02-03 NOTE — Telephone Encounter (Signed)
Please deny this prescription.  The pt has not been seen in our office since 2014 and she has relocated to [place].

## 2015-02-03 NOTE — Telephone Encounter (Signed)
Please advise on refill as patient has not been seen since 2014.

## 2015-02-11 ENCOUNTER — Other Ambulatory Visit: Payer: Self-pay | Admitting: Cardiovascular Disease
